# Patient Record
Sex: Male | Born: 1965 | Race: White | Hispanic: No | Marital: Single | Smoking: Never smoker
Health system: Southern US, Community
[De-identification: ages and names within clinical notes are randomized; demographics above are authoritative.]

## PROBLEM LIST (undated history)

## (undated) DIAGNOSIS — K219 Gastro-esophageal reflux disease without esophagitis: Secondary | ICD-10-CM

## (undated) DIAGNOSIS — Z789 Other specified health status: Secondary | ICD-10-CM

## (undated) DIAGNOSIS — N529 Male erectile dysfunction, unspecified: Secondary | ICD-10-CM

## (undated) DIAGNOSIS — E785 Hyperlipidemia, unspecified: Secondary | ICD-10-CM

## (undated) DIAGNOSIS — K5792 Diverticulitis of intestine, part unspecified, without perforation or abscess without bleeding: Secondary | ICD-10-CM

## (undated) DIAGNOSIS — F909 Attention-deficit hyperactivity disorder, unspecified type: Secondary | ICD-10-CM

## (undated) DIAGNOSIS — S3992XA Unspecified injury of lower back, initial encounter: Secondary | ICD-10-CM

## (undated) DIAGNOSIS — IMO0002 Reserved for concepts with insufficient information to code with codable children: Secondary | ICD-10-CM

## (undated) DIAGNOSIS — G4700 Insomnia, unspecified: Secondary | ICD-10-CM

## (undated) HISTORY — DX: Reserved for concepts with insufficient information to code with codable children: IMO0002

## (undated) HISTORY — DX: Hyperlipidemia, unspecified: E78.5

## (undated) HISTORY — DX: Insomnia, unspecified: G47.00

## (undated) HISTORY — DX: Other specified health status: Z78.9

## (undated) HISTORY — PX: KNEE SURGERY: SHX244

## (undated) HISTORY — DX: Diverticulitis of intestine, part unspecified, without perforation or abscess without bleeding: K57.92

## (undated) HISTORY — DX: Male erectile dysfunction, unspecified: N52.9

## (undated) HISTORY — PX: SHOULDER SURGERY: SHX246

## (undated) HISTORY — DX: Unspecified injury of lower back, initial encounter: S39.92XA

## (undated) HISTORY — PX: ESOPHAGOGASTRODUODENOSCOPY: SHX1529

## (undated) HISTORY — DX: Attention-deficit hyperactivity disorder, unspecified type: F90.9

## (undated) HISTORY — DX: Gastro-esophageal reflux disease without esophagitis: K21.9

---

## 2006-10-13 ENCOUNTER — Emergency Department (HOSPITAL_COMMUNITY): Admission: EM | Admit: 2006-10-13 | Discharge: 2006-10-13 | Payer: Self-pay | Admitting: Emergency Medicine

## 2007-05-27 ENCOUNTER — Encounter: Admission: RE | Admit: 2007-05-27 | Discharge: 2007-05-27 | Payer: Self-pay | Admitting: Gastroenterology

## 2007-12-26 ENCOUNTER — Ambulatory Visit (HOSPITAL_COMMUNITY): Admission: RE | Admit: 2007-12-26 | Discharge: 2007-12-26 | Payer: Self-pay | Admitting: Family Medicine

## 2011-03-27 ENCOUNTER — Emergency Department (HOSPITAL_COMMUNITY)
Admission: EM | Admit: 2011-03-27 | Discharge: 2011-03-27 | Disposition: A | Discharge status: Home / Self Care | Payer: 59 | Attending: Emergency Medicine | Admitting: Emergency Medicine

## 2011-03-27 ENCOUNTER — Other Ambulatory Visit: Payer: Self-pay

## 2011-03-27 ENCOUNTER — Ambulatory Visit (HOSPITAL_COMMUNITY)
Admission: RE | Admit: 2011-03-27 | Discharge: 2011-03-27 | Disposition: A | Discharge status: Home / Self Care | Payer: 59 | Source: Ambulatory Visit | Attending: Family Medicine | Admitting: Family Medicine

## 2011-03-27 ENCOUNTER — Emergency Department (HOSPITAL_COMMUNITY): Payer: 59

## 2011-03-27 ENCOUNTER — Other Ambulatory Visit: Payer: Self-pay | Admitting: Family Medicine

## 2011-03-27 DIAGNOSIS — R079 Chest pain, unspecified: Secondary | ICD-10-CM | POA: Insufficient documentation

## 2011-03-27 DIAGNOSIS — M549 Dorsalgia, unspecified: Secondary | ICD-10-CM | POA: Insufficient documentation

## 2011-03-27 LAB — DIFFERENTIAL
Basophils Absolute: 0 10*3/uL (ref 0.0–0.1)
Basophils Relative: 0 % (ref 0–1)
Eosinophils Absolute: 0.2 10*3/uL (ref 0.0–0.7)
Monocytes Absolute: 0.8 10*3/uL (ref 0.1–1.0)
Neutro Abs: 3.6 10*3/uL (ref 1.7–7.7)
Neutrophils Relative %: 57 % (ref 43–77)

## 2011-03-27 LAB — COMPREHENSIVE METABOLIC PANEL
AST: 25 U/L (ref 0–37)
Albumin: 3.8 g/dL (ref 3.5–5.2)
Chloride: 103 mEq/L (ref 96–112)
Creatinine, Ser: 0.78 mg/dL (ref 0.50–1.35)
Total Bilirubin: 0.3 mg/dL (ref 0.3–1.2)
Total Protein: 7.1 g/dL (ref 6.0–8.3)

## 2011-03-27 LAB — CARDIAC PANEL(CRET KIN+CKTOT+MB+TROPI)
Relative Index: 1.8 (ref 0.0–2.5)
Total CK: 378 U/L — ABNORMAL HIGH (ref 7–232)
Troponin I: <0.3 ng/mL (ref <0.30)

## 2011-03-27 LAB — CBC
HCT: 45 % (ref 39.0–52.0)
MCHC: 34 g/dL (ref 30.0–36.0)
RDW: 12.9 % (ref 11.5–15.5)

## 2011-03-27 MED ORDER — PANTOPRAZOLE SODIUM 20 MG PO TBEC: 20.0000 mg | DELAYED_RELEASE_TABLET | Freq: Every day | ORAL | Status: DC

## 2011-03-27 MED ORDER — IOHEXOL 350 MG/ML SOLN
100.0000 mL | Freq: Once | INTRAVENOUS | Status: AC | PRN
  Administered 2011-03-27: 100 mL via INTRAVENOUS

## 2011-03-27 NOTE — ED Notes (Signed)
Lt. Side chest pain for 7 days, mid  upper back pain also for 7 days, pt. Denies any sob n/v, skin is w/d/p, resp. E/ u,   The pain is described as aching and non-radiating

## 2011-03-27 NOTE — ED Notes (Signed)
Pt ambulated to the bathroom and back with no difficulty and no distress

## 2011-03-27 NOTE — ED Notes (Signed)
No previous EKG in our MUSE system

## 2011-03-27 NOTE — ED Provider Notes (Signed)
History     CSN: [id number]  Arrival date & time 03/27/11  1352   First MD Initiated Contact with Patient 03/27/11 1605      Chief Complaint  Patient presents with  . Chest Pain    (Consider location/radiation/quality/duration/timing/severity/associated sxs/prior treatment) Patient is a 45 y.o. male presenting with chest pain. The history is provided by the patient (Patient states that he has been having some chest pain that starts in the back radiates to the front of his chest off and on for a number of days. Patient states that it is not worse with exertion he does not get short of breath does not become sweaty some).  Chest Pain The chest pain began 1 - 2 weeks ago. Chest pain occurs intermittently. The chest pain is resolved. The pain is associated with lifting. At its most intense, the pain is at 2/10. The pain is currently at 0/10. The quality of the pain is described as aching. Radiates to: radiates back to front. Pertinent negatives for primary symptoms include no fever, no fatigue, no cough and no abdominal pain. He tried nothing for the symptoms.  Pertinent negatives for past medical history include no aneurysm and no seizures.     History reviewed. No pertinent past medical history.  History reviewed. No pertinent past surgical history.  History reviewed. No pertinent family history.  History  Substance Use Topics  . Smoking status: Never Smoker   . Smokeless tobacco: Not on file  . Alcohol Use: Yes      Review of Systems  Constitutional: Negative for fever and fatigue.  HENT: Negative for congestion, sinus pressure and ear discharge.   Eyes: Negative for discharge.  Respiratory: Negative for cough.   Cardiovascular: Positive for chest pain.  Gastrointestinal: Negative for abdominal pain and diarrhea.  Genitourinary: Negative for frequency and hematuria.  Musculoskeletal: Negative for back pain.  Skin: Negative for rash.  Neurological: Negative for seizures  and headaches.  Hematological: Negative.   Psychiatric/Behavioral: Negative for hallucinations.    Allergies  Review of patient's allergies indicates no known allergies.  Home Medications   Current Outpatient Rx  Name Route Sig Dispense Refill  . HYDROCODONE-ACETAMINOPHEN 5-325 MG PO TABS Oral Take 1 tablet by mouth every 6 (six) hours as needed. For back pain     . IBUPROFEN 200 MG PO TABS Oral Take 400 mg by mouth every 6 (six) hours as needed. For back pain     . PANTOPRAZOLE SODIUM 20 MG PO TBEC Oral Take 1 tablet (20 mg total) by mouth daily. 30 tablet 0    BP 133/80  Pulse 70  Temp(Src) 98.2 F (36.8 C) (Oral)  Resp 18  Ht 6\' 2"  (1.88 m)  Wt 230 lb (104.327 kg)  BMI 29.53 kg/m2  SpO2 96%  Physical Exam  Constitutional: He is oriented to person, place, and time. He appears well-developed.  HENT:  Head: Normocephalic and atraumatic.  Eyes: Conjunctivae and EOM are normal. No scleral icterus.  Neck: Neck supple. No thyromegaly present.  Cardiovascular: Normal rate and regular rhythm.  Exam reveals no gallop and no friction rub.   No murmur heard. Pulmonary/Chest: No stridor. He has no wheezes. He has no rales. He exhibits no tenderness.  Abdominal: He exhibits no distension. There is no tenderness. There is no rebound.  Musculoskeletal: Normal range of motion. He exhibits no edema.  Lymphadenopathy:    He has no cervical adenopathy.  Neurological: He is oriented to person, place, and time. Coordination  normal.  Skin: No rash noted. No erythema.  Psychiatric: He has a normal mood and affect. His behavior is normal.    ED Course  Procedures (including critical care time)  Labs Reviewed  CARDIAC PANEL(CRET KIN+CKTOT+MB+TROPI) - Abnormal; Notable for the following:    Total CK 378 (*)    CK, MB 6.9 (*)    All other components within normal limits  DIFFERENTIAL - Abnormal; Notable for the following:    Monocytes Relative 13 (*)    All other components within  normal limits  COMPREHENSIVE METABOLIC PANEL - Abnormal; Notable for the following:    BUN 26 (*)    All other components within normal limits  CBC   Dg Chest 2 View  03/27/2011  *RADIOLOGY REPORT*  Clinical Data: Chest pain.  CHEST - 2 VIEW  Comparison: None.  Findings: The heart size is normal.  The lungs are clear.  The patient is status post right shoulder surgery.  The visualized soft tissues and bony thorax are otherwise unremarkable.  IMPRESSION: Negative chest.  Original Report Authenticated By: Jamesetta Orleans. MATTERN, M.D.   Ct Angio Chest W/cm &/or Wo Cm  03/27/2011  *RADIOLOGY REPORT*  Clinical Data:  Rule out aortic aneurysm or dissection upper back pain, chest pain  CT ANGIOGRAPHY CHEST WITH CONTRAST  Technique:  Multidetector CT imaging of the chest was performed using the standard protocol during bolus administration of intravenous contrast.  Multiplanar CT image reconstructions including MIPs were obtained to evaluate the vascular anatomy.  Contrast: OMNIPAQUE IOHEXOL 350 MG/ML IV SOLN  Comparison:   Site of service.  Findings:  Unenhanced images of the thorax shows no atherosclerotic calcifications.  There is no pneumothorax. Central airways are patent.  Sagittal images of the spine are unremarkable.  Sagittal view of the sternum is unremarkable.  Post contrast images shows no evidence of aortic dissection.  Ascending aorta measures 3.1 cm in diameter.  Descending measures 2.3 cm in diameter.  The central pulmonary artery is unremarkable.  Heart size within normal limits.  No pericardial effusion is noted.  Images of the lung parenchyma shows no acute infiltrate or pulmonary edema.  Minimal posterior dependent atelectasis noted.  No mediastinal hematoma or adenopathy.  The visualized upper abdomen is unremarkable.  The visualized upper abdominal aorta is unremarkable.  Review of the MIP images confirms the above findings.  IMPRESSION:  1.  No evidence of aortic dissection.  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  Descending aorta measures 2.3 cm in diameter. 2.  No mediastinal hematoma or adenopathy. 3.  No acute infiltrate or pulmonary edema.  Original Report Authenticated By: Natasha Mead, M.D.     1. Chest pain     Date: 03/27/2011  Rate:88  Rhythm: normal sinus rhythm  QRS Axis: normal  Intervals: normal  ST/T Wave abnormalities: normal  Conduction Disutrbances:none  Narrative Interpretation:   Old EKG Reviewed: none available     MDM  Chest pain non cardiac,  Pud, pleuritis from uri, muscular skeletal        Benny Lennert, MD 03/27/11 1902

## 2011-04-08 ENCOUNTER — Encounter (HOSPITAL_COMMUNITY): Payer: Self-pay | Admitting: Family Medicine

## 2011-04-08 ENCOUNTER — Ambulatory Visit (HOSPITAL_COMMUNITY)
Admission: RE | Admit: 2011-04-08 | Discharge: 2011-04-08 | Disposition: A | Discharge status: Home / Self Care | Payer: 59 | Source: Ambulatory Visit | Attending: Family Medicine | Admitting: Family Medicine

## 2011-04-08 DIAGNOSIS — R079 Chest pain, unspecified: Secondary | ICD-10-CM | POA: Insufficient documentation

## 2011-04-08 NOTE — Progress Notes (Signed)
Stress Lab Nurses Notes - [facility]  Jeiden Daughtridge 04/08/2011  Reason for doing test: Chest Pain Type of test: Regular GTX Nurse performing test: Parke Poisson, RN Nuclear Medicine Tech: Not Applicable Echo Tech: Not Applicable MD performing test: Lubertha South Family MD: Lubertha South Test explained and consent signed: yes IV started: No IV started Symptoms: Fatigue Treatment/Intervention: None Reason test stopped: reached target HR After recovery IV was: No IV Patient to return to Nuc. Med at : NA Patient discharged: Home Patient's Condition upon discharge was: stable Comments: During test peak BP 168/72 & HR 171.  Recovery BP 140/68 & HR 110.  Symptoms resolved in recovery. Erskine Speed T

## 2011-04-14 NOTE — Procedures (Signed)
NAMETAEVYN, HAUSEN             ACCOUNT NO.:  [id number]  MEDICAL RECORD NO.:  [id number]  LOCATION:  [facility]                          FACILITY:  [facility]  PHYSICIAN:  Donna Bernard, M.D.DATE OF BIRTH:  Jun 27, 1965  DATE OF PROCEDURE:  04/08/2011 DATE OF DISCHARGE:  04/08/2011                                 STRESS TEST   INDICATIONS FOR TEST:  Test was performed on this 46 year old gentleman because of atypical chest discomfort.  He had actually been seen in the emergency room, and had had negative cardiac enzymes in telemetry. Stress test was performed for further risk stratification.  Stress test was performed at standard [facility] protocol.  Resting EKG revealed normal sinus rhythm with no significant ST-T changes.  The patient tolerated the first 4 stages quite well.  In fact, he made it to the fifth stage before reaching 95% of his maximum predicted heart rate. At this point, a 0.08 seconds past the J-point, there were no significant ST changes noted.  Of note, the patient had no chest pain. He had a normal hypertensive response as expected.  IMPRESSION:  Negative adequate stress test.  PLAN:  Regular exercise.  No further cardiac workup.  Follow up in the office as needed and as scheduled.     Donna Bernard, M.D.     WSL/MEDQ  D:  04/14/2011  T:  04/14/2011  Job:  [id number]

## 2012-02-12 ENCOUNTER — Ambulatory Visit (INDEPENDENT_AMBULATORY_CARE_PROVIDER_SITE_OTHER): Payer: 59 | Admitting: Urgent Care

## 2012-02-12 ENCOUNTER — Encounter: Payer: Self-pay | Admitting: Urgent Care

## 2012-02-12 VITALS — BP 129/88 | HR 81 | Temp 98.1°F | Ht 74.0 in | Wt 214.8 lb

## 2012-02-12 DIAGNOSIS — K219 Gastro-esophageal reflux disease without esophagitis: Secondary | ICD-10-CM | POA: Insufficient documentation

## 2012-02-12 DIAGNOSIS — R0789 Other chest pain: Secondary | ICD-10-CM

## 2012-02-12 MED ORDER — DEXLANSOPRAZOLE 60 MG PO CPDR: 60.0000 mg | DELAYED_RELEASE_CAPSULE | Freq: Every day | ORAL | Status: DC

## 2012-02-12 NOTE — Progress Notes (Signed)
Referring Provider: Merlyn Albert, MD Primary Care Physician:  Harlow Asa, MD Primary Gastroenterologist:  Dr. Jena Gauss  Chief Complaint  Patient presents with  . Gastrophageal Reflux    Atypical Chest Pain    HPI:  Nicholas Sharp is a 46 y.o. male here as a referral from Dr. Gerda Diss for atypical chest pain & GERD.  Pt gives hx of dx with GERD in 2009.  He has been having a pressure-like chest pain like an MI.  It is exacerbated by eating, drinking, or laying down at night.  Taking 8-9 Rolaids makes it better.  2 mo ago, he went to see Dr Gerda Diss & started carafate & protonix 40mg  daily.  He has previously failed OTC ranitidine, omeprazole, & prilosec in conjunction with protonix.  At times he feels his heart is racing.  He says Dr Gerda Diss ordered a stress test & it was negative.  He is very active as Company secretary, exercises, tries to eat right.  Works 24 hr shifts.  Cut out alcohol & caffeine & made little difference.  Denies dysphagia or odynophagia.  His weight is stable.  His appetite is fine.  He denies constipation, diarrhea, rectal bleeding, melena.  He had an EGD 2009 by Dr Ewing Schlein with benign esophageal biopsy, prepyloric inflammation, & hiatal hernia. He also had ABD [place] 2009 that showed mild diffuse hepatocellular disease, likely fatty infiltration.  LFTs 2012 normal.  Past Medical History  Diagnosis Date  . GERD (gastroesophageal reflux disease)   . DDD (degenerative disc disease)     Past Surgical History  Procedure Date  . Shoulder surgery     x 3  left  . Knee surgery     x 3   right  . Esophagogastroduodenoscopy     Dr Magod-hiatal hernia, normal esophagus & esophageal bx, gastritis (no bx), normal D1/D2    Current Outpatient Prescriptions  Medication Sig Dispense Refill  . HYDROcodone-acetaminophen (NORCO) 5-325 MG per tablet Take 1 tablet by mouth every 6 (six) hours as needed. For back pain      . ibuprofen (ADVIL,MOTRIN) 200 MG tablet Take 400 mg by mouth every 6 (six) hours  as needed. For back pain       . pantoprazole (PROTONIX) 20 MG tablet Take 1 tablet (20 mg total) by mouth daily.  30 tablet  0  . sucralfate (CARAFATE) 1 GM/10ML suspension Take 1 g by mouth 4 (four) times daily.      Marland Kitchen dexlansoprazole (DEXILANT) 60 MG capsule Take 1 capsule (60 mg total) by mouth daily.  14 capsule  0    Allergies as of 02/12/2012  . (No Known Allergies)    Family History:There is no known family history of colorectal carcinoma , liver disease, or inflammatory bowel disease.  Problem Relation Age of Onset  . Diabetes Mother   . Dementia Mother     History   Social History  . Marital Status: Single    Spouse Name: N/A    Number of Children: 1  . Years of Education: N/A   Occupational History  . fireman-    Social History Main Topics  . Smoking status: Never Smoker   . Smokeless tobacco: Not on file  . Alcohol Use: Yes     Comment: several beers/wk  . Drug Use: No  . Sexually Active: Not on file   Other Topics Concern  . Not on file   Social History Narrative   1 son-24 healthyLives alone  Review of Systems: Gen:  Denies any fever, chills, sweats, anorexia, fatigue, weakness, malaise, weight loss, and sleep disorder CV: Denies chest pain, angina, palpitations, syncope, orthopnea, PND, peripheral edema, and claudication. Resp: Denies dyspnea at rest, dyspnea with exercise, cough, sputum, wheezing, coughing up blood, and pleurisy. GI: Denies vomiting blood, jaundice, and fecal incontinence.  GU : Denies urinary burning, blood in urine, urinary frequency, urinary hesitancy, nocturnal urination, and urinary incontinence. MS: Denies joint pain, limitation of movement, and swelling, stiffness, low back pain, extremity pain. Denies muscle weakness, cramps, atrophy.  Derm: Denies rash, itching, dry skin, hives, moles, warts, or unhealing ulcers.  Psych: Denies depression, anxiety, memory loss, suicidal ideation, hallucinations, paranoia, and  confusion. Heme: Denies bruising, bleeding, and enlarged lymph nodes. Neuro:  Denies any headaches, dizziness, paresthesias. Endo:  Denies any problems with DM, thyroid, adrenal function.  Physical Exam: BP 129/88  Pulse 81  Temp 98.1 F (36.7 C) (Temporal)  Ht 6\' 2"  (1.88 m)  Wt 214 lb 12.8 oz (97.433 kg)  BMI 27.58 kg/m2 No LMP for male patient. General:   Alert,  Well-developed, well-nourished, pleasant and cooperative in NAD Head:  Normocephalic and atraumatic. Eyes:  Sclera clear, no icterus.   Conjunctiva pink. Ears:  Normal auditory acuity. Nose:  No deformity, discharge, or lesions. Mouth:  No deformity or lesions,oropharynx pink & moist. Neck:  Supple; no masses or thyromegaly. Lungs:  Clear throughout to auscultation.   No wheezes, crackles, or rhonchi. No acute distress. Heart:  Regular rate and rhythm; no murmurs, clicks, rubs,  or gallops. Abdomen:  Normal bowel sounds.  No bruits.  Soft, non-tender and non-distended without masses, hepatosplenomegaly or hernias noted.  No guarding or rebound tenderness.  Negative Murphy's sign. Rectal:  Deferred. Msk:  Symmetrical without gross deformities. Normal posture. Pulses:  Normal pulses noted. Extremities:  No clubbing or edema. Neurologic:  Alert and oriented x4;  grossly normal neurologically. Skin:  Intact without significant lesions or rashes. Lymph Nodes:  No significant cervical adenopathy. Psych:  Alert and cooperative. Normal mood and affect.

## 2012-02-12 NOTE — Assessment & Plan Note (Addendum)
Nicholas Sharp is a pleasant 46 y.o. male with atypical chest pain & minimal response to multiple PPIs.  EGD by Dr Jena Gauss to look for complicated GERD/esophagitis, h pylori.  Cannot r/o non-GI source.  I have discussed risks & benefits which include, but are not limited to, bleeding, infection, perforation & drug reaction.  The patient agrees with this plan & written consent will be obtained.  Cardiac work-up negative.    Begin Dexilant 60mg  daily Stop protonix & omeprazole Use GERD diet & precautions

## 2012-02-12 NOTE — Patient Instructions (Addendum)
Begin Dexilant 60mg  daily Stop protonix & omeprazole Use GERD diet & precautions EGD (Upper endoscopy) with Dr Jena Gauss as soon as possible Gastroesophageal Reflux Disease, Adult Gastroesophageal reflux disease (GERD) happens when acid from your stomach goes into your food pipe (esophagus). The acid can cause a burning feeling in your chest. Over time, the acid can make small holes (ulcers) in your food pipe.  HOME CARE  Ask your doctor for advice about:  Losing weight.  Quitting smoking.  Alcohol use.  Avoid foods and drinks that make your problems worse. You may want to avoid:  Caffeine and alcohol.  Chocolate.  Mints.  Garlic and onions.  Spicy foods.  Citrus fruits, such as oranges, lemons, or limes.  Foods that contain tomato, such as sauce, chili, salsa, and pizza.  Fried and fatty foods.  Avoid lying down for 3 hours before you go to bed or before you take a nap.  Eat small meals often, instead of large meals.  Wear loose-fitting clothing. Do not wear anything tight around your waist.  Raise (elevate) the head of your bed 6 to 8 inches with wood blocks. Using extra pillows does not help.  Only take medicines as told by your doctor.  Do not take aspirin or ibuprofen. GET HELP RIGHT AWAY IF:   You have pain in your arms, neck, jaw, teeth, or back.  Your pain gets worse or changes.  You feel sick to your stomach (nauseous), throw up (vomit), or sweat (diaphoresis).  You feel short of breath, or you pass out (faint).  Your throw up is green, yellow, black, or looks like coffee grounds or blood.  Your poop (stool) is red, bloody, or black. MAKE SURE YOU:   Understand these instructions.  Will watch your condition.  Will get help right away if you are not doing well or get worse. Document Released: 09/02/2007 Document Revised: 06/08/2011 Document Reviewed: 10/03/2010 [facility] Patient Information 2013 [place], [place]. Diet for Gastroesophageal Reflux  Disease, Adult Reflux (acid reflux) is when acid from your stomach flows up into the esophagus. When acid comes in contact with the esophagus, the acid causes irritation and soreness (inflammation) in the esophagus. When reflux happens often or so severely that it causes damage to the esophagus, it is called gastroesophageal reflux disease (GERD). Nutrition therapy can help ease the discomfort of GERD. FOODS OR DRINKS TO AVOID OR LIMIT  Smoking or chewing tobacco. Nicotine is one of the most potent stimulants to acid production in the gastrointestinal tract.  Caffeinated and decaffeinated coffee and black tea.  Regular or low-calorie carbonated beverages or energy drinks (caffeine-free carbonated beverages are allowed).   Strong spices, such as black pepper, white pepper, red pepper, cayenne, curry powder, and chili powder.  Peppermint or spearmint.  Chocolate.  High-fat foods, including meats and fried foods. Extra added fats including oils, butter, salad dressings, and nuts. Limit these to less than 8 tsp per day.  Fruits and vegetables if they are not tolerated, such as citrus fruits or tomatoes.  Alcohol.  Any food that seems to aggravate your condition. If you have questions regarding your diet, call your caregiver or a registered dietitian. OTHER THINGS THAT MAY HELP GERD INCLUDE:   Eating your meals slowly, in a relaxed setting.  Eating 5 to 6 small meals per day instead of 3 large meals.  Eliminating food for a period of time if it causes distress.  Not lying down until 3 hours after eating a meal.  Keeping the  head of your bed raised 6 to 9 inches (15 to 23 cm) by using a foam wedge or blocks under the legs of the bed. Lying flat may make symptoms worse.  Being physically active. Weight loss may be helpful in reducing reflux in overweight or obese adults.  Wear loose fitting clothing EXAMPLE MEAL PLAN This meal plan is approximately 2,000 calories based on  https://www.bernard.org/ meal planning guidelines. Breakfast   cup cooked oatmeal.  1 cup strawberries.  1 cup low-fat milk.  1 oz almonds. Snack  1 cup cucumber slices.  6 oz yogurt (made from low-fat or fat-free milk). Lunch  2 slice whole-wheat bread.  2 oz sliced [place].  2 tsp mayonnaise.  1 cup blueberries.  1 cup snap peas. Snack  6 whole-wheat crackers.  1 oz string cheese. Dinner   cup brown rice.  1 cup mixed veggies.  1 tsp olive oil.  3 oz grilled fish. Document Released: 03/16/2005 Document Revised: 06/08/2011 Document Reviewed: 01/30/2011 [facility] Patient Information 2013 [place], [place].

## 2012-02-15 ENCOUNTER — Encounter (HOSPITAL_COMMUNITY): Payer: Self-pay | Admitting: Pharmacy Technician

## 2012-02-15 NOTE — Progress Notes (Signed)
Faxed to PCP

## 2012-02-15 NOTE — Progress Notes (Signed)
REVIEWED.  

## 2012-02-16 MED ORDER — SODIUM CHLORIDE 0.45 % IV SOLN: INTRAVENOUS | Status: DC

## 2012-02-17 ENCOUNTER — Encounter (HOSPITAL_COMMUNITY): Payer: Self-pay

## 2012-02-17 ENCOUNTER — Ambulatory Visit (HOSPITAL_COMMUNITY)
Admission: RE | Admit: 2012-02-17 | Discharge: 2012-02-17 | Disposition: A | Discharge status: Home / Self Care | Payer: 59 | Source: Ambulatory Visit | Attending: Internal Medicine | Admitting: Internal Medicine

## 2012-02-17 ENCOUNTER — Encounter (HOSPITAL_COMMUNITY)
Admission: RE | Disposition: A | Discharge status: Home / Self Care | Payer: Self-pay | Source: Ambulatory Visit | Attending: Internal Medicine

## 2012-02-17 DIAGNOSIS — K2100 Gastro-esophageal reflux disease with esophagitis, without bleeding: Secondary | ICD-10-CM

## 2012-02-17 DIAGNOSIS — R0789 Other chest pain: Secondary | ICD-10-CM | POA: Insufficient documentation

## 2012-02-17 DIAGNOSIS — K209 Esophagitis, unspecified without bleeding: Secondary | ICD-10-CM | POA: Insufficient documentation

## 2012-02-17 DIAGNOSIS — K449 Diaphragmatic hernia without obstruction or gangrene: Secondary | ICD-10-CM | POA: Insufficient documentation

## 2012-02-17 DIAGNOSIS — K219 Gastro-esophageal reflux disease without esophagitis: Secondary | ICD-10-CM | POA: Insufficient documentation

## 2012-02-17 HISTORY — PX: ESOPHAGOGASTRODUODENOSCOPY: SHX5428

## 2012-02-17 SURGERY — EGD (ESOPHAGOGASTRODUODENOSCOPY)
Anesthesia: Moderate Sedation

## 2012-02-17 MED ORDER — STERILE WATER FOR IRRIGATION IR SOLN
Status: DC | PRN
  Administered 2012-02-17: 15:00:00

## 2012-02-17 MED ORDER — MEPERIDINE HCL 100 MG/ML IJ SOLN
INTRAMUSCULAR | Status: AC
  Filled 2012-02-17: qty 2

## 2012-02-17 MED ORDER — MEPERIDINE HCL 100 MG/ML IJ SOLN
INTRAMUSCULAR | Status: DC | PRN
  Administered 2012-02-17 (×3): 50 mg via INTRAVENOUS

## 2012-02-17 MED ORDER — MEPERIDINE HCL 100 MG/ML IJ SOLN
INTRAMUSCULAR | Status: AC
  Filled 2012-02-17: qty 1

## 2012-02-17 MED ORDER — MIDAZOLAM HCL 5 MG/5ML IJ SOLN
INTRAMUSCULAR | Status: AC
  Filled 2012-02-17: qty 10

## 2012-02-17 MED ORDER — MIDAZOLAM HCL 5 MG/5ML IJ SOLN
INTRAMUSCULAR | Status: DC | PRN
  Administered 2012-02-17 (×3): 2 mg via INTRAVENOUS

## 2012-02-17 NOTE — Progress Notes (Signed)
Patient had 0.45%NS hanging. Received .

## 2012-02-17 NOTE — Op Note (Signed)
[facility] [address] [place], [address]   ENDOSCOPY PROCEDURE REPORT  PATIENT: Nicholas Sharp, Nicholas Sharp  MR#: [id number] BIRTHDATE: 1966/03/30 , 46  yrs. old GENDER: Male ENDOSCOPIST: R.  Roetta Sessions, MD [facility] REFERRED BY:  Simone Curia, M.D. / Self PROCEDURE DATE:  02/17/2012 PROCEDURE:     Diagnostic EGD  INDICATIONS:     GERD/atypical chest pain-refractory to Protonix and Carafate; prior negative cardiac workup. The above-mentioned symptoms now much improved with a 4 day course of Dexilant thus far.  INFORMED CONSENT:   The risks, benefits, limitations, alternatives and imponderables have been discussed.  The potential for biopsy, esophogeal dilation, etc. have also been reviewed.  Questions have been answered.  All parties agreeable.  Please see the history and physical in the medical record for more information.  MEDICATIONS:  Versed 6 mg IV and Demerol 150 mg IV in divided doses.  DESCRIPTION OF PROCEDURE:   The EG-[id number] ([id number])  endoscope was introduced through the mouth and advanced to the second portion of the duodenum without difficulty or limitations.  The mucosal surfaces were surveyed very carefully during advancement of the scope and upon withdrawal.  Retroflexion view of the proximal stomach and esophagogastric junction was performed.      FINDINGS: Multiple 1-2 mm mucosal breaks at the GE junction. No Barrett's esophagus. Stomach empty. 2 cm hiatal hernia; otherwise, the remainder of the gastric mucosa appeared normal. Pylorus patent. Normal first and second portion of the duodenum.  THERAPEUTIC / DIAGNOSTIC MANEUVERS PERFORMED:   COMPLICATIONS:  None  IMPRESSION: Tiny distal esophageal erosions consistent with mild erosive reflux esophagitis. Small hiatal hernia.  RECOMMENDATIONS:  Continue Dexilant 60 mg orally daily. Office followup with [place] in 4 months.    _______________________________ R. Roetta Sessions, MD FACP  [facility] eSigned:  R. Roetta Sessions, MD FACP [facility] 02/17/2012 3:55 PM     CC:

## 2012-02-17 NOTE — Interval H&P Note (Signed)
History and Physical Interval Note:  02/17/2012 3:25 PM  Nicholas Sharp  has presented today for surgery, with the diagnosis of Atypical Chest Pain and GERD  The various methods of treatment have been discussed with the patient and family. After consideration of risks, benefits and other options for treatment, the patient has consented to  Procedure(s) (LRB) with comments: ESOPHAGOGASTRODUODENOSCOPY (EGD) (N/A) - 3:45 as a surgical intervention .  The patient's history has been reviewed, patient examined, no change in status, stable for surgery.  I have reviewed the patient's chart and labs.  Questions were answered to the patient's satisfaction.     Eula Listen  Patient already much improved on a short course of Dexilant 60 mg orally daily. EGD today per plan.The risks, benefits, limitations, alternatives and imponderables have been reviewed with the patient. Potential for esophageal dilation, biopsy, etc. have also been reviewed.  Questions have been answered. All parties agreeable.

## 2012-02-17 NOTE — H&P (View-Only) (Signed)
Referring Provider: Luking, William S, MD Primary Care Physician:  LUKING,W S, MD Primary Gastroenterologist:  Dr. Rourk  Chief Complaint  Patient presents with  . Gastrophageal Reflux    Atypical Chest Pain    HPI:  Nicholas Sharp is a 46 y.o. male here as a referral from Dr. Luking for atypical chest pain & GERD.  Pt gives hx of dx with GERD in 2009.  He has been having a pressure-like chest pain like an MI.  It is exacerbated by eating, drinking, or laying down at night.  Taking 8-9 Rolaids makes it better.  2 mo ago, he went to see Dr Luking & started carafate & protonix 40mg daily.  He has previously failed OTC ranitidine, omeprazole, & prilosec in conjunction with protonix.  At times he feels his heart is racing.  He says Dr Luking ordered a stress test & it was negative.  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  Cut out alcohol & caffeine & made little difference.  Denies dysphagia or odynophagia.  His weight is stable.  His appetite is fine.  He denies constipation, diarrhea, rectal bleeding, melena.  He had an EGD 2009 by Dr Magod with benign esophageal biopsy, prepyloric inflammation, & hiatal hernia. He also had ABD US 2009 that showed mild diffuse hepatocellular disease, likely fatty infiltration.  LFTs 2012 normal.  Past Medical History  Diagnosis Date  . GERD (gastroesophageal reflux disease)   . DDD (degenerative disc disease)     Past Surgical History  Procedure Date  . Shoulder surgery     x 3  left  . Knee surgery     x 3   right  . Esophagogastroduodenoscopy     Dr Magod-hiatal hernia, normal esophagus & esophageal bx, gastritis (no bx), normal D1/D2    Current Outpatient Prescriptions  Medication Sig Dispense Refill  . HYDROcodone-acetaminophen (NORCO) 5-325 MG per tablet Take 1 tablet by mouth every 6 (six) hours as needed. For back pain      . ibuprofen (ADVIL,MOTRIN) 200 MG tablet Take 400 mg by mouth every 6 (six) hours  as needed. For back pain       . pantoprazole (PROTONIX) 20 MG tablet Take 1 tablet (20 mg total) by mouth daily.  30 tablet  0  . sucralfate (CARAFATE) 1 GM/10ML suspension Take 1 g by mouth 4 (four) times daily.      . dexlansoprazole (DEXILANT) 60 MG capsule Take 1 capsule (60 mg total) by mouth daily.  14 capsule  0    Allergies as of 02/12/2012  . (No Known Allergies)    Family History:There is no known family history of colorectal carcinoma , liver disease, or inflammatory bowel disease.  Problem Relation Age of Onset  . Diabetes Mother   . Dementia Mother     History   Social History  . Marital Status: Single    Spouse Name: N/A    Number of Children: 1  . Years of Education: N/A   Occupational History  . fireman-[place]    Social History Main Topics  . Smoking status: Never Smoker   . Smokeless tobacco: Not on file  . Alcohol Use: Yes     Comment: several beers/wk  . Drug Use: No  . Sexually Active: Not on file   Other Topics Concern  . Not on file   Social History Narrative   1 son-24 healthyLives alone  Review of Systems: Gen:   Denies any fever, chills, sweats, anorexia, fatigue, weakness, malaise, weight loss, and sleep disorder CV: Denies chest pain, angina, palpitations, syncope, orthopnea, PND, peripheral edema, and claudication. Resp: Denies dyspnea at rest, dyspnea with exercise, cough, sputum, wheezing, coughing up blood, and pleurisy. GI: Denies vomiting blood, jaundice, and fecal incontinence.  GU : Denies urinary burning, blood in urine, urinary frequency, urinary hesitancy, nocturnal urination, and urinary incontinence. MS: Denies joint pain, limitation of movement, and swelling, stiffness, low back pain, extremity pain. Denies muscle weakness, cramps, atrophy.  Derm: Denies rash, itching, dry skin, hives, moles, warts, or unhealing ulcers.  Psych: Denies depression, anxiety, memory loss, suicidal ideation, hallucinations, paranoia, and  confusion. Heme: Denies bruising, bleeding, and enlarged lymph nodes. Neuro:  Denies any headaches, dizziness, paresthesias. Endo:  Denies any problems with DM, thyroid, adrenal function.  Physical Exam: BP 129/88  Pulse 81  Temp 98.1 F (36.7 C) (Temporal)  Ht 6' 2" (1.88 m)  Wt 214 lb 12.8 oz (97.433 kg)  BMI 27.58 kg/m2 No LMP for male patient. General:   Alert,  Well-developed, well-nourished, pleasant and cooperative in NAD Head:  Normocephalic and atraumatic. Eyes:  Sclera clear, no icterus.   Conjunctiva pink. Ears:  Normal auditory acuity. Nose:  No deformity, discharge, or lesions. Mouth:  No deformity or lesions,oropharynx pink & moist. Neck:  Supple; no masses or thyromegaly. Lungs:  Clear throughout to auscultation.   No wheezes, crackles, or rhonchi. No acute distress. Heart:  Regular rate and rhythm; no murmurs, clicks, rubs,  or gallops. Abdomen:  Normal bowel sounds.  No bruits.  Soft, non-tender and non-distended without masses, hepatosplenomegaly or hernias noted.  No guarding or rebound tenderness.  Negative Murphy's sign. Rectal:  Deferred. Msk:  Symmetrical without gross deformities. Normal posture. Pulses:  Normal pulses noted. Extremities:  No clubbing or edema. Neurologic:  Alert and oriented x4;  grossly normal neurologically. Skin:  Intact without significant lesions or rashes. Lymph Nodes:  No significant cervical adenopathy. Psych:  Alert and cooperative. Normal mood and affect.  

## 2012-02-19 ENCOUNTER — Encounter (HOSPITAL_COMMUNITY): Payer: Self-pay | Admitting: Internal Medicine

## 2012-06-02 ENCOUNTER — Encounter: Payer: Self-pay | Admitting: *Deleted

## 2012-07-09 ENCOUNTER — Other Ambulatory Visit: Payer: Self-pay | Admitting: Family Medicine

## 2012-07-11 NOTE — Telephone Encounter (Signed)
Needs refill on testosterone inj.  Last chronic visit 07/27/12

## 2012-07-11 NOTE — Telephone Encounter (Signed)
May refill 

## 2012-07-11 NOTE — Telephone Encounter (Signed)
Med called into [facility] [place]

## 2012-08-01 ENCOUNTER — Ambulatory Visit: Payer: Self-pay | Admitting: Family Medicine

## 2012-08-01 ENCOUNTER — Encounter: Payer: Self-pay | Admitting: Family Medicine

## 2012-08-01 ENCOUNTER — Ambulatory Visit (INDEPENDENT_AMBULATORY_CARE_PROVIDER_SITE_OTHER): Payer: 59 | Admitting: Family Medicine

## 2012-08-01 ENCOUNTER — Other Ambulatory Visit: Payer: Self-pay | Admitting: Family Medicine

## 2012-08-01 VITALS — BP 142/88 | Temp 98.0°F | Wt 209.0 lb

## 2012-08-01 DIAGNOSIS — E291 Testicular hypofunction: Secondary | ICD-10-CM

## 2012-08-01 DIAGNOSIS — Z79899 Other long term (current) drug therapy: Secondary | ICD-10-CM

## 2012-08-01 DIAGNOSIS — Z125 Encounter for screening for malignant neoplasm of prostate: Secondary | ICD-10-CM

## 2012-08-01 DIAGNOSIS — E349 Endocrine disorder, unspecified: Secondary | ICD-10-CM

## 2012-08-01 DIAGNOSIS — E785 Hyperlipidemia, unspecified: Secondary | ICD-10-CM

## 2012-08-01 MED ORDER — CEFPROZIL 500 MG PO TABS: 500.0000 mg | ORAL_TABLET | Freq: Two times a day (BID) | ORAL | Status: DC

## 2012-08-01 NOTE — Telephone Encounter (Signed)
wis cannot e-rx

## 2012-08-01 NOTE — Progress Notes (Signed)
  Subjective:    Patient ID: Nicholas Sharp, male    DOB: 1966/01/09, 47 y.o.   MRN: [id number]  Headache  This is a new problem. The current episode started 1 to 4 weeks ago. The problem occurs every few minutes. The problem has been waxing and waning. The pain is located in the bilateral region. The pain does not radiate. The pain quality is not similar to prior headaches. The quality of the pain is described as aching and shooting. The pain is at a severity of 5/10. The pain is moderate. Associated symptoms include rhinorrhea. Pertinent negatives include no facial sweating. Nothing aggravates the symptoms. He has tried NSAIDs (advil two or three prn) for the symptoms. The treatment provided mild relief. There is no history of migraine headaches.   Exercising regularly. Using otc claritin for allergies. Patient notes he has a history of elevated blood pressure. Recent numbers have been good when he checks at work.  Patient on testosterone. He gives himself injections every 3 weeks. No recent blood work. No signs or symptoms of high or low testosterone.  Review of Systems  HENT: Positive for rhinorrhea.   Neurological: Positive for headaches.   ROS otherwise negative    Objective:   Physical Exam  Alert no acute distress. Blood pressure improved on repeat 130/86. HEENT normal. Lungs clear. Heart regular in rhythm. Funduscopic exam normal. Neck supple. Scalp nontender      Assessment & Plan:  Impression headaches patient worried about brain tumor told these are benign and not suggestive at all of brain tumor. #2 low testosterone status uncertain. #3 hypertension decent control. Plan appropriate blood work. Diet exercise iscussed. Symptomatic care discussed. 25 minutes spent most indiscusson.

## 2012-08-02 ENCOUNTER — Encounter: Payer: Self-pay | Admitting: *Deleted

## 2012-08-12 LAB — BASIC METABOLIC PANEL
BUN: 21 mg/dL (ref 6–23)
CO2: 31 mEq/L (ref 19–32)
Calcium: 9.3 mg/dL (ref 8.4–10.5)
Creat: 0.9 mg/dL (ref 0.50–1.35)
Glucose, Bld: 87 mg/dL (ref 70–99)

## 2012-08-12 LAB — LIPID PANEL
Cholesterol: 174 mg/dL (ref 0–200)
HDL: 41 mg/dL (ref >39)
Triglycerides: 92 mg/dL (ref <150)

## 2012-08-12 LAB — HEPATIC FUNCTION PANEL
Albumin: 4.4 g/dL (ref 3.5–5.2)
Total Protein: 6.5 g/dL (ref 6.0–8.3)

## 2012-08-14 ENCOUNTER — Encounter: Payer: Self-pay | Admitting: Family Medicine

## 2012-10-12 ENCOUNTER — Other Ambulatory Visit: Payer: Self-pay | Admitting: Internal Medicine

## 2012-10-12 NOTE — Telephone Encounter (Signed)
He needs f/u OV in 3 months.

## 2012-10-13 NOTE — Telephone Encounter (Signed)
3 month f/u ov reminder made

## 2012-10-19 ENCOUNTER — Other Ambulatory Visit: Payer: Self-pay | Admitting: Family Medicine

## 2012-10-19 NOTE — Telephone Encounter (Signed)
Last seen 08/01/12.

## 2012-10-19 NOTE — Telephone Encounter (Signed)
May ref plus 2 ref 

## 2012-11-22 ENCOUNTER — Encounter: Payer: Self-pay | Admitting: Family Medicine

## 2012-11-22 ENCOUNTER — Ambulatory Visit (INDEPENDENT_AMBULATORY_CARE_PROVIDER_SITE_OTHER): Payer: 59 | Admitting: Family Medicine

## 2012-11-22 VITALS — BP 130/80 | Temp 97.5°F | Ht 74.0 in | Wt 209.4 lb

## 2012-11-22 DIAGNOSIS — J209 Acute bronchitis, unspecified: Secondary | ICD-10-CM

## 2012-11-22 MED ORDER — CLARITHROMYCIN 500 MG PO TABS: 500.0000 mg | ORAL_TABLET | Freq: Two times a day (BID) | ORAL | Status: AC

## 2012-11-22 NOTE — Progress Notes (Signed)
  Subjective:    Patient ID: Nicholas Sharp, male    DOB: 1965-06-18, 47 y.o.   MRN: [id number]  Cough This is a new problem. The current episode started in the past 7 days. The problem has been gradually worsening. The cough is productive of purulent sputum. Associated symptoms include chills, ear congestion, a fever and nasal congestion. Pertinent negatives include no headaches. Nothing aggravates the symptoms. The treatment provided moderate relief.   Prod cough, worse in the morning, productive at times   Review of Systems  Constitutional: Positive for fever and chills.  Respiratory: Positive for cough.   Neurological: Negative for headaches.       Objective:   Physical Exam  Alert mild malaise. Pharynx erythematous neck supple. Intermittent cough during exam heart regular rate rhythm bronchial cough noted no crackles no wheezes      Assessment & Plan:  Impression acute pharyngitis and bronchitis. Plan Biaxin twice a day 10 days. Symptomatic care discussed. Hycodan when necessary for cough. Of note macrolide given because of the others in an extended community with walking pneumonia. WSL

## 2012-12-16 ENCOUNTER — Other Ambulatory Visit: Payer: Self-pay | Admitting: Family Medicine

## 2012-12-19 NOTE — Telephone Encounter (Signed)
wis plus 6 ref

## 2012-12-28 ENCOUNTER — Encounter: Payer: Self-pay | Admitting: Internal Medicine

## 2012-12-29 ENCOUNTER — Ambulatory Visit (INDEPENDENT_AMBULATORY_CARE_PROVIDER_SITE_OTHER): Payer: 59 | Admitting: Family Medicine

## 2012-12-29 ENCOUNTER — Encounter: Payer: Self-pay | Admitting: Family Medicine

## 2012-12-29 VITALS — BP 132/90 | Temp 98.1°F | Ht 74.0 in | Wt 205.0 lb

## 2012-12-29 DIAGNOSIS — K219 Gastro-esophageal reflux disease without esophagitis: Secondary | ICD-10-CM

## 2012-12-29 DIAGNOSIS — E291 Testicular hypofunction: Secondary | ICD-10-CM

## 2012-12-29 DIAGNOSIS — L299 Pruritus, unspecified: Secondary | ICD-10-CM

## 2012-12-29 DIAGNOSIS — R7989 Other specified abnormal findings of blood chemistry: Secondary | ICD-10-CM

## 2012-12-29 DIAGNOSIS — E349 Endocrine disorder, unspecified: Secondary | ICD-10-CM

## 2012-12-29 DIAGNOSIS — Z79899 Other long term (current) drug therapy: Secondary | ICD-10-CM

## 2012-12-29 DIAGNOSIS — Z0000 Encounter for general adult medical examination without abnormal findings: Secondary | ICD-10-CM

## 2012-12-29 DIAGNOSIS — E785 Hyperlipidemia, unspecified: Secondary | ICD-10-CM

## 2012-12-29 MED ORDER — TESTOSTERONE CYPIONATE 200 MG/ML IM SOLN: INTRAMUSCULAR | Status: DC

## 2012-12-29 NOTE — Progress Notes (Signed)
  Subjective:    Patient ID: Nicholas Sharp, male    DOB: 1965-05-13, 47 y.o.   MRN: [id number]  HPIneck itching for 2 weeks. Can't sleep at night due to the itching. Has had this before years ago. Has tried otc. No major rash.  Some itching., Use prescription cream in the past which definitely seemed to help.  Stress significant--a lot going on, sister had a heart attack recently, on the go with a lot of stuff.    Discuss testosterone injection. Using the injections regularly. Feels like it is currently still helping him. Needs refills on this. No levels checked since the spring.  Patient worried about his cholesterol in light of his sister recently having a heart attack.  Reflexes generally stable.    Review of Systems no chest pain no back pain no abdominal pain ROS otherwise negative    Objective:   Physical Exam  Alert no acute distress. Lungs clear. Heart regular in rhythm. H&T posterior scalp couple small papules. No distinct rash.      Assessment & Plan:  Impression #1 scalp pruritus possibly related to #2 #2 increased stress. #3 low testosterone status uncertain. #4 hyperlipidemia status uncertain. #5 family history now of heart attack and sibling discussed with patient. Plan 25 minutes spent most in discussion. Triamcinolone cream. Appropriate blood work. Injections refilled. Diet exercise discussed. WSL

## 2013-03-02 ENCOUNTER — Telehealth: Payer: Self-pay | Admitting: Family Medicine

## 2013-03-02 ENCOUNTER — Other Ambulatory Visit: Payer: Self-pay | Admitting: *Deleted

## 2013-03-02 MED ORDER — ALPRAZOLAM 0.5 MG PO TABS: ORAL_TABLET | ORAL | Status: DC

## 2013-03-02 NOTE — Telephone Encounter (Signed)
[facility] in [place]. Pt notified.

## 2013-03-02 NOTE — Telephone Encounter (Signed)
Patient has experienced a family loss and is experiencing a lot of anxiety. He is hoping to have an Rx for xanax(because it has worked in the past) called in to [facility]. I told him he may need an appointment. [facility]

## 2013-03-02 NOTE — Telephone Encounter (Signed)
Xanax .5 thirty one po q 6 prn anxiety, no ref, drowsy prec

## 2013-03-17 ENCOUNTER — Telehealth: Payer: Self-pay | Admitting: Family Medicine

## 2013-03-17 MED ORDER — DIPHENOXYLATE-ATROPINE 2.5-0.025 MG PO TABS: 1.0000 | ORAL_TABLET | Freq: Four times a day (QID) | ORAL | Status: DC | PRN

## 2013-03-17 NOTE — Telephone Encounter (Signed)
Medication faxed to pharmacy. Patient was notified.  

## 2013-03-17 NOTE — Telephone Encounter (Signed)
Pt has had diarrhea x 2 days, not abd pains associated with it, no fever but felt a little achy yesterday but not today. Wants to know if we can call him in something for the diarrhea?  [facility] [place]

## 2013-03-17 NOTE — Telephone Encounter (Signed)
Lomotil 24 one q 6 prn will need to fax

## 2013-03-21 ENCOUNTER — Telehealth: Payer: Self-pay | Admitting: Family Medicine

## 2013-03-21 MED ORDER — HYDROCODONE-HOMATROPINE 5-1.5 MG/5ML PO SYRP: 5.0000 mL | ORAL_SOLUTION | Freq: Every evening | ORAL | Status: DC | PRN

## 2013-03-21 NOTE — Telephone Encounter (Signed)
4 oz one tspn qhs prn cough

## 2013-03-21 NOTE — Telephone Encounter (Signed)
Progress note says he got Hycodan 11/22/12  - will need directions if we are refilling.

## 2013-03-21 NOTE — Telephone Encounter (Signed)
Patient needs Rx for the cough syrup that he was given a few months ago. He said he has a bad cough at night.   [facility]

## 2013-03-21 NOTE — Telephone Encounter (Signed)
Rx printed and up front for patient pick up. Patient notified. 

## 2013-04-24 ENCOUNTER — Other Ambulatory Visit: Payer: Self-pay | Admitting: Family Medicine

## 2013-04-24 NOTE — Telephone Encounter (Signed)
Ok plus 3 ref 

## 2013-04-24 NOTE — Telephone Encounter (Signed)
Last office visit 12-29-12

## 2013-04-28 ENCOUNTER — Telehealth: Payer: Self-pay | Admitting: Family Medicine

## 2013-04-28 MED ORDER — AMPHETAMINE-DEXTROAMPHETAMINE 20 MG PO TABS: 20.0000 mg | ORAL_TABLET | Freq: Two times a day (BID) | ORAL | Status: DC

## 2013-04-28 NOTE — Telephone Encounter (Signed)
Patient needs Rx for Adderall. °

## 2013-04-28 NOTE — Telephone Encounter (Signed)
Ok times one mo--not seen since early oct, rec ov before another rx

## 2013-04-28 NOTE — Telephone Encounter (Signed)
Pt would like to restart adderall. States he usually starts back taking in January and that you know about his situation.

## 2013-05-01 NOTE — Telephone Encounter (Signed)
Patient notified

## 2013-05-04 ENCOUNTER — Encounter: Payer: Self-pay | Admitting: Family Medicine

## 2013-05-04 ENCOUNTER — Ambulatory Visit (INDEPENDENT_AMBULATORY_CARE_PROVIDER_SITE_OTHER): Payer: 59 | Admitting: Family Medicine

## 2013-05-04 VITALS — BP 134/98 | Ht 74.0 in | Wt 220.2 lb

## 2013-05-04 DIAGNOSIS — I10 Essential (primary) hypertension: Secondary | ICD-10-CM

## 2013-05-04 DIAGNOSIS — Z79899 Other long term (current) drug therapy: Secondary | ICD-10-CM

## 2013-05-04 DIAGNOSIS — E291 Testicular hypofunction: Secondary | ICD-10-CM

## 2013-05-04 DIAGNOSIS — F909 Attention-deficit hyperactivity disorder, unspecified type: Secondary | ICD-10-CM

## 2013-05-04 DIAGNOSIS — E349 Endocrine disorder, unspecified: Secondary | ICD-10-CM

## 2013-05-04 DIAGNOSIS — E785 Hyperlipidemia, unspecified: Secondary | ICD-10-CM

## 2013-05-04 MED ORDER — ENALAPRIL MALEATE 5 MG PO TABS: 5.0000 mg | ORAL_TABLET | Freq: Every day | ORAL | Status: DC

## 2013-05-04 NOTE — Progress Notes (Signed)
   Subjective:    Patient ID: Nicholas Sharp, male    DOB: 03/07/1966, 48 y.o.   MRN: [id number]  HPI Patient is here today because he is concerned he may have high blood pressure. His readings have been running high and his face stays flushed all the time. He also has pain,burning and pressure around his eyes. This has been present for over 1 year now.   BP not ck'rd elsewhere  Flushing at times, some headache  Sluggish in aft for about a yr or so  Reg exercise puny none  incr stress lately, lot of stress,  Reflux stable,  Patient arrives office with numerous concerns and lengthy discussions regarding them.  Long history of elevated blood pressure. Trying to watch his diet.  Admits to excessive alcohol intake.  Intermittent burning impression the eyes. Has not seen an eye doctor for quite some time. Tried a trial of Claritin and it did not help.  Reports occasional use of ADHD medicine particularly when having a focus for tests Melvern Bankeret cetera.  Reports regular use of testosterone. No obvious side effects. Did not get blood work is requested before.  Review of Systems Did chest pain no back pain no abdominal pain no change in bowel habits no blood in stool ROS otherwise negative    Objective:   Physical Exam  Alert HEENT normal blood pressure repeat 142/94 lungs clear. Heart regular in rhythm. Thyroid nonpalpable. Reflexes intact. Ankles without edema.      Assessment & Plan:  Impression #1 reflux discussed ongoing. #2 high concerns advised to see an oncologist. #3 hypo-testosterone is him discussed at length. Need to get the blood work. #4 hypertension type or treatment discuss. #5 ADHD ongoing medication prescribed. Plan 40 minutes spent most in discussion. Diet exercise discussed. Encouraged to cut down alcohol intake. Add Vasotec 5 each bedtime. See an eye doctor. Get the blood work. Further recommendations based results. WSL

## 2013-06-05 ENCOUNTER — Ambulatory Visit (INDEPENDENT_AMBULATORY_CARE_PROVIDER_SITE_OTHER): Payer: 59 | Admitting: Family Medicine

## 2013-06-05 ENCOUNTER — Encounter: Payer: Self-pay | Admitting: Family Medicine

## 2013-06-05 VITALS — BP 130/84 | Ht 74.0 in | Wt 218.4 lb

## 2013-06-05 DIAGNOSIS — R21 Rash and other nonspecific skin eruption: Secondary | ICD-10-CM

## 2013-06-05 LAB — BASIC METABOLIC PANEL
BUN: 20 mg/dL (ref 6–23)
CALCIUM: 9.1 mg/dL (ref 8.4–10.5)
CO2: 31 meq/L (ref 19–32)
CREATININE: 0.8 mg/dL (ref 0.50–1.35)
Chloride: 101 mEq/L (ref 96–112)
GLUCOSE: 89 mg/dL (ref 70–99)
Potassium: 4.3 mEq/L (ref 3.5–5.3)
SODIUM: 137 meq/L (ref 135–145)

## 2013-06-05 LAB — LIPID PANEL
CHOL/HDL RATIO: 5.4 ratio
CHOLESTEROL: 214 mg/dL — AB (ref 0–200)
HDL: 40 mg/dL (ref >39)
LDL Cholesterol: 127 mg/dL — ABNORMAL HIGH (ref 0–99)
Triglycerides: 236 mg/dL — ABNORMAL HIGH (ref <150)
VLDL: 47 mg/dL — ABNORMAL HIGH (ref 0–40)

## 2013-06-05 LAB — HEPATIC FUNCTION PANEL
ALBUMIN: 4.3 g/dL (ref 3.5–5.2)
ALK PHOS: 58 U/L (ref 39–117)
ALT: 64 U/L — AB (ref 0–53)
AST: 49 U/L — AB (ref 0–37)
BILIRUBIN DIRECT: 0.1 mg/dL (ref 0.0–0.3)
BILIRUBIN TOTAL: 0.6 mg/dL (ref 0.2–1.2)
Indirect Bilirubin: 0.5 mg/dL (ref 0.2–1.2)
Total Protein: 6.9 g/dL (ref 6.0–8.3)

## 2013-06-05 MED ORDER — HYDROCORTISONE 2.5 % EX CREA: TOPICAL_CREAM | Freq: Two times a day (BID) | CUTANEOUS | Status: DC

## 2013-06-05 NOTE — Progress Notes (Signed)
   Subjective:    Patient ID: Nicholas Sharp, male    DOB: 10/02/1965, 48 y.o.   MRN: [id number]  HPI Patient arrives for redness or rash on chest and stomach off and on for a year. Worse this year and having redness in face now also.  Patient states rash is annoying  Notes he also has had an eruption of acne since starting the testosterone. Days. Also rash on the  Review of Systems No fever no chills no headache no back pain no change in bowel habits ROS otherwise    Objective:   Physical Exam  Alert hydration good. Lungs clear. Heart regular in rhythm. H&T normal. Face diffusely flushed. Trunk multiple areas of acne diffuse flushing of chest with mild rash associated      Assessment & Plan:  Impression primarily flushing days ago dilatory rash. Sedation from both the testosterone and enalapril discussed plan await testosterone levels a strong earlier today. Further recommendations based results. WSL

## 2013-06-06 LAB — TESTOSTERONE: TESTOSTERONE: 476 ng/dL (ref 300–890)

## 2013-06-09 ENCOUNTER — Telehealth: Payer: Self-pay | Admitting: Family Medicine

## 2013-06-09 NOTE — Telephone Encounter (Signed)
Patient calling to get blood work results.   

## 2013-06-12 MED ORDER — VERAPAMIL HCL ER 180 MG PO TBCR: 180.0000 mg | EXTENDED_RELEASE_TABLET | Freq: Every day | ORAL | Status: DC

## 2013-06-12 NOTE — Addendum Note (Signed)
Addended by: Margaretha SheffieldBROWN, Niomi Valent S on: 06/12/2013 10:42 AM   Modules accepted: Orders

## 2013-06-12 NOTE — Addendum Note (Signed)
Addended by: Margaretha SheffieldBROWN, Karlton Maya S on: 06/12/2013 10:40 AM   Modules accepted: Orders

## 2013-06-12 NOTE — Telephone Encounter (Signed)
Nurse gave patient results

## 2013-07-12 ENCOUNTER — Other Ambulatory Visit: Payer: Self-pay | Admitting: Family Medicine

## 2013-07-12 NOTE — Telephone Encounter (Signed)
May ref times 6 

## 2013-07-13 ENCOUNTER — Telehealth: Payer: Self-pay | Admitting: Family Medicine

## 2013-07-13 MED ORDER — ALPRAZOLAM 0.5 MG PO TABS: 0.5000 mg | ORAL_TABLET | Freq: Four times a day (QID) | ORAL | Status: DC | PRN

## 2013-07-13 NOTE — Telephone Encounter (Signed)
May ref 

## 2013-07-13 NOTE — Telephone Encounter (Signed)
Patient is requesting a refill on his xanaxs. His mother is not expected to live too much longer and he thinks these will really help him out the next few days.  [facility]

## 2013-07-13 NOTE — Telephone Encounter (Signed)
Rx faxed to pharmacy. Patient notified. 

## 2013-07-13 NOTE — Telephone Encounter (Signed)
Patient not on Xanax per med list need strength and directions please.

## 2013-07-13 NOTE — Telephone Encounter (Signed)
Xanax .5 twenty eight one q 6 hrs prn , drowsy prec

## 2013-07-18 ENCOUNTER — Ambulatory Visit (INDEPENDENT_AMBULATORY_CARE_PROVIDER_SITE_OTHER): Payer: 59 | Admitting: Family Medicine

## 2013-07-18 ENCOUNTER — Encounter: Payer: Self-pay | Admitting: Family Medicine

## 2013-07-18 ENCOUNTER — Ambulatory Visit: Payer: 59 | Admitting: Family Medicine

## 2013-07-18 VITALS — BP 122/88 | Ht 74.0 in | Wt 217.0 lb

## 2013-07-18 DIAGNOSIS — F528 Other sexual dysfunction not due to a substance or known physiological condition: Secondary | ICD-10-CM

## 2013-07-18 DIAGNOSIS — K219 Gastro-esophageal reflux disease without esophagitis: Secondary | ICD-10-CM

## 2013-07-18 DIAGNOSIS — I10 Essential (primary) hypertension: Secondary | ICD-10-CM

## 2013-07-18 DIAGNOSIS — E349 Endocrine disorder, unspecified: Secondary | ICD-10-CM

## 2013-07-18 DIAGNOSIS — E291 Testicular hypofunction: Secondary | ICD-10-CM

## 2013-07-18 DIAGNOSIS — F909 Attention-deficit hyperactivity disorder, unspecified type: Secondary | ICD-10-CM

## 2013-07-18 DIAGNOSIS — R21 Rash and other nonspecific skin eruption: Secondary | ICD-10-CM

## 2013-07-18 MED ORDER — METRONIDAZOLE 1 % EX GEL: CUTANEOUS | Status: DC

## 2013-07-18 MED ORDER — ALPRAZOLAM 1 MG PO TABS: 1.0000 mg | ORAL_TABLET | Freq: Four times a day (QID) | ORAL | Status: DC | PRN

## 2013-07-18 NOTE — Patient Instructions (Signed)
Hold off on blood pressure medicine for now  We will work promptly on referral to dermatologist  Hold off on further testosterone shots for now

## 2013-07-18 NOTE — Progress Notes (Signed)
   Subjective:    Patient ID: Nicholas Sharp, male    DOB: 04/08/1965, 48 y.o.   MRN: [id number]  HPI Patient is here today for a f/u on his rash. He was seen here on 3/9 for same symptoms.  He said the rash is getting worst.   It is not itchy.  It is spreading to his chest, shoulder, and now face.   Pt wanted to let you know that his mom was just dx with MRSA, had it as a complication next  Patient has had significant anxiety since his mother just passed away this past week.  Patient states he has been off the blood pressure medication for the last few days. He notes change her blood pressure medicine did not help his rash at all.  Patient has had difficulty with alcohol abuse. He's worked on cutting down recently.  Patient has been on testosterone supplement for quite some time, has never developed a rash with it. Now he wonders if it is associated.  [phone]  Review of Systems No chest pain no headache no back pain no abdominal pain no change in bowel habits no blood in stool    Objective:   Physical Exam  Alert no acute distress. Lungs clear. Heart regular rate and rhythm. HEENT normal. Face and upper trunk impressive erythematous rash. Some nodular components in the face. Some folliculitis-type eruptions on the chest      Assessment & Plan:  Impression rash becoming chronic. Wonder if associated with testosterone. Quite possible discussed at length also wonder about rosacea equivalent with history of significant alcohol abuse. #2 hypertension decent control off meds. Discussed. #3 testosterone supplement Tacey RuizLeah states that discussed at length plan dermatology referral. Hold off on blood pressure medicine. Hold off on next testosterone shot due in 3 weeks. Recheck in 6 weeks testosterone level one week before then. WSL

## 2013-08-03 ENCOUNTER — Telehealth: Payer: Self-pay | Admitting: Family Medicine

## 2013-08-03 MED ORDER — METRONIDAZOLE 0.75 % EX GEL: 1.0000 "application " | Freq: Two times a day (BID) | CUTANEOUS | Status: DC

## 2013-08-03 NOTE — Telephone Encounter (Signed)
Pt's METRONIDAZOLE Topical 1% gel is not covered by plan, plan prefers METRONIDAZOLE gel 0.75% Janyth Contes(Finacea), please see denial from pharmacy, please advise

## 2013-08-03 NOTE — Telephone Encounter (Signed)
Medication sent to pharmacy  

## 2013-08-03 NOTE — Telephone Encounter (Signed)
Nurses please call in the medication that is preferred by his insurance plan apply twice daily may have a month's supply with 6 refills

## 2013-09-04 ENCOUNTER — Telehealth: Payer: Self-pay | Admitting: Family Medicine

## 2013-09-04 MED ORDER — ALPRAZOLAM 1 MG PO TABS: 1.0000 mg | ORAL_TABLET | Freq: Four times a day (QID) | ORAL | Status: DC | PRN

## 2013-09-04 NOTE — Telephone Encounter (Signed)
Med sent to pharm. Pt notified.  

## 2013-09-04 NOTE — Telephone Encounter (Signed)
He may refill this +3 additional refills

## 2013-09-04 NOTE — Telephone Encounter (Signed)
ALPRAZolam (XANAX) 1 MG tablet  Pt needs refill on his xanax as soon as possible He is currently having difficulty sleeping without it  Last prescribed/seen 07/18/13

## 2013-09-23 LAB — TESTOSTERONE: TESTOSTERONE: 260 ng/dL — AB (ref 300–890)

## 2013-10-10 ENCOUNTER — Other Ambulatory Visit: Payer: Self-pay | Admitting: Family Medicine

## 2013-10-10 NOTE — Telephone Encounter (Signed)
May do so +2 additional refills

## 2013-10-10 NOTE — Telephone Encounter (Signed)
Last seen 07/18/13 

## 2013-10-27 ENCOUNTER — Encounter: Payer: Self-pay | Admitting: Family Medicine

## 2013-10-27 ENCOUNTER — Ambulatory Visit (INDEPENDENT_AMBULATORY_CARE_PROVIDER_SITE_OTHER): Payer: 59 | Admitting: Family Medicine

## 2013-10-27 VITALS — BP 128/86 | Ht 74.0 in | Wt 222.0 lb

## 2013-10-27 DIAGNOSIS — E349 Endocrine disorder, unspecified: Secondary | ICD-10-CM

## 2013-10-27 DIAGNOSIS — E291 Testicular hypofunction: Secondary | ICD-10-CM

## 2013-10-27 DIAGNOSIS — I10 Essential (primary) hypertension: Secondary | ICD-10-CM

## 2013-10-27 DIAGNOSIS — F909 Attention-deficit hyperactivity disorder, unspecified type: Secondary | ICD-10-CM

## 2013-10-27 DIAGNOSIS — F902 Attention-deficit hyperactivity disorder, combined type: Secondary | ICD-10-CM

## 2013-10-27 DIAGNOSIS — R519 Headache, unspecified: Secondary | ICD-10-CM

## 2013-10-27 DIAGNOSIS — L719 Rosacea, unspecified: Secondary | ICD-10-CM

## 2013-10-27 DIAGNOSIS — K219 Gastro-esophageal reflux disease without esophagitis: Secondary | ICD-10-CM

## 2013-10-27 DIAGNOSIS — R51 Headache: Secondary | ICD-10-CM

## 2013-10-27 MED ORDER — TRIAMCINOLONE ACETONIDE 0.1 % EX CREA: 1.0000 "application " | TOPICAL_CREAM | Freq: Two times a day (BID) | CUTANEOUS | Status: DC

## 2013-10-27 NOTE — Progress Notes (Signed)
   Subjective:    Patient ID: Nicholas Sharp, male    DOB: 01/08/1966, 48 y.o.   MRN: [id number]  Hypertension This is a chronic problem. The current episode started more than 1 year ago. Risk factors for coronary artery disease include male gender.   Exercise regular, not taking the bp med   Right arm itching soon. Right arm  Saw the dermatologist dx'ed with rosacea. Gave sig abx. Worsen with sweating.   cont'd red spots on the abdomen,  Went to eye doc, pressure and pain and discpomfort... Felt that this was useless  Washed eyelids   Daily hits eyes and frontal head. Miserable with this, concern he may have sinus difficulties. Would like to see her nose and throat specialist  History of testosterone supplementation. Patient's been on for quite some time. See prior notes.  Review of Systems No chest pain no back pain no abdominal pain no change in bowel habits ROS otherwise    Objective:   Physical Exam  Alert no apparent distress. Lungs clear. Heart rare in rhythm. H&T and normal. Neck supple fundi discharge TMs normal. Skin maculopapular eruption on cheeks anterior chest      Assessment & Plan:  Impression 1 rosacea per dermatologist #2 hypertension good control without meds #3 testosterone supplement very long discussion held including recent studies. Patient wishes to hold off at this time. #4 ADHD stable #5 sinus concerns plan ENT referral. Hold off on testosterone. Of note blood pressure good without medicine. Diet exercise discussed. Followup as scheduled. 35-40 minutes spent most in discussion WSL

## 2013-10-29 DIAGNOSIS — L719 Rosacea, unspecified: Secondary | ICD-10-CM | POA: Insufficient documentation

## 2013-11-08 ENCOUNTER — Other Ambulatory Visit: Payer: Self-pay | Admitting: *Deleted

## 2013-11-08 ENCOUNTER — Telehealth: Payer: Self-pay | Admitting: Family Medicine

## 2013-11-08 NOTE — Telephone Encounter (Signed)
appt made with Dr. Avel Sensoreoh's PA august 20th at 9:30 am at 's office. Pt notified of appt.

## 2013-11-08 NOTE — Telephone Encounter (Signed)
Patient calling to check on referral to Dr. Suszanne Connerseoh for sinus headache.

## 2013-11-08 NOTE — Telephone Encounter (Signed)
Nurses, call today and let's do, ispent an enormous time covering many issues and apparently did not place this in ordeers

## 2013-11-17 ENCOUNTER — Encounter: Payer: Self-pay | Admitting: *Deleted

## 2013-11-21 ENCOUNTER — Other Ambulatory Visit (INDEPENDENT_AMBULATORY_CARE_PROVIDER_SITE_OTHER): Payer: Self-pay | Admitting: Otolaryngology

## 2013-11-21 DIAGNOSIS — J320 Chronic maxillary sinusitis: Secondary | ICD-10-CM

## 2013-11-29 ENCOUNTER — Other Ambulatory Visit: Payer: 59

## 2013-12-05 ENCOUNTER — Ambulatory Visit
Admission: RE | Admit: 2013-12-05 | Discharge: 2013-12-05 | Disposition: A | Discharge status: Home / Self Care | Payer: 59 | Source: Ambulatory Visit | Attending: Otolaryngology | Admitting: Otolaryngology

## 2013-12-05 DIAGNOSIS — J320 Chronic maxillary sinusitis: Secondary | ICD-10-CM

## 2013-12-11 ENCOUNTER — Encounter: Payer: Self-pay | Admitting: Family Medicine

## 2013-12-11 ENCOUNTER — Ambulatory Visit (INDEPENDENT_AMBULATORY_CARE_PROVIDER_SITE_OTHER): Payer: 59 | Admitting: Family Medicine

## 2013-12-11 VITALS — BP 116/88 | HR 72 | Temp 97.9°F | Resp 14 | Ht 74.0 in | Wt 224.0 lb

## 2013-12-11 DIAGNOSIS — R945 Abnormal results of liver function studies: Principal | ICD-10-CM

## 2013-12-11 DIAGNOSIS — E781 Pure hyperglyceridemia: Secondary | ICD-10-CM

## 2013-12-11 DIAGNOSIS — G4700 Insomnia, unspecified: Secondary | ICD-10-CM | POA: Insufficient documentation

## 2013-12-11 DIAGNOSIS — R7989 Other specified abnormal findings of blood chemistry: Secondary | ICD-10-CM

## 2013-12-11 DIAGNOSIS — Z789 Other specified health status: Secondary | ICD-10-CM | POA: Insufficient documentation

## 2013-12-11 DIAGNOSIS — E291 Testicular hypofunction: Secondary | ICD-10-CM

## 2013-12-11 MED ORDER — ALPRAZOLAM 1 MG PO TABS: ORAL_TABLET | ORAL | Status: DC

## 2013-12-11 NOTE — Progress Notes (Signed)
Subjective:    Patient ID: Nicholas Sharp, male    DOB: 08-26-1965, 48 y.o.   MRN: [id number]  HPI Patient is a very pleasant 48 year old white male who works as a Company secretary. I reviewed his most recent lab work from July. It was significant for mild elevations in his liver function tests. His AST and ALT were elevated in the 40s and 60s respectively. He also had elevated triglycerides greater than 358. I reviewed his past medical records which included an ultrasound of the abdomen performed at 2009 which showed mild diffuse hepatocellular disease consistent with fatty infiltration. Patient states that he drinks anywhere from 12-18 beers a day when he is not at work. His Cage questionnaire is completely negative. He does not believe that he has a problem with alcohol abuse. He is concerned that his liver function tests are slightly elevated. He also has a history of rosacea. He also has elevated triglycerides which could be due to his excessive alcohol use. He also reports a dark daily headache which is located above and behind the eyes. He recently had a CT scan of the sinuses which revealed mucous retention cyst of the maxillary sinus but was otherwise normal. The headaches are nonpulsatile. They tend to be worse later in the day. There is no photophobia or phonophobia. There no other neurologic deficits associated with the headaches. Past Medical History  Diagnosis Date  . GERD (gastroesophageal reflux disease)   . DDD (degenerative disc disease)   . ADHD (attention deficit hyperactivity disorder)   . Hyperlipidemia   . ED (erectile dysfunction)   . Insomnia   . Hypertension   . Reflux   . Anxiety   . Insomnia   . Alcohol consumption heavy    Past Surgical History  Procedure Laterality Date  . Shoulder surgery      x 3  left  . Knee surgery      x 3   right  . Esophagogastroduodenoscopy      Dr Magod-hiatal hernia, normal esophagus & esophageal bx, gastritis (no bx), normal D1/D2  .  Esophagogastroduodenoscopy  02/17/2012    Procedure: ESOPHAGOGASTRODUODENOSCOPY (EGD);  Surgeon: Corbin Ade, MD;  Location: [facility];  Service: Endoscopy;  Laterality: N/A;  3:45   No current outpatient prescriptions on file prior to visit.   No current facility-administered medications on file prior to visit.   No Known Allergies History   Social History  . Marital Status: Single    Spouse Name: N/A    Number of Children: 1  . 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 Smoking status: Never Smoker   . Smokeless tobacco: Never Used  . Alcohol Use: Yes     Comment: several beers/wk  . Drug Use: No  . Sexual Activity: Not on file   Other Topics Concern  . Not on file   Social History Narrative   1 son-24 healthy   Lives alone   Family History  Problem Relation Age of Onset  . Diabetes Mother   . Dementia Mother   . Heart disease Mother   . Hypertension Father   . Depression Sister       Review of Systems  All other systems reviewed and are negative.      Objective:   Physical Exam  Vitals reviewed. Constitutional: He appears well-developed and well-nourished. No distress.  Neck: Neck supple. No JVD present. No thyromegaly  present.  Cardiovascular: Normal rate, regular rhythm, normal heart sounds and intact distal pulses.  Exam reveals no gallop and no friction rub.   No murmur heard. Pulmonary/Chest: Effort normal and breath sounds normal. No respiratory distress. He has no wheezes. He has no rales.  Abdominal: Soft. Bowel sounds are normal. He exhibits no distension. There is no tenderness. There is no rebound and no guarding.  Musculoskeletal: He exhibits no edema.  Lymphadenopathy:    He has no cervical adenopathy.  Skin: He is not diaphoretic. There is erythema.          Assessment & Plan:  Elevated LFTs  Hypertriglyceridemia  Hypogonadism in male   We had a long  discussion today regarding his headaches, his hypertriglyceridemia, his mild elevation in his liver function tests, and his hypogonadism. I do believe his alcohol use is likely causing his liver irritation. It may be contributing to his hypogonadism as well as his rosacea. He may also be contributing to his hypertriglyceridemia. I recommended complete abstinence from alcohol for the next 3 months. In 3 months I like to check a fasting lipid panel, a CMP, and a testosterone level. I am not sure as to the cause of the patient's headaches. They could be muscle tension headaches. They could also be due to sinus pressure. Then maybe due to his excessive alcohol use.  Patient is scheduled to followup with ENT later today did review the results of the CT scan.  I like patient back in 3 months. He also reports insomnia. At the present time the right continue Xanax 1 mg by mouth each bedtime when necessary insomnia. He is concerned about possible addiction on his medication. However he is hesitant to change this medication and discontinued alcohol at the same time. Therefore in 3 months if he is able to wean himself away from alcohol, he would be interested in switching from Xanax to possibly trazodone or belsomra.  He has failed Ambien in the past

## 2013-12-29 ENCOUNTER — Telehealth: Payer: Self-pay | Admitting: Family Medicine

## 2013-12-29 NOTE — Telephone Encounter (Signed)
[phone]   Pt has been taking a xanax one a day most of time and he has noticed that if he takes one around 3 or 4 pm and then takes another around bed time that it works a lot better so is he wanting a rx for him to take it 2 times a day  [facility]

## 2014-01-02 ENCOUNTER — Ambulatory Visit
Admission: RE | Admit: 2014-01-02 | Discharge: 2014-01-02 | Disposition: A | Discharge status: Home / Self Care | Payer: 59 | Source: Ambulatory Visit | Attending: Orthopaedic Surgery | Admitting: Orthopaedic Surgery

## 2014-01-02 ENCOUNTER — Other Ambulatory Visit: Payer: Self-pay | Admitting: Orthopaedic Surgery

## 2014-01-02 DIAGNOSIS — M25511 Pain in right shoulder: Secondary | ICD-10-CM

## 2014-01-02 MED ORDER — IOHEXOL 300 MG/ML  SOLN
100.0000 mL | Freq: Once | INTRAMUSCULAR | Status: AC | PRN
  Administered 2014-01-02: 100 mL via INTRAVENOUS

## 2014-01-02 NOTE — Telephone Encounter (Signed)
Per [facility] - I am okay with giving him xanax twice a day for 1 month while he stops alcohol but I would NOT want to continue this long term due to the risk of habituation and dependency.

## 2014-01-03 MED ORDER — ALPRAZOLAM 1 MG PO TABS: 1.0000 mg | ORAL_TABLET | Freq: Two times a day (BID) | ORAL | Status: DC

## 2014-01-03 NOTE — Telephone Encounter (Signed)
Med called to pharm and pt aware 

## 2014-01-05 ENCOUNTER — Ambulatory Visit: Payer: 59 | Admitting: Family Medicine

## 2014-01-08 ENCOUNTER — Ambulatory Visit (INDEPENDENT_AMBULATORY_CARE_PROVIDER_SITE_OTHER): Payer: 59 | Admitting: Family Medicine

## 2014-01-08 ENCOUNTER — Encounter: Payer: Self-pay | Admitting: Family Medicine

## 2014-01-08 VITALS — BP 104/80 | HR 62 | Temp 98.0°F | Resp 14 | Ht 74.0 in | Wt 217.0 lb

## 2014-01-08 DIAGNOSIS — R519 Headache, unspecified: Secondary | ICD-10-CM

## 2014-01-08 DIAGNOSIS — R51 Headache: Secondary | ICD-10-CM

## 2014-01-08 NOTE — Progress Notes (Signed)
Subjective:    Patient ID: Nicholas Sharp, male    DOB: 01/01/1966, 48 y.o.   MRN: [id number]  HPI Patient reports a three-month history of daily headache. It starts in his lower occiput and radiates up the top of his head to his mid scalp in the temporal area.  There are no exacerbating or alleviating factors. The pain is unrelated to exertion. He denies any vision changes. He denies any photophobia or phonophobia. He has no past medical history of migraines. There is no family history of migraines. He denies any oral or nausea associated with it. He denies any neurologic deficits associated with it.  Past Medical History  Diagnosis Date  . GERD (gastroesophageal reflux disease)   . DDD (degenerative disc disease)   . ADHD (attention deficit hyperactivity disorder)   . Hyperlipidemia   . ED (erectile dysfunction)   . Insomnia   . Hypertension   . Reflux   . Anxiety   . Insomnia   . Alcohol consumption heavy    Past Surgical History  Procedure Laterality Date  . Shoulder surgery      x 3  left  . Knee surgery      x 3   right  . Esophagogastroduodenoscopy      Dr Magod-hiatal hernia, normal esophagus & esophageal bx, gastritis (no bx), normal D1/D2  . Esophagogastroduodenoscopy  02/17/2012    Procedure: ESOPHAGOGASTRODUODENOSCOPY (EGD);  Surgeon: Corbin Adeobert M Rourk, MD;  Location: [facility];  Service: Endoscopy;  Laterality: N/A;  3:45   Current Outpatient Prescriptions on File Prior to Visit  Medication Sig Dispense Refill  . ALPRAZolam (XANAX) 1 MG tablet Take 1 tablet (1 mg total) by mouth 2 (two) times daily. TAKE ONE TABLET EVERY NIGHT AS NEEDED FOR ANXIETY  30 tablet  0   No current facility-administered medications on file prior to visit.   No Known Allergies History   Social History  . Marital Status: Single    Spouse Name: N/A    Number of Children: 1  . 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  Smoking status: Never Smoker   . Smokeless tobacco: Never Used  . Alcohol Use: Yes     Comment: several beers/wk  . Drug Use: No  . Sexual Activity: Not on file   Other Topics Concern  . Not on file   Social History Narrative   1 son-24 healthy   Lives alone      Review of Systems  All other systems reviewed and are negative.      Objective:   Physical Exam  Vitals reviewed. Constitutional: He is oriented to person, place, and time.  Eyes: Conjunctivae and EOM are normal. Pupils are equal, round, and reactive to light.  Cardiovascular: Normal rate, regular rhythm and normal heart sounds.   Pulmonary/Chest: Effort normal and breath sounds normal. No respiratory distress. He has no wheezes. He has no rales.  Neurological: He is alert and oriented to person, place, and time. He has normal reflexes. He displays normal reflexes. No cranial nerve deficit. He exhibits normal muscle tone. Coordination normal.          Assessment & Plan:  Headache, chronic daily - Plan: DG Cervical Spine Complete, CT Head Wo Contrast  I suspect these headaches are neurogenic in origin and likely stemming from nerve impingement/arthritic changes in the upper cervical spine. I believe this may be causing impingement  in a superficial nerve. I would like to obtain an x-ray of the cervical spine to eval  for degenerative disease. Patient like to get a CT scan of his head to rule out brain tumor. I explained to the patient that I do not believe his symptoms are due to a brain tumor but he would like to obtain a CT scan for piece of mind. I will schedule that. If the headaches are neurogenic in nature, the patient may benefit from trigger point injections through the headache clinic.

## 2014-01-09 ENCOUNTER — Telehealth: Payer: Self-pay | Admitting: *Deleted

## 2014-01-09 DIAGNOSIS — R51 Headache: Principal | ICD-10-CM

## 2014-01-09 DIAGNOSIS — R519 Headache, unspecified: Secondary | ICD-10-CM

## 2014-01-09 NOTE — Telephone Encounter (Signed)
Called to get precert for CT head wo cm and pt insurance stated that he would benefit for MRI with authorization number [id number], imaging was changed from CT to MRI brain w/wo contrast

## 2014-01-09 NOTE — Telephone Encounter (Signed)
[facility] on Oct 16 at 12:45,

## 2014-01-10 NOTE — Telephone Encounter (Signed)
Pt called back and is aware of appt for MRI to [facility]

## 2014-01-12 ENCOUNTER — Encounter (HOSPITAL_COMMUNITY): Payer: Self-pay

## 2014-01-12 ENCOUNTER — Ambulatory Visit (HOSPITAL_COMMUNITY)
Admission: RE | Admit: 2014-01-12 | Discharge: 2014-01-12 | Disposition: A | Discharge status: Home / Self Care | Payer: 59 | Source: Ambulatory Visit | Attending: Family Medicine | Admitting: Family Medicine

## 2014-01-12 DIAGNOSIS — R51 Headache: Secondary | ICD-10-CM | POA: Diagnosis not present

## 2014-01-12 DIAGNOSIS — J349 Unspecified disorder of nose and nasal sinuses: Secondary | ICD-10-CM | POA: Insufficient documentation

## 2014-01-12 DIAGNOSIS — G8929 Other chronic pain: Secondary | ICD-10-CM

## 2014-01-12 DIAGNOSIS — R519 Headache, unspecified: Secondary | ICD-10-CM

## 2014-01-12 MED ORDER — GADOBENATE DIMEGLUMINE 529 MG/ML IV SOLN
20.0000 mL | Freq: Once | INTRAVENOUS | Status: AC | PRN
  Administered 2014-01-12: 20 mL via INTRAVENOUS

## 2014-02-13 ENCOUNTER — Telehealth: Payer: Self-pay | Admitting: Family Medicine

## 2014-02-13 NOTE — Telephone Encounter (Signed)
Pt is requesting a refill on xanax - per [facility] phone note on 12/29/13 he did not want to continue this long term. His las refill was 01/03/14. ? OK to Refill

## 2014-02-13 NOTE — Telephone Encounter (Signed)
Please let pt know Dr. Tanya NonesPickard does not think this is the best medication for him long term Since he is out of town, I will give 20 tablets only, he needs to make an OV to find a better medication to treat his anxiety, we will not refill again

## 2014-02-14 MED ORDER — ALPRAZOLAM 1 MG PO TABS: 1.0000 mg | ORAL_TABLET | Freq: Two times a day (BID) | ORAL | Status: DC

## 2014-02-14 NOTE — Telephone Encounter (Signed)
Med called to pharm and appt made for pt - 02/27/14

## 2014-02-20 ENCOUNTER — Other Ambulatory Visit: Payer: 59

## 2014-02-20 DIAGNOSIS — E785 Hyperlipidemia, unspecified: Secondary | ICD-10-CM

## 2014-02-20 DIAGNOSIS — I10 Essential (primary) hypertension: Secondary | ICD-10-CM

## 2014-02-20 DIAGNOSIS — E349 Endocrine disorder, unspecified: Secondary | ICD-10-CM

## 2014-02-20 DIAGNOSIS — K219 Gastro-esophageal reflux disease without esophagitis: Secondary | ICD-10-CM

## 2014-02-20 LAB — LIPID PANEL
CHOL/HDL RATIO: 3.7 ratio
Cholesterol: 146 mg/dL (ref 0–200)
HDL: 39 mg/dL — AB (ref >39)
LDL CALC: 94 mg/dL (ref 0–99)
TRIGLYCERIDES: 65 mg/dL (ref <150)
VLDL: 13 mg/dL (ref 0–40)

## 2014-02-20 LAB — CBC WITH DIFFERENTIAL/PLATELET
BASOS PCT: 0 % (ref 0–1)
Basophils Absolute: 0 10*3/uL (ref 0.0–0.1)
Eosinophils Absolute: 0 10*3/uL (ref 0.0–0.7)
Eosinophils Relative: 1 % (ref 0–5)
HCT: 43.7 % (ref 39.0–52.0)
Hemoglobin: 15.1 g/dL (ref 13.0–17.0)
Lymphocytes Relative: 31 % (ref 12–46)
Lymphs Abs: 1.1 10*3/uL (ref 0.7–4.0)
MCH: 28.8 pg (ref 26.0–34.0)
MCHC: 34.6 g/dL (ref 30.0–36.0)
MCV: 83.4 fL (ref 78.0–100.0)
MPV: 10.4 fL (ref 9.4–12.4)
Monocytes Absolute: 0.4 10*3/uL (ref 0.1–1.0)
Monocytes Relative: 11 % (ref 3–12)
NEUTROS PCT: 57 % (ref 43–77)
Neutro Abs: 2 10*3/uL (ref 1.7–7.7)
PLATELETS: 215 10*3/uL (ref 150–400)
RBC: 5.24 MIL/uL (ref 4.22–5.81)
RDW: 13.1 % (ref 11.5–15.5)
WBC: 3.5 10*3/uL — ABNORMAL LOW (ref 4.0–10.5)

## 2014-02-20 LAB — COMPLETE METABOLIC PANEL WITH GFR
ALT: 50 U/L (ref 0–53)
AST: 34 U/L (ref 0–37)
Albumin: 4.4 g/dL (ref 3.5–5.2)
Alkaline Phosphatase: 62 U/L (ref 39–117)
BILIRUBIN TOTAL: 0.7 mg/dL (ref 0.2–1.2)
BUN: 21 mg/dL (ref 6–23)
CO2: 29 meq/L (ref 19–32)
CREATININE: 0.8 mg/dL (ref 0.50–1.35)
Calcium: 9.2 mg/dL (ref 8.4–10.5)
Chloride: 104 mEq/L (ref 96–112)
GFR, Est Non African American: >89 mL/min
Glucose, Bld: 97 mg/dL (ref 70–99)
Potassium: 4.5 mEq/L (ref 3.5–5.3)
Sodium: 140 mEq/L (ref 135–145)
Total Protein: 6.6 g/dL (ref 6.0–8.3)

## 2014-02-21 LAB — TESTOSTERONE: Testosterone: 320 ng/dL (ref 300–890)

## 2014-02-27 ENCOUNTER — Ambulatory Visit: Payer: 59 | Admitting: Family Medicine

## 2014-03-08 ENCOUNTER — Ambulatory Visit (INDEPENDENT_AMBULATORY_CARE_PROVIDER_SITE_OTHER): Payer: 59 | Admitting: Family Medicine

## 2014-03-08 ENCOUNTER — Encounter: Payer: Self-pay | Admitting: Family Medicine

## 2014-03-08 VITALS — BP 122/76 | HR 62 | Temp 98.5°F | Resp 14 | Ht 74.0 in | Wt 213.0 lb

## 2014-03-08 DIAGNOSIS — E781 Pure hyperglyceridemia: Secondary | ICD-10-CM

## 2014-03-08 MED ORDER — HYDROCODONE-HOMATROPINE 5-1.5 MG/5ML PO SYRP: 5.0000 mL | ORAL_SOLUTION | Freq: Three times a day (TID) | ORAL | Status: DC | PRN

## 2014-03-08 NOTE — Progress Notes (Signed)
Subjective:    Patient ID: Nicholas Sharp, male    DOB: 01-08-1966, 48 y.o.   MRN: [id number]  HPI  When last saw this patient, patient has significantly elevated triglycerides, low testosterone, and mild elevation in his liver function test. He was consuming significant alcohol. He was even developing withdrawal headaches on days when he would not drink the patient stated that he would like to refrain from alcohol and then recheck his lab work after 3 months. His most recent lab work is listed below. He has abstained from alcohol completely: Lab on 02/20/2014  Component Date Value Ref Range Status  . Cholesterol 02/20/2014 146  0 - 200 mg/dL Final   Comment: ATP III Classification:       < 200        mg/dL        Desirable      200 - 239     mg/dL        Borderline High      >= 240        mg/dL        High     . Triglycerides 02/20/2014 65  <150 mg/dL Final  . HDL 02/20/2014 39* >39 mg/dL Final  . Total CHOL/HDL Ratio 02/20/2014 3.7   Final  . VLDL 02/20/2014 13  0 - 40 mg/dL Final  . LDL Cholesterol 02/20/2014 94  0 - 99 mg/dL Final   Comment:   Total Cholesterol/HDL Ratio:CHD Risk                        Coronary Heart Disease Risk Table                                        Men       Women          1/2 Average Risk              3.4        3.3              Average Risk              5.0        4.4           2X Average Risk              9.6        7.1           3X Average Risk             23.4       11.0 Use the calculated Patient Ratio above and the CHD Risk table  to determine the patient's CHD Risk. ATP III Classification (LDL):       < 100        mg/dL         Optimal      100 - 129     mg/dL         Near or Above Optimal      130 - 159     mg/dL         Borderline High      160 - 189     mg/dL         High       > 190  mg/dL         Very High     . Testosterone 02/20/2014 320  300 - 890 ng/dL Final   Comment:           [facility] Stage       Male               Male               I              < 30 ng/dL        < 10 ng/dL               II             < 150 ng/dL       < 30 ng/dL               III            100-320 ng/dL     < 35 ng/dL               IV             200-970 ng/dL     15-40 ng/dL               V/Adult        300-890 ng/dL     10-70 ng/dL     . WBC 02/20/2014 3.5* 4.0 - 10.5 K/uL Final  . RBC 02/20/2014 5.24  4.22 - 5.81 MIL/uL Final  . Hemoglobin 02/20/2014 15.1  13.0 - 17.0 g/dL Final  . HCT 02/20/2014 43.7  39.0 - 52.0 % Final  . MCV 02/20/2014 83.4  78.0 - 100.0 fL Final  . MCH 02/20/2014 28.8  26.0 - 34.0 pg Final  . MCHC 02/20/2014 34.6  30.0 - 36.0 g/dL Final  . RDW 02/20/2014 13.1  11.5 - 15.5 % Final  . Platelets 02/20/2014 215  150 - 400 K/uL Final  . Neutrophils Relative % 02/20/2014 57  43 - 77 % Final  . Neutro Abs 02/20/2014 2.0  1.7 - 7.7 K/uL Final  . Lymphocytes Relative 02/20/2014 31  12 - 46 % Final  . Lymphs Abs 02/20/2014 1.1  0.7 - 4.0 K/uL Final  . Monocytes Relative 02/20/2014 11  3 - 12 % Final  . Monocytes Absolute 02/20/2014 0.4  0.1 - 1.0 K/uL Final  . Eosinophils Relative 02/20/2014 1  0 - 5 % Final  . Eosinophils Absolute 02/20/2014 0.0  0.0 - 0.7 K/uL Final  . Basophils Relative 02/20/2014 0  0 - 1 % Final  . Basophils Absolute 02/20/2014 0.0  0.0 - 0.1 K/uL Final  . Smear Review 02/20/2014 Criteria for review not met   Final  . MPV 02/20/2014 10.4  9.4 - 12.4 fL Final  . Sodium 02/20/2014 140  135 - 145 mEq/L Final  . Potassium 02/20/2014 4.5  3.5 - 5.3 mEq/L Final  . Chloride 02/20/2014 104  96 - 112 mEq/L Final  . CO2 02/20/2014 29  19 - 32 mEq/L Final  . Glucose, Bld 02/20/2014 97  70 - 99 mg/dL Final  . BUN 02/20/2014 21  6 - 23 mg/dL Final  . Creat 02/20/2014 0.80  0.50 - 1.35 mg/dL Final  . Total Bilirubin 02/20/2014 0.7  0.2 - 1.2 mg/dL Final  . Alkaline Phosphatase 02/20/2014 62  39 - 117 U/L Final  . AST 02/20/2014 34  0 - 37 U/L  Final  . ALT 02/20/2014 50  0 - 53 U/L Final  .  Total Protein 02/20/2014 6.6  6.0 - 8.3 g/dL Final  . Albumin 02/20/2014 4.4  3.5 - 5.2 g/dL Final  . Calcium 02/20/2014 9.2  8.4 - 10.5 mg/dL Final  . GFR, Est African American 02/20/2014 >89   Final  . GFR, Est Non African American 02/20/2014 >89   Final   Comment:   The estimated GFR is a calculation valid for adults (>=58 years old) that uses the CKD-EPI algorithm to adjust for age and sex. It is   not to be used for children, pregnant women, hospitalized patients,    patients on dialysis, or with rapidly changing kidney function. According to the NKDEP, eGFR >89 is normal, 60-89 shows mild impairment, 30-59 shows moderate impairment, 15-29 shows severe impairment and <15 is ESRD.      Past Medical History  Diagnosis Date  . GERD (gastroesophageal reflux disease)   . DDD (degenerative disc disease)   . ADHD (attention deficit hyperactivity disorder)   . Hyperlipidemia   . ED (erectile dysfunction)   . Insomnia   . Hypertension   . Reflux   . Anxiety   . Insomnia   . Alcohol consumption heavy    Past Surgical History  Procedure Laterality Date  . Shoulder surgery      x 3  left  . Knee surgery      x 3   right  . Esophagogastroduodenoscopy      Dr Magod-hiatal hernia, normal esophagus & esophageal bx, gastritis (no bx), normal D1/D2  . Esophagogastroduodenoscopy  02/17/2012    Procedure: ESOPHAGOGASTRODUODENOSCOPY (EGD);  Surgeon: Daneil Dolin, MD;  Location: [facility];  Service: Endoscopy;  Laterality: N/A;  3:45   Current Outpatient Prescriptions on File Prior to Visit  Medication Sig Dispense Refill  . doxycycline (DORYX) 100 MG DR capsule Take 100 mg by mouth 2 (two) times daily.    Marland Kitchen ALPRAZolam (XANAX) 1 MG tablet Take 1 tablet (1 mg total) by mouth 2 (two) times daily. TAKE ONE TABLET EVERY NIGHT AS NEEDED FOR ANXIETY (Patient not taking: Reported on 03/08/2014) 20 tablet 0   No current facility-administered medications on file prior to visit.   No Known  Allergies History   Social History  . Marital Status: Single    Spouse Name: N/A    Number of Children: 1  . 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 Smoking status: Never Smoker   . Smokeless tobacco: Never Used  . Alcohol Use: Yes     Comment: several beers/wk  . Drug Use: No  . Sexual Activity: Not on file   Other Topics Concern  . Not on file   Social History Narrative   1 son-24 healthy   Lives alone     Review of Systems  All other systems reviewed and are negative.      Objective:   Physical Exam  Cardiovascular: Normal rate, regular rhythm and normal heart sounds.   No murmur heard. Pulmonary/Chest: Effort normal and breath sounds normal. No respiratory distress. He has no wheezes. He has no rales.  Abdominal: Soft. Bowel sounds are normal. He exhibits no distension. There is no tenderness. There is no rebound and no guarding.  Musculoskeletal: He exhibits no edema.  Vitals reviewed.         Assessment & Plan:  Hypertriglyceridemia  Labs  are outstanding. The patient's cholesterol has improved dramatically. His liver function tests have normalized. His headaches have subsided. I encouraged the patient to continue to abstain from alcohol. He does have a mild cough. He would like a cough medication to help him sleep at night to be used temporarily. I gave the patient prescription for Hycodan 1 teaspoon every 8 hours as needed for cough.

## 2014-03-12 ENCOUNTER — Ambulatory Visit: Payer: 59 | Admitting: Family Medicine

## 2014-04-20 ENCOUNTER — Ambulatory Visit: Payer: Self-pay | Admitting: Family Medicine

## 2014-04-26 ENCOUNTER — Ambulatory Visit (INDEPENDENT_AMBULATORY_CARE_PROVIDER_SITE_OTHER): Payer: 59 | Admitting: Family Medicine

## 2014-04-26 ENCOUNTER — Encounter: Payer: Self-pay | Admitting: Family Medicine

## 2014-04-26 VITALS — BP 136/80 | HR 72 | Temp 98.0°F | Resp 16 | Ht 74.0 in | Wt 224.0 lb

## 2014-04-26 DIAGNOSIS — J029 Acute pharyngitis, unspecified: Secondary | ICD-10-CM

## 2014-04-26 DIAGNOSIS — L719 Rosacea, unspecified: Secondary | ICD-10-CM

## 2014-04-26 LAB — RAPID STREP SCREEN (MED CTR MEBANE ONLY): STREPTOCOCCUS, GROUP A SCREEN (DIRECT): NEGATIVE

## 2014-04-26 MED ORDER — METRONIDAZOLE 1 % EX GEL: Freq: Every day | CUTANEOUS | Status: DC

## 2014-04-26 MED ORDER — HYDROCODONE-HOMATROPINE 5-1.5 MG/5ML PO SYRP: 5.0000 mL | ORAL_SOLUTION | Freq: Three times a day (TID) | ORAL | Status: DC | PRN

## 2014-04-26 MED ORDER — DOXYCYCLINE HYCLATE 100 MG PO TABS: 100.0000 mg | ORAL_TABLET | Freq: Two times a day (BID) | ORAL | Status: DC

## 2014-04-26 NOTE — Progress Notes (Signed)
Subjective:    Patient ID: Nicholas Sharp, male    DOB: 01/31/1966, 49 y.o.   MRN: [id number]  HPI  Patient has had a sore throat for 4 days. He also reports rhinorrhea and nasal congestion. He reports a nonproductive cough and a dull headache. Strep test today is negative. He is also complaining of rosacea. He has an erythematous rash on both cheeks and on his forehead. There are no papules or bumps. The rash extends over the nasal bridge and onto his chest. He is also complaining of dry itchy eyes associated with the rash. Past Medical History  Diagnosis Date  . GERD (gastroesophageal reflux disease)   . DDD (degenerative disc disease)   . ADHD (attention deficit hyperactivity disorder)   . Hyperlipidemia   . ED (erectile dysfunction)   . Insomnia   . Hypertension   . Reflux   . Anxiety   . Insomnia   . Alcohol consumption heavy    Past Surgical History  Procedure Laterality Date  . Shoulder surgery      x 3  left  . Knee surgery      x 3   right  . Esophagogastroduodenoscopy      Dr Magod-hiatal hernia, normal esophagus & esophageal bx, gastritis (no bx), normal D1/D2  . Esophagogastroduodenoscopy  02/17/2012    Procedure: ESOPHAGOGASTRODUODENOSCOPY (EGD);  Surgeon: Corbin Adeobert M Rourk, MD;  Location: [facility];  Service: Endoscopy;  Laterality: N/A;  3:45   Current Outpatient Prescriptions on File Prior to Visit  Medication Sig Dispense Refill  . ALPRAZolam (XANAX) 1 MG tablet Take 1 tablet (1 mg total) by mouth 2 (two) times daily. TAKE ONE TABLET EVERY NIGHT AS NEEDED FOR ANXIETY 20 tablet 0  . doxycycline (DORYX) 100 MG DR capsule Take 100 mg by mouth 2 (two) times daily.     No current facility-administered medications on file prior to visit.   No Known Allergies History   Social History  . Marital Status: Single    Spouse Name: N/A    Number of Children: 1  . 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 Smoking status: Never Smoker   . Smokeless tobacco: Never Used  . Alcohol Use: Yes     Comment: several beers/wk  . Drug Use: No  . Sexual Activity: Not on file   Other Topics Concern  . Not on file   Social History Narrative   1 son-24 healthy   Lives alone     Review of Systems  All other systems reviewed and are negative.      Objective:   Physical Exam  Constitutional: He appears well-developed and well-nourished.  HENT:  Right Ear: External ear normal.  Left Ear: External ear normal.  Nose: Nose normal.  Mouth/Throat: Oropharynx is clear and moist. No oropharyngeal exudate.  Eyes: Conjunctivae and EOM are normal. Pupils are equal, round, and reactive to light.  Neck: Neck supple.  Cardiovascular: Normal rate, regular rhythm and normal heart sounds.   Pulmonary/Chest: Effort normal and breath sounds normal. No respiratory distress. He has no wheezes. He has no rales. He exhibits no tenderness.  Lymphadenopathy:    He has no cervical adenopathy.  Skin: Rash noted. There is erythema.  Vitals reviewed.         Assessment & Plan:  Acute pharyngitis, unspecified pharyngitis type - Plan: Rapid Strep Screen, HYDROcodone-homatropine (HYCODAN) 5-1.5 MG/5ML syrup  Rosacea - Plan: doxycycline (VIBRA-TABS) 100 MG tablet, metroNIDAZOLE (METROGEL) 1 % gel  Patient has a viral upper respiratory infection. I will give him Hycodan as needed for cough. I recommended tincture of time with supportive care including Chloraseptic for sore throat. He also has rosacea and I recommended doxycycline 100 mg by mouth twice a day for 10 days. Once the rash is better controlled I would switch the patient MetroGel applied daily to the rash.

## 2014-05-08 ENCOUNTER — Other Ambulatory Visit: Payer: Self-pay | Admitting: Family Medicine

## 2014-05-08 MED ORDER — DOXYCYCLINE HYCLATE 50 MG PO CAPS: 50.0000 mg | ORAL_CAPSULE | Freq: Every day | ORAL | Status: DC

## 2014-05-08 NOTE — Telephone Encounter (Signed)
Med sent to pharm 

## 2014-05-11 ENCOUNTER — Other Ambulatory Visit: Payer: Self-pay | Admitting: Family Medicine

## 2014-05-11 ENCOUNTER — Ambulatory Visit (HOSPITAL_COMMUNITY)
Admission: RE | Admit: 2014-05-11 | Discharge: 2014-05-11 | Disposition: A | Discharge status: Home / Self Care | Payer: 59 | Source: Ambulatory Visit | Attending: Family Medicine | Admitting: Family Medicine

## 2014-05-11 ENCOUNTER — Encounter: Payer: Self-pay | Admitting: Family Medicine

## 2014-05-11 ENCOUNTER — Telehealth: Payer: Self-pay | Admitting: *Deleted

## 2014-05-11 ENCOUNTER — Ambulatory Visit (INDEPENDENT_AMBULATORY_CARE_PROVIDER_SITE_OTHER): Payer: 59 | Admitting: Family Medicine

## 2014-05-11 VITALS — BP 122/74 | HR 76 | Temp 98.0°F | Resp 18 | Wt 231.0 lb

## 2014-05-11 DIAGNOSIS — M79675 Pain in left toe(s): Secondary | ICD-10-CM

## 2014-05-11 DIAGNOSIS — M79672 Pain in left foot: Secondary | ICD-10-CM

## 2014-05-11 DIAGNOSIS — W208XXA Other cause of strike by thrown, projected or falling object, initial encounter: Secondary | ICD-10-CM | POA: Diagnosis not present

## 2014-05-11 DIAGNOSIS — S92512A Displaced fracture of proximal phalanx of left lesser toe(s), initial encounter for closed fracture: Secondary | ICD-10-CM | POA: Insufficient documentation

## 2014-05-11 DIAGNOSIS — S92912A Unspecified fracture of left toe(s), initial encounter for closed fracture: Secondary | ICD-10-CM

## 2014-05-11 MED ORDER — MELOXICAM 7.5 MG PO TABS: 7.5000 mg | ORAL_TABLET | Freq: Every day | ORAL | Status: DC

## 2014-05-11 MED ORDER — OXYCODONE-ACETAMINOPHEN 5-325 MG PO TABS: 1.0000 | ORAL_TABLET | Freq: Three times a day (TID) | ORAL | Status: DC | PRN

## 2014-05-11 NOTE — Telephone Encounter (Signed)
Yes ,please get xray - he needs before appt

## 2014-05-11 NOTE — Patient Instructions (Addendum)
Take pain medicine as prescribed ICE, and elevate Meloxicam during day for inflammation Buddy Tape to middle toe for next week Referral to podiatry if not improved F/U as needed

## 2014-05-11 NOTE — Progress Notes (Signed)
Patient ID: Nicholas Sharp, male   DOB: 08/20/1965, 49 y.o.   MRN: [id number]   Subjective:    Patient ID: Nicholas Sharp, male    DOB: 04/25/1965, 49 y.o.   MRN: [id number]  Patient presents for left foot injury  patient here with left toe injury. 8 days ago he dropped a glass bottle of beer on his foot while trying to get it out of the freezer. He subsequently had some swelling and bruising. He still has pain when he is walking around. X-ray was obtained this morning which shows:  Minimally displaced obliquely oriented fracture involving the distal metadiaphysis of the proximal phalanx of the fourth digit without definite intra-articular extension. 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 No pain meds have been taken   Review Of Systems:  GEN- denies fatigue, fever, weight loss,weakness, recent illness HEENT- denies eye drainage, change in vision, nasal discharge, CVS- denies chest pain, palpitations RESP- denies SOB, cough, wheeze ABD- denies N/V, change in stools, abd pain GU- denies dysuria, hematuria, dribbling, incontinence MSK- +oint pain, muscle aches, injury Neuro- denies headache, dizziness, syncope, seizure activity       Objective:    BP 122/74 mmHg  Pulse 76  Temp(Src) 98 F (36.7 C) (Oral)  Resp 18  Wt 231 lb (104.781 kg) GEN- NAD, alert and oriented x3 MSK - Left foot- 4th toe, swelling over entire toe, TTP near center of toe, mild TTP over 3rd digit, no bruising or ecchymosis seen, weight bearing         Assessment & Plan:      Problem List Items Addressed This Visit    None    Visit Diagnoses    Toe fracture, left, closed, initial encounter    -  Primary    Very minimally displaced discussed options, walking boot, podiatry, prefers to hold off, taught to buddy tape in office, given NSAIDS for day, percocet, ICE,  Will give him another week, if making no prognosis or  worsening he agrees to see podiatry       Note: This dictation was prepared with Dragon dictation along with smaller phrase technology. Any transcriptional errors that result from this process are unintentional.

## 2014-05-11 NOTE — Telephone Encounter (Signed)
Pt called stating he had dropped something on his left foot and 3rd toe week ago and it is still swollen and red, pt has appt with Dr.[place] today at 2pm, wants to know if he should get xray before his appointment to make sure it isnt broken? Please advise!

## 2014-05-11 NOTE — Telephone Encounter (Signed)
Pt aware to go ahead and get xrays done of left foot and toe pt is going to [facility] now to have done

## 2014-06-19 ENCOUNTER — Telehealth: Payer: Self-pay | Admitting: Family Medicine

## 2014-06-19 NOTE — Telephone Encounter (Signed)
[phone]  Pt has questions about getting a Vitamin D test done. His dad had recently been tested and his level was low and the pt is wanting to know if he can get that done or does he need to have an apt with provider first?

## 2014-06-19 NOTE — Telephone Encounter (Signed)
lmtrc

## 2014-06-25 NOTE — Telephone Encounter (Signed)
lmtrc

## 2014-06-27 ENCOUNTER — Other Ambulatory Visit: Payer: Self-pay | Admitting: Family Medicine

## 2014-06-27 MED ORDER — DOXYCYCLINE HYCLATE 50 MG PO CAPS: 50.0000 mg | ORAL_CAPSULE | Freq: Every day | ORAL | Status: DC

## 2014-06-27 NOTE — Telephone Encounter (Signed)
Pt called back and wants to know if he can come in to just get blood work done for Vit D, states his dad (pt here) is severely Vit D defiencey and is concerned that he could be also and would like to have it checked. Also, pt states that he has found on tick in his "private" 2 weeks ago and is raised and red,has had some fatigue wants to know if he can use his doxycline that he has left over or can you prescribe him some tetracycline. I advised pt he should monitor the bite and if gets worse may need to come in.I also advised pt to go ahead and use the doxycyline and will call him if there is a different prescription called in. Please advise!

## 2014-06-27 NOTE — Telephone Encounter (Signed)
Med sent to pharm 

## 2014-06-28 NOTE — Telephone Encounter (Signed)
I would only use doxycycline for an infection (spreadin red ring or fever and flu like symptoms).

## 2014-06-28 NOTE — Telephone Encounter (Signed)
Pt aware of recommendations and informed pt to contact insurance to see if they will cover a screening vit d and he will and has a f/u appt next week.

## 2014-07-03 ENCOUNTER — Ambulatory Visit (INDEPENDENT_AMBULATORY_CARE_PROVIDER_SITE_OTHER): Payer: 59 | Admitting: Family Medicine

## 2014-07-03 ENCOUNTER — Encounter: Payer: Self-pay | Admitting: Family Medicine

## 2014-07-03 VITALS — BP 136/94 | HR 80 | Temp 98.0°F | Resp 14 | Ht 74.0 in | Wt 229.0 lb

## 2014-07-03 DIAGNOSIS — R5382 Chronic fatigue, unspecified: Secondary | ICD-10-CM

## 2014-07-03 DIAGNOSIS — H5713 Ocular pain, bilateral: Secondary | ICD-10-CM

## 2014-07-03 LAB — CBC WITH DIFFERENTIAL/PLATELET
BASOS ABS: 0.1 10*3/uL (ref 0.0–0.1)
Basophils Relative: 1 % (ref 0–1)
Eosinophils Absolute: 0.2 10*3/uL (ref 0.0–0.7)
Eosinophils Relative: 4 % (ref 0–5)
HEMATOCRIT: 45.7 % (ref 39.0–52.0)
Hemoglobin: 15.8 g/dL (ref 13.0–17.0)
LYMPHS PCT: 27 % (ref 12–46)
Lymphs Abs: 1.4 10*3/uL (ref 0.7–4.0)
MCH: 29.6 pg (ref 26.0–34.0)
MCHC: 34.6 g/dL (ref 30.0–36.0)
MCV: 85.6 fL (ref 78.0–100.0)
MONOS PCT: 11 % (ref 3–12)
MPV: 10.1 fL (ref 8.6–12.4)
Monocytes Absolute: 0.6 10*3/uL (ref 0.1–1.0)
NEUTROS ABS: 3 10*3/uL (ref 1.7–7.7)
Neutrophils Relative %: 57 % (ref 43–77)
PLATELETS: 222 10*3/uL (ref 150–400)
RBC: 5.34 MIL/uL (ref 4.22–5.81)
RDW: 13.4 % (ref 11.5–15.5)
WBC: 5.2 10*3/uL (ref 4.0–10.5)

## 2014-07-03 LAB — COMPLETE METABOLIC PANEL WITH GFR
ALT: 47 U/L (ref 0–53)
AST: 32 U/L (ref 0–37)
Albumin: 4.3 g/dL (ref 3.5–5.2)
Alkaline Phosphatase: 72 U/L (ref 39–117)
BUN: 30 mg/dL — AB (ref 6–23)
CALCIUM: 9.3 mg/dL (ref 8.4–10.5)
CHLORIDE: 102 meq/L (ref 96–112)
CO2: 25 mEq/L (ref 19–32)
Creat: 0.89 mg/dL (ref 0.50–1.35)
GFR, Est Non African American: >89 mL/min
Glucose, Bld: 99 mg/dL (ref 70–99)
Potassium: 5 mEq/L (ref 3.5–5.3)
Sodium: 136 mEq/L (ref 135–145)
Total Bilirubin: 0.5 mg/dL (ref 0.2–1.2)
Total Protein: 6.8 g/dL (ref 6.0–8.3)

## 2014-07-03 LAB — TSH: TSH: 1.426 u[IU]/mL (ref 0.350–4.500)

## 2014-07-03 NOTE — Progress Notes (Signed)
Subjective:    Patient ID: Nicholas Sharp, male    DOB: 02/06/1966, 49 y.o.   MRN: [id number]  HPI Patient presents today complaining of feeling lousy. His symptoms are very vague. He seems frustrated and upset patient complains mainly of severe fatigue. He is also having almost daily pain located around both eyes. He does have a history of rosacea. He complains of burning pain in the eye and pain and pressure around the eyes. I have tried the patient on oral doxycycline without benefit for this. He is seen a near nose and throat physician in the past and had CT scans of his sinuses in the past all of which were normal. He is very frustrated by the persistent pain and burning that occurs on a daily basis around his eyes. On examination today however there is no visible abnormality in the eye. His vision is normal. His rosacea today is well controlled. He also complains of severe fatigue. He has no desire to do anything. He has to force himself to exercise. He is not getting pleasure out of his daily activities such as hunting and fishing. He reports apathy. He just sits at home and watches TV with no desire to do anything. However he bristles at the idea of depression. He denies any suicidal intent. He does agree that he has anhedonia. Past Medical History  Diagnosis Date  . GERD (gastroesophageal reflux disease)   . DDD (degenerative disc disease)   . ADHD (attention deficit hyperactivity disorder)   . Hyperlipidemia   . ED (erectile dysfunction)   . Insomnia   . Hypertension   . Reflux   . Anxiety   . Insomnia   . Alcohol consumption heavy    Past Surgical History  Procedure Laterality Date  . Shoulder surgery      x 3  left  . Knee surgery      x 3   right  . Esophagogastroduodenoscopy      Dr Magod-hiatal hernia, normal esophagus & esophageal bx, gastritis (no bx), normal D1/D2  . Esophagogastroduodenoscopy  02/17/2012    Procedure: ESOPHAGOGASTRODUODENOSCOPY (EGD);  Surgeon:  Corbin Adeobert M Rourk, MD;  [facility];  Service: Endoscopy;  Laterality: N/A;  3:45   Current Outpatient Prescriptions on File Prior to Visit  Medication Sig Dispense Refill  . ALPRAZolam (XANAX) 1 MG tablet Take 1 tablet (1 mg total) by mouth 2 (two) times daily. TAKE ONE TABLET EVERY NIGHT AS NEEDED FOR ANXIETY (Patient not taking: Reported on 07/03/2014) 20 tablet 0  . doxycycline (VIBRAMYCIN) 50 MG capsule Take 1 capsule (50 mg total) by mouth daily. (Patient not taking: Reported on 07/03/2014) 30 capsule 0  . metroNIDAZOLE (METROGEL) 1 % gel Apply topically daily. (Patient not taking: Reported on 07/03/2014) 45 g 5  . oxyCODONE-acetaminophen (ROXICET) 5-325 MG per tablet Take 1 tablet by mouth every 8 (eight) hours as needed for severe pain. (Patient not taking: Reported on 07/03/2014) 30 tablet 0   No current facility-administered medications on file prior to visit.   No Known Allergies History   Social History  . Marital Status: Single    Spouse Name: N/A  . Number of Children: 1  . 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 Smoking status: Never Smoker   . Smokeless tobacco: Never Used  . Alcohol Use: Yes     Comment: several beers/wk  . Drug Use: No  .  Sexual Activity: Not on file   Other Topics Concern  . Not on file   Social History Narrative   1 son-24 healthy   Lives alone      Review of Systems  All other systems reviewed and are negative.      Objective:   Physical Exam  Eyes: Conjunctivae and EOM are normal. Pupils are equal, round, and reactive to light. No scleral icterus.  Cardiovascular: Normal rate, regular rhythm and normal heart sounds.   Pulmonary/Chest: Effort normal and breath sounds normal.  Vitals reviewed.         Assessment & Plan:  Chronic fatigue - Plan: Vit D  25 hydroxy (rtn osteoporosis monitoring), TSH, CBC with Differential/Platelet, COMPLETE METABOLIC PANEL WITH  GFR, Testosterone, Free, Total, SHBG, CANCELED: Testosterone,Free and Total  Eye pain, bilateral  I believe the patient's burning sensation in his eyes is likely due to rosacea. I have recommended he see an ophthalmologist. The patient would like to schedule this on his iron with an eye doctor he is seen near his home in ED. I believe the most likely destination for his chronic fatigue is a combination of hypogonadism which he has a known history of in the past couple with most likely depression. Also based on his behavior today, his appearance, his affect, and his symptoms, the majority of his symptoms sound like depression. The patient seems dubious of this diagnosis. Therefore I will check lab work to monitor for other causes of fatigue including a free and total testosterone level, CMP, CBC, TSH, and vitamin D. I will treat any obvious lab abnormalities. I will keep depression on the differential diagnosis. I truly believe this is most likely the patient's diagnosis.

## 2014-07-04 LAB — VITAMIN D 25 HYDROXY (VIT D DEFICIENCY, FRACTURES): VIT D 25 HYDROXY: 34 ng/mL (ref 30–100)

## 2014-07-04 LAB — TESTOSTERONE, FREE, TOTAL, SHBG
SEX HORMONE BINDING: 18 nmol/L (ref 10–50)
TESTOSTERONE-% FREE: 2.6 % (ref 1.6–2.9)
TESTOSTERONE: 191 ng/dL — AB (ref 300–890)
Testosterone, Free: 49.2 pg/mL (ref 47.0–244.0)

## 2014-07-10 ENCOUNTER — Other Ambulatory Visit: Payer: Self-pay | Admitting: Family Medicine

## 2014-07-10 MED ORDER — TESTOSTERONE CYPIONATE 200 MG/ML IM SOLN: 200.0000 mg | INTRAMUSCULAR | Status: DC

## 2014-07-17 ENCOUNTER — Telehealth: Payer: Self-pay | Admitting: Cardiology

## 2014-07-17 ENCOUNTER — Encounter: Payer: Self-pay | Admitting: Cardiology

## 2014-07-17 ENCOUNTER — Ambulatory Visit (INDEPENDENT_AMBULATORY_CARE_PROVIDER_SITE_OTHER): Payer: 59 | Admitting: Cardiology

## 2014-07-17 VITALS — BP 122/80 | HR 93 | Ht 74.0 in | Wt 222.9 lb

## 2014-07-17 DIAGNOSIS — R072 Precordial pain: Secondary | ICD-10-CM | POA: Diagnosis not present

## 2014-07-17 DIAGNOSIS — R079 Chest pain, unspecified: Secondary | ICD-10-CM

## 2014-07-17 NOTE — Patient Instructions (Addendum)
Your physician has requested that you have an exercise tolerance test. For further information please visit [email or web address] Please also follow instruction sheet, as given.  Your physician has requested that you have CT Cardiac Score, this will be done at the hospital.  Your physician recommends that you schedule a follow-up appointment As Needed.    Exercise Stress Electrocardiogram An exercise stress electrocardiogram is a test that is done to evaluate the blood supply to your heart. This test may also be called exercise stress electrocardiography. The test is done while you are walking on a treadmill. The goal of this test is to raise your heart rate. This test is done to find areas of poor blood flow to the heart by determining the extent of coronary artery disease (CAD).   CAD is defined as narrowing in one or more heart (coronary) arteries of more than 70%. If you have an abnormal test result, this may mean that you are not getting adequate blood flow to your heart during exercise. Additional testing may be needed to understand why your test was abnormal. LET [facility] CARE PROVIDER KNOW ABOUT:   Any allergies you have.  All medicines you are taking, including vitamins, herbs, eye drops, creams, and over-the-counter medicines.  Previous problems you or members of your family have had with the use of anesthetics.  Any blood disorders you have.  Previous surgeries you have had.  Medical conditions you have.  Possibility of pregnancy, if this applies. RISKS AND COMPLICATIONS Generally, this is a safe procedure. However, as with any procedure, complications can occur. Possible complications can include:  Pain or pressure in the following areas:  Chest.  Jaw or neck.  Between your shoulder blades.  Radiating down your left arm.  Dizziness or light-headedness.  Shortness of breath.  Increased or irregular heartbeats.  Nausea or vomiting.  Heart attack  (rare). BEFORE THE PROCEDURE  Avoid all forms of caffeine 24 hours before your test or as directed by your health care provider. This includes coffee, tea (even decaffeinated tea), caffeinated sodas, chocolate, cocoa, and certain pain medicines.  Follow your health care provider's instructions regarding eating and drinking before the test.  Take your medicines as directed at regular times with water unless instructed otherwise. Exceptions may include:  If you have diabetes, ask how you are to take your insulin or pills. It is common to adjust insulin dosing the morning of the test.  If you are taking beta-blocker medicines, it is important to talk to your health care provider about these medicines well before the date of your test. Taking beta-blocker medicines may interfere with the test. In some cases, these medicines need to be changed or stopped 24 hours or more before the test.  If you wear a nitroglycerin patch, it may need to be removed prior to the test. Ask your health care provider if the patch should be removed before the test.  If you use an inhaler for any breathing condition, bring it with you to the test.  If you are an outpatient, bring a snack so you can eat right after the stress phase of the test.  Do not smoke for 4 hours prior to the test or as directed by your health care provider.  Do not apply lotions, powders, creams, or oils on your chest prior to the test.  Wear loose-fitting clothes and comfortable shoes for the test. This test involves walking on a treadmill. PROCEDURE  Multiple patches (electrodes) will be put on your  chest. If needed, small areas of your chest may have to be shaved to get better contact with the electrodes. Once the electrodes are attached to your body, multiple wires will be attached to the electrodes and your heart rate will be monitored.  Your heart will be monitored both at rest and while exercising.  You will walk on a treadmill. The  treadmill will be started at a slow pace. The treadmill speed and incline will gradually be increased to raise your heart rate. AFTER THE PROCEDURE  Your heart rate and blood pressure will be monitored after the test.  You may return to your normal schedule including diet, activities, and medicines, unless your health care provider tells you otherwise. Document Released: 03/13/2000 Document Revised: 03/21/2013 Document Reviewed: 11/21/2012 [facility] Patient Information 2015 [place], [place]. This information is not intended to replace advice given to you by your health care provider. Make sure you discuss any questions you have with your health care provider.

## 2014-07-17 NOTE — Telephone Encounter (Signed)
Pt. Was encouraged to go to the Er if he had anymore chest pain or any symptoms that are keeping him from being able to do his job, pt. Stated understanding of instructions

## 2014-07-17 NOTE — Telephone Encounter (Signed)
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 He did not go to the ER to be evaluated. He prefers to see Dr. Antoine PocheHochrein because his father Hal MoralesBob H. Thomasena EdisCollins is a pt of his. Please f/u with him  Thanks

## 2014-07-17 NOTE — Telephone Encounter (Signed)
Scheduled pt to come in to see Dr. Antoine PocheHochrein today at 3:00pm

## 2014-07-17 NOTE — Telephone Encounter (Signed)
Regina to talk with Dr. Antoine PocheHochrein when he comes in today about this pt.

## 2014-07-17 NOTE — Progress Notes (Signed)
Cardiology Office Note   Date:  07/17/2014   ID:  Nicholas Birchwoodandall W Sharp, DOB 10/16/1965, MRN [id number]  PCP:  Leo GrosserPICKARD,WARREN TOM, MD  Cardiologist:   Rollene RotundaJames Yer Olivencia, MD   Chief Complaint  Patient presents with  . Chest Pain      History of Present Illness: Nicholas Sharp is a 49 y.o. male who presents for the patient has no prior cardiac history. He did have a stress test a few years ago to evaluate some arm discomfort. He is a Company secretaryfireman. He said he walked 8 flights of stairs carrying full pack at an alarm recently. At that time he developed midsternal chest discomfort that he said was 9 out of 10 in intensity. He never had this kind of discomfort before. He was somewhat short of breath. He had to stop. It went away and he was able to continue. He since has not had the symptoms. He said that prior to this it not been getting this discomfort. He has been more fatigued over several months. He has less energy to do the things he would do typically. He has discussed this with his primary provider and is being treated right now with testosterone. He otherwise does not get chest pressure, neck or arm discomfort. He does not have palpitations, presyncope or syncope. He does not have PND or orthopnea. She's not had weight gain or edema.   Past Medical History  Diagnosis Date  . GERD (gastroesophageal reflux disease)   . DDD (degenerative disc disease)   . ADHD (attention deficit hyperactivity disorder)   . Hyperlipidemia     Diet controlled  . ED (erectile dysfunction)   . Insomnia     Previously  . Alcohol consumption heavy     Past Surgical History  Procedure Laterality Date  . Shoulder surgery      x 2  left  . Knee surgery      x 2   right  . Esophagogastroduodenoscopy      Dr Magod-hiatal hernia, normal esophagus & esophageal bx, gastritis (no bx), normal D1/D2  . Esophagogastroduodenoscopy  02/17/2012    Procedure: ESOPHAGOGASTRODUODENOSCOPY (EGD);  Surgeon: Corbin Adeobert M Rourk,  MD;  [facility];  Service: Endoscopy;  Laterality: N/A;  3:45     Current Outpatient Prescriptions  Medication Sig Dispense Refill  . testosterone cypionate (DEPOTESTOTERONE CYPIONATE) 200 MG/ML injection Inject 1 mL (200 mg total) into the muscle every 14 (fourteen) days. 2 mL 5   No current facility-administered medications for this visit.    Allergies:   Review of patient's allergies indicates no known allergies.    Social History:  The patient  reports that he has never smoked. He has never used smokeless tobacco. He reports that he drinks alcohol. He reports that he does not use illicit drugs.   Family History:  The patient's family history includes Congestive Heart Failure in his mother; Dementia in his mother; Depression in his sister; Diabetes in his mother; Hypertension in his father.    ROS:  Please see the history of present illness.   Otherwise, review of systems are positive for none.   All other systems are reviewed and negative.    PHYSICAL EXAM: VS:  BP 122/80 mmHg  Pulse 93  Ht 6\' 2"  (1.88 m)  Wt 222 lb 14.4 oz (101.107 kg)  BMI 28.61 kg/m2 , BMI Body mass index is 28.61 kg/(m^2). GENERAL:  Well appearing HEENT:  Pupils equal round and reactive, fundi not visualized,  oral mucosa unremarkable NECK:  No jugular venous distention, waveform within normal limits, carotid upstroke brisk and symmetric, no bruits, no thyromegaly LYMPHATICS:  No cervical, inguinal adenopathy LUNGS:  Clear to auscultation bilaterally BACK:  No CVA tenderness CHEST:  Unremarkable HEART:  PMI not displaced or sustained,S1 and S2 within normal limits, no S3, no S4, no clicks, no rubs, no murmurs ABD:  Flat, positive bowel sounds normal in frequency in pitch, no bruits, no rebound, no guarding, no midline pulsatile mass, no hepatomegaly, no splenomegaly EXT:  2 plus pulses throughout, no edema, no cyanosis no clubbing SKIN:  No rashes no nodules NEURO:  Cranial nerves II through  XII grossly intact, motor grossly intact throughout PSYCH:  Cognitively intact, oriented to person place and time    EKG:  EKG is ordered today. The ekg ordered today demonstrates sinus rhythm, rate 93, axis within normal limits, intervals within normal limits, no acute ST-T wave changes.   Recent Labs: 07/03/2014: ALT 47; BUN 30*; Creatinine 0.89; Hemoglobin 15.8; Platelets 222; Potassium 5.0; Sodium 136; TSH 1.426    Lipid Panel    Component Value Date/Time   CHOL 146 02/20/2014 1112   TRIG 65 02/20/2014 1112   HDL 39* 02/20/2014 1112   CHOLHDL 3.7 02/20/2014 1112   VLDL 13 02/20/2014 1112   LDLCALC 94 02/20/2014 1112      Wt Readings from Last 3 Encounters:  07/17/14 222 lb 14.4 oz (101.107 kg)  07/03/14 229 lb (103.874 kg)  05/11/14 231 lb (104.781 kg)      Other studies Reviewed: Additional studies/ records that were reviewed today include: None.   ASSESSMENT AND PLAN:  CHEST PAIN:  I think the pretest probability of obstructive coronary disease is low given the fact that his was the first episode and he has no risk factors and an otherwise normal exam and EKG. However, given the severity of the symptoms he needs to be screened with a stress test. I will bring the patient back for a POET (Plain Old Exercise Test). This will allow me to screen for obstructive coronary disease, risk stratify and very importantly provide a prescription for exercise.  I have also suggested a coronary calcium score for further risk stratification. He understands that should he have any increasing symptoms between now and then he would need to see me sooner or present to the emergency room.  ALCOHOL USE:  We discussed limiting his beer intake.  FATIGUE:  He will continue his therapy through [facility] TOM, MD.  Apparently they have discussed the possibility of depression which could be considered testosterone therapy doesn't help.   Current medicines are reviewed at length with the  patient today.  The patient does not have concerns regarding medicines.  The following changes have been made:  no change  Labs/ tests ordered today include:   Orders Placed This Encounter  Procedures  . CT Cardiac Scoring  . EKG 12-Lead  . Exercise tolerance test     Disposition:   FU with me as needed after the tests.    Signed, Rollene Rotunda, MD  07/17/2014 5:52 PM    [facility]

## 2014-07-18 DIAGNOSIS — R072 Precordial pain: Secondary | ICD-10-CM | POA: Insufficient documentation

## 2014-07-20 ENCOUNTER — Ambulatory Visit (INDEPENDENT_AMBULATORY_CARE_PROVIDER_SITE_OTHER)
Admission: RE | Admit: 2014-07-20 | Discharge: 2014-07-20 | Disposition: A | Discharge status: Home / Self Care | Payer: 59 | Source: Ambulatory Visit | Attending: Cardiology | Admitting: Cardiology

## 2014-07-20 DIAGNOSIS — R079 Chest pain, unspecified: Secondary | ICD-10-CM

## 2014-07-23 ENCOUNTER — Other Ambulatory Visit: Payer: Self-pay

## 2014-07-23 ENCOUNTER — Ambulatory Visit (HOSPITAL_COMMUNITY)
Admission: RE | Admit: 2014-07-23 | Discharge: 2014-07-23 | Disposition: A | Discharge status: Home / Self Care | Payer: 59 | Source: Ambulatory Visit | Attending: Cardiology | Admitting: Cardiology

## 2014-07-23 DIAGNOSIS — R079 Chest pain, unspecified: Secondary | ICD-10-CM | POA: Insufficient documentation

## 2014-07-23 NOTE — Progress Notes (Cosign Needed)
ETT completed without complications, no chest pain no SOB.  See EKG portion for results.  BP mildly elevated with exercise.

## 2014-07-25 ENCOUNTER — Other Ambulatory Visit: Payer: Self-pay | Admitting: *Deleted

## 2014-07-25 DIAGNOSIS — R079 Chest pain, unspecified: Secondary | ICD-10-CM

## 2014-08-15 ENCOUNTER — Telehealth: Payer: Self-pay | Admitting: Cardiology

## 2014-08-15 ENCOUNTER — Encounter (HOSPITAL_COMMUNITY): Payer: 59

## 2014-08-15 NOTE — Telephone Encounter (Signed)
Message routed to Dr. Antoine PocheHochrein and JC to advise on test results. Per note, appears Stanton Kidneyebra, RN notified patient of CT calcium score

## 2014-08-15 NOTE — Telephone Encounter (Signed)
Pt called in stating that he had a stress test and a Calcium test done around 4/22 and he has not received the results to those test yet. Please f/u with the pt  Thanks

## 2014-08-16 ENCOUNTER — Ambulatory Visit: Payer: 59 | Admitting: Cardiology

## 2014-08-22 ENCOUNTER — Encounter: Payer: Self-pay | Admitting: Physician Assistant

## 2014-08-22 ENCOUNTER — Ambulatory Visit (INDEPENDENT_AMBULATORY_CARE_PROVIDER_SITE_OTHER): Payer: 59 | Admitting: Physician Assistant

## 2014-08-22 VITALS — BP 118/82 | HR 72 | Temp 98.1°F | Resp 18 | Wt 225.0 lb

## 2014-08-22 DIAGNOSIS — R300 Dysuria: Secondary | ICD-10-CM

## 2014-08-22 LAB — URINALYSIS, ROUTINE W REFLEX MICROSCOPIC
Bilirubin Urine: NEGATIVE
GLUCOSE, UA: NEGATIVE mg/dL
Hgb urine dipstick: NEGATIVE
Ketones, ur: NEGATIVE mg/dL
Leukocytes, UA: NEGATIVE
Nitrite: NEGATIVE
PH: 7 (ref 5.0–8.0)
Protein, ur: 30 mg/dL — AB
Specific Gravity, Urine: 1.02 (ref 1.005–1.030)
UROBILINOGEN UA: 0.2 mg/dL (ref 0.0–1.0)

## 2014-08-22 LAB — URINALYSIS, MICROSCOPIC ONLY
CASTS: NONE SEEN
Crystals: NONE SEEN
RBC / HPF: NONE SEEN RBC/hpf (ref <3)

## 2014-08-22 NOTE — Progress Notes (Signed)
Patient ID: Mackey BirchwoodRandall W Zimmerle MRN: [id number], DOB: 10/09/1965, 49 y.o. Date of Encounter: @DATE @  Chief Complaint:  Chief Complaint  Patient presents with  . UTI    treated at Urgent care for UTI Monday,  given injection and oral RX's, does not feel like getting better    HPI: 49 y.o. year old white male  presents with the following:  He states that his symptoms started on Friday 08/17/14. States that he started noticing frequent urination and also burning in his urethra. States that he never had any penile discharge. Never had any fevers. States that he had to work 48 hours straight on Saturday and Sunday so that's why he did not go for medical evaluation at that time.  Went to the [facility] on Monday 08/20/14. Says that he was told he had a "urinary infection" Says that he was treated with: ----An injection --- Azithromycin 500 mg--2 pills for 1 dose --- Cipro 500 mg twice a day for 2 days  Says that he was feeling better until this morning. Says that yesterday he felt normal and really did not notice any time the symptoms at all. Says this morning he started noticing the burning sensation again. Says that he feels the burning sensation off and on at times even when he is not urinating. Says that "it is not near as bad as it was last Friday- Sunday."  States he has no tenderness to the touch of the penis or the scrotum or any of his genital area. Also states that there is no tenderness or discomfort in the suprapubic area. Has had no fevers or chills over the last few days. Still has seen no penile discharge. No blood from the penis.   Past Medical History  Diagnosis Date  . GERD (gastroesophageal reflux disease)   . DDD (degenerative disc disease)   . ADHD (attention deficit hyperactivity disorder)   . Hyperlipidemia     Diet controlled  . ED (erectile dysfunction)   . Insomnia     Previously  . Alcohol consumption heavy      Home  Meds: Outpatient Prescriptions Prior to Visit  Medication Sig Dispense Refill  . testosterone cypionate (DEPOTESTOTERONE CYPIONATE) 200 MG/ML injection Inject 1 mL (200 mg total) into the muscle every 14 (fourteen) days. 2 mL 5   No facility-administered medications prior to visit.    Allergies: No Known Allergies  History   Social History  . Marital Status: Single    Spouse Name: N/A  . Number of Children: 1  . 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 Smoking status: Never Smoker   . Smokeless tobacco: Never Used  . Alcohol Use: Yes     Comment: several beers/wk  . Drug Use: No  . Sexual Activity: Not on file   Other Topics Concern  . Not on file   Social History Narrative   1 son-24 healthy   Lives alone       Family History  Problem Relation Age of Onset  . Diabetes Mother   . Dementia Mother   . Congestive Heart Failure Mother   . Hypertension Father   . Depression Sister      Review of Systems:  See HPI for pertinent ROS. All other ROS negative.    Physical Exam: Blood pressure 118/82, pulse 72, temperature 98.1 F (36.7 C), temperature source Oral, resp. rate 18, weight 225  lb (102.059 kg)., Body mass index is 28.88 kg/(m^2). General:WNWD WM.  Appears in no acute distress. Neck: Supple. No thyromegaly. No lymphadenopathy. Lungs: Clear bilaterally to auscultation without wheezes, rales, or rhonchi. Breathing is unlabored. Heart: RRR with S1 S2. No murmurs, rubs, or gallops. Abdomen: Soft, non-tender, non-distended with normoactive bowel sounds. No hepatomegaly. No rebound/guarding. No obvious abdominal masses. Musculoskeletal:  Strength and tone normal for age. Extremities/Skin: Warm and dry. Neuro: Alert and oriented X 3. Moves all extremities spontaneously. Gait is normal. CNII-XII grossly in tact. Psych:  Responds to questions appropriately with a normal affect.     ASSESSMENT AND  PLAN:  49 y.o. year old male with  1. Burning with urination - Urinalysis, Routine w reflex microscopic - GC/chlamydia probe amp, urine - Urine culture  I discussed case with the lab tech. On microscopic exam she saw no trichomonas. No significant amount of white cells or bacteria to suggest urine infection.  Discussed the situation with the patient as well. Explained to him that given the treatment used at the urgent care, it appears that they were treating gonorrhea and chlamydia. Suspect that the injection that he was given there was Rocephin. I have had him sign a medical release form to get the records from the urgent care.  At this point will wait to get results of urine culture and gonorrhea chlamydia probe prior to any further treatment. Told patient to call us if symptoms worsen in the interim. Otherwise we will call him once we get the lab results and follow-up at that point.   Signed, [address] [place], [place], [facility] 08/22/2014 1:35 PM

## 2014-08-23 LAB — GC/CHLAMYDIA PROBE AMP, URINE
Chlamydia, Swab/Urine, PCR: NEGATIVE
GC PROBE AMP, URINE: NEGATIVE

## 2014-08-24 LAB — URINE CULTURE
COLONY COUNT: NO GROWTH
Organism ID, Bacteria: NO GROWTH

## 2014-08-30 ENCOUNTER — Ambulatory Visit (INDEPENDENT_AMBULATORY_CARE_PROVIDER_SITE_OTHER): Payer: 59 | Admitting: Family Medicine

## 2014-08-30 ENCOUNTER — Encounter: Payer: Self-pay | Admitting: Family Medicine

## 2014-08-30 VITALS — BP 130/98 | HR 78 | Temp 98.2°F | Resp 16 | Ht 74.0 in | Wt 224.0 lb

## 2014-08-30 DIAGNOSIS — E291 Testicular hypofunction: Secondary | ICD-10-CM | POA: Diagnosis not present

## 2014-08-30 MED ORDER — TESTOSTERONE CYPIONATE 200 MG/ML IM SOLN: 400.0000 mg | INTRAMUSCULAR | Status: DC

## 2014-08-30 NOTE — Progress Notes (Signed)
Subjective:    Patient ID: Nicholas Sharp, male    DOB: [birth year], 49 y.o.   MRN: [id number]  HPI 07/03/14 Patient presents today complaining of feeling lousy. His symptoms are very vague. He seems frustrated and upset patient complains mainly of severe fatigue. He is also having almost daily pain located around both eyes. He does have a history of rosacea. He complains of burning pain in the eye and pain and pressure around the eyes. I have tried the patient on oral doxycycline without benefit for this. He is seen a near nose and throat physician in the past and had CT scans of his sinuses in the past all of which were normal. He is very frustrated by the persistent pain and burning that occurs on a daily basis around his eyes. On examination today however there is no visible abnormality in the eye. His vision is normal. His rosacea today is well controlled. He also complains of severe fatigue. He has no desire to do anything. He has to force himself to exercise. He is not getting pleasure out of his daily activities such as hunting and fishing. He reports apathy. He just sits at home and watches TV with no desire to do anything. However he bristles at the idea of depression. He denies any suicidal intent. He does agree that he has anhedonia.  AT that time, my plan was:  I believe the patient's burning sensation in his eyes is likely due to rosacea. I have recommended he see an ophthalmologist. The patient would like to schedule this on his iron with an eye doctor he is seen near his home in ED. I believe the most likely destination for his chronic fatigue is a combination of hypogonadism which he has a known history of in the past couple with most likely depression. Also based on his behavior today, his appearance, his affect, and his symptoms, the majority of his symptoms sound like depression. The patient seems dubious of this diagnosis. Therefore I will check lab work to monitor for other causes of  fatigue including a free and total testosterone level, CMP, CBC, TSH, and vitamin D. I will treat any obvious lab abnormalities. I will keep depression on the differential diagnosis. I truly believe this is most likely the patient's diagnosis.  08/30/14 Patient has been taking 200 mg of testosterone every 2 weeks. He states that his fatigue has improved just slightly. Lab work confirmed a very low total testosterone level of 191. Free testosterone was borderline low at 47. Patient states that in the past he has taken 400 mg every 3 weeks. At that time his fatigue actually improved. He continues to complain of anhedonia, apathy, and malaise. He admits that he may also be dealing with depression he drinks alcohol only on the weekends and limits his beers to less than 6 a weekend. He denies any other drug abuse. He denies any family history of mental illness or bipolar disorder. Past Medical History  Diagnosis Date  . GERD (gastroesophageal reflux disease)   . DDD (degenerative disc disease)   . ADHD (attention deficit hyperactivity disorder)   . Hyperlipidemia     Diet controlled  . ED (erectile dysfunction)   . Insomnia     Previously  . Alcohol consumption heavy    Past Surgical History  Procedure Laterality Date  . Shoulder surgery      x 2  left  . Knee surgery      x 2  right  . Esophagogastroduodenoscopy      Dr Magod-hiatal hernia, normal esophagus & esophageal bx, gastritis (no bx), normal D1/D2  . Esophagogastroduodenoscopy  02/17/2012    Procedure: ESOPHAGOGASTRODUODENOSCOPY (EGD);  Surgeon: Corbin Adeobert M Rourk, MD;  Location: [facility];  Service: Endoscopy;  Laterality: N/A;  3:45   No current outpatient prescriptions on file prior to visit.   No current facility-administered medications on file prior to visit.   No Known Allergies History   Social History  . Marital Status: Single    Spouse Name: N/A  . Number of Children: 1  . 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 Smoking status: Never Smoker   . Smokeless tobacco: Never Used  . Alcohol Use: Yes     Comment: several beers/wk  . Drug Use: No  . Sexual Activity: Not on file   Other Topics Concern  . Not on file   Social History Narrative   1 son-24 healthy   Lives alone         Review of Systems  All other systems reviewed and are negative.      Objective:   Physical Exam  Eyes: Conjunctivae and EOM are normal. Pupils are equal, round, and reactive to light. No scleral icterus.  Cardiovascular: Normal rate, regular rhythm and normal heart sounds.   Pulmonary/Chest: Effort normal and breath sounds normal.  Vitals reviewed.         Assessment & Plan:  Hypogonadism in male - Plan: testosterone cypionate (DEPOTESTOTERONE CYPIONATE) 200 MG/ML injection  I am willing to increase the patient's testosterone to 400 mg every 3 weeks. He is a very large man and young son is possible that he does need a slightly higher dose than the average patient with hypogonadism. I recommended that we check a serum testosterone level, CBC, and a PSA in 3 months. If the patient's symptoms are not improving after about 6 weeks, however, I would recommend starting the patient on Effexor for presumed depression. He agrees

## 2014-11-07 ENCOUNTER — Ambulatory Visit (INDEPENDENT_AMBULATORY_CARE_PROVIDER_SITE_OTHER): Payer: 59 | Admitting: Family Medicine

## 2014-11-07 ENCOUNTER — Encounter: Payer: Self-pay | Admitting: Family Medicine

## 2014-11-07 VITALS — BP 110/64 | HR 76 | Temp 98.0°F | Resp 14 | Ht 74.0 in | Wt 218.0 lb

## 2014-11-07 DIAGNOSIS — L299 Pruritus, unspecified: Secondary | ICD-10-CM | POA: Diagnosis not present

## 2014-11-07 DIAGNOSIS — J328 Other chronic sinusitis: Secondary | ICD-10-CM | POA: Diagnosis not present

## 2014-11-07 MED ORDER — PREDNISONE 20 MG PO TABS: ORAL_TABLET | ORAL | Status: DC

## 2014-11-07 MED ORDER — AMOXICILLIN 875 MG PO TABS: 875.0000 mg | ORAL_TABLET | Freq: Two times a day (BID) | ORAL | Status: DC

## 2014-11-07 MED ORDER — TRIAMCINOLONE ACETONIDE 0.1 % EX CREA: 1.0000 "application " | TOPICAL_CREAM | Freq: Two times a day (BID) | CUTANEOUS | Status: DC

## 2014-11-07 NOTE — Progress Notes (Signed)
Subjective:    Patient ID: Nicholas Sharp, male    DOB: 15-Nov-1965, 49 y.o.   MRN: [id number]  HPI Patient has been having a sore throat off and on for several weeks. He denies any fever. He denies any acid reflux. He denies any postnasal drip. However on examination today nasal mucosa is significantly swollen, there is rhinorrhea. There is significant mucus in the anterior nasopharynx. There is no lymphadenopathy in the neck.  He denies any cough. He does report itching on the back of his neck that has been off and on for years. On examination today there is no rash to be seen. It is limited to his neck and it only occurs at night when he is trying to sleep Past Medical History  Diagnosis Date  . GERD (gastroesophageal reflux disease)   . DDD (degenerative disc disease)   . ADHD (attention deficit hyperactivity disorder)   . Hyperlipidemia     Diet controlled  . ED (erectile dysfunction)   . Insomnia     Previously  . Alcohol consumption heavy    Past Surgical History  Procedure Laterality Date  . Shoulder surgery      x 2  left  . Knee surgery      x 2   right  . Esophagogastroduodenoscopy      Dr Magod-hiatal hernia, normal esophagus & esophageal bx, gastritis (no bx), normal D1/D2  . Esophagogastroduodenoscopy  02/17/2012    Procedure: ESOPHAGOGASTRODUODENOSCOPY (EGD);  Surgeon: Corbin Ade, MD;  Location: [facility];  Service: Endoscopy;  Laterality: N/A;  3:45   Current Outpatient Prescriptions on File Prior to Visit  Medication Sig Dispense Refill  . testosterone cypionate (DEPOTESTOTERONE CYPIONATE) 200 MG/ML injection Inject 2 mLs (400 mg total) into the muscle every 21 ( twenty-one) days. 8 mL 0   No current facility-administered medications on file prior to visit.   No Known Allergies Social History   Social History  . Marital Status: Single    Spouse Name: N/A  . Number of Children: 1  . 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 Smoking status: Never Smoker   . Smokeless tobacco: Never Used  . Alcohol Use: Yes     Comment: several beers/wk  . Drug Use: No  . Sexual Activity: Not on file   Other Topics Concern  . Not on file   Social History Narrative   1 son-24 healthy   Lives alone      Family History  Problem Relation Age of Onset  . Diabetes Mother   . Dementia Mother   . Congestive Heart Failure Mother   . Hypertension Father   . Depression Sister       Review of Systems  All other systems reviewed and are negative.      Objective:   Physical Exam  HENT:  Right Ear: External ear normal.  Left Ear: External ear normal.  Mouth/Throat: No oropharyngeal exudate, posterior oropharyngeal edema, posterior oropharyngeal erythema or tonsillar abscesses.  Eyes: Conjunctivae are normal.  Neck: Neck supple.  Cardiovascular: Normal rate, regular rhythm and normal heart sounds.   Pulmonary/Chest: Effort normal and breath sounds normal.  Lymphadenopathy:    He has no cervical adenopathy.  Vitals reviewed.         Assessment & Plan:  Other chronic sinusitis - Plan: amoxicillin (AMOXIL) 875 MG tablet, predniSONE (DELTASONE) 20 MG tablet  Itching -  Plan: triamcinolone cream (KENALOG) 0.1 %  I suspect the sore throat is due to postnasal drainage. This could either be due to a chronic sinus infection or allergies. He denies any history of allergies and therefore I'll treat it as a chronic sinus infection with amoxicillin as well as a prednisone taper pack. If it does not improve I'll pursue allergies further. He denies any acid reflux. I suspect the itching on the back of his neck is neurogenic in origin. I will treat it symptomatically with triamcinolone cream to be applied as needed. Consider amitriptyline if no better

## 2014-11-12 ENCOUNTER — Telehealth: Payer: Self-pay | Admitting: Family Medicine

## 2014-11-12 MED ORDER — ALPRAZOLAM 0.5 MG PO TABS
0.5000 mg | ORAL_TABLET | Freq: Four times a day (QID) | ORAL | Status: DC
Start: 2014-11-12 — End: 2014-12-04

## 2014-11-12 NOTE — Telephone Encounter (Signed)
Medication called/sent to requested pharmacy  

## 2014-11-12 NOTE — Telephone Encounter (Signed)
?   OK to Refill  

## 2014-11-12 NOTE — Telephone Encounter (Signed)
Ok with 30 xanax.

## 2014-11-12 NOTE — Telephone Encounter (Signed)
Patient called in didn't give a lot of details just said that he was prescribed xanax last year for a life style change "alcohol" and would like another prescription called into [facility] in [place].

## 2014-11-21 ENCOUNTER — Other Ambulatory Visit: Payer: Self-pay | Admitting: Family Medicine

## 2014-11-21 DIAGNOSIS — E291 Testicular hypofunction: Secondary | ICD-10-CM

## 2014-11-21 MED ORDER — TESTOSTERONE CYPIONATE 200 MG/ML IM SOLN
400.0000 mg | INTRAMUSCULAR | Status: DC
Start: 2014-11-21 — End: 2015-06-07

## 2014-11-21 NOTE — Telephone Encounter (Signed)
Medication called/sent to requested pharmacy  

## 2014-12-04 ENCOUNTER — Other Ambulatory Visit: Payer: Self-pay | Admitting: Family Medicine

## 2014-12-04 MED ORDER — ALPRAZOLAM 0.5 MG PO TABS: 0.5000 mg | ORAL_TABLET | Freq: Four times a day (QID) | ORAL | Status: DC

## 2014-12-04 NOTE — Telephone Encounter (Signed)
LRF 11/12/14 #30.  LOV 11/07/14  OK refill?

## 2014-12-04 NOTE — Telephone Encounter (Signed)
Medication refilled per protocol. 

## 2014-12-04 NOTE — Telephone Encounter (Signed)
ok 

## 2014-12-28 ENCOUNTER — Other Ambulatory Visit: Payer: Self-pay | Admitting: Family Medicine

## 2014-12-28 ENCOUNTER — Telehealth: Payer: Self-pay | Admitting: Family Medicine

## 2014-12-28 NOTE — Telephone Encounter (Signed)
Requesting refill on Xanax - ? OK to Refill  

## 2014-12-28 NOTE — Telephone Encounter (Signed)
LRF 12/04/14 #30  LOV 11/07/14  OK refill?

## 2014-12-28 NOTE — Telephone Encounter (Signed)
Too soon, not due until 10/6(ok to refill then).

## 2015-05-02 ENCOUNTER — Ambulatory Visit (INDEPENDENT_AMBULATORY_CARE_PROVIDER_SITE_OTHER): Payer: 59 | Admitting: Family Medicine

## 2015-05-02 ENCOUNTER — Encounter: Payer: Self-pay | Admitting: Family Medicine

## 2015-05-02 VITALS — BP 130/92 | HR 78 | Temp 97.9°F | Resp 18 | Wt 228.0 lb

## 2015-05-02 DIAGNOSIS — E291 Testicular hypofunction: Secondary | ICD-10-CM

## 2015-05-02 DIAGNOSIS — I10 Essential (primary) hypertension: Secondary | ICD-10-CM

## 2015-05-02 DIAGNOSIS — J328 Other chronic sinusitis: Secondary | ICD-10-CM | POA: Diagnosis not present

## 2015-05-02 DIAGNOSIS — J019 Acute sinusitis, unspecified: Secondary | ICD-10-CM

## 2015-05-02 MED ORDER — AMOXICILLIN 875 MG PO TABS: 875.0000 mg | ORAL_TABLET | Freq: Two times a day (BID) | ORAL | Status: DC

## 2015-05-02 NOTE — Progress Notes (Signed)
Subjective:    Patient ID: Nicholas Sharp, male    DOB: 1965-09-11, 50 y.o.   MRN: [id number]  HPI  Symptoms began 4 days ago. He reports sinus pressure, sinus pain, postnasal drip, and rhinorrhea. He has a scratchy throat secondary to the postnasal drip. He also has a nonproductive cough. He also reports bloody nasal discharge and a dull sinus headache. However her symptoms are gradually improving on their own. Past Medical History  Diagnosis Date  . GERD (gastroesophageal reflux disease)   . DDD (degenerative disc disease)   . ADHD (attention deficit hyperactivity disorder)   . Hyperlipidemia     Diet controlled  . ED (erectile dysfunction)   . Insomnia     Previously  . Alcohol consumption heavy    Past Surgical History  Procedure Laterality Date  . Shoulder surgery      x 2  left  . Knee surgery      x 2   right  . Esophagogastroduodenoscopy      Dr Magod-hiatal hernia, normal esophagus & esophageal bx, gastritis (no bx), normal D1/D2  . Esophagogastroduodenoscopy  02/17/2012    Procedure: ESOPHAGOGASTRODUODENOSCOPY (EGD);  Surgeon: Corbin Ade, MD;  Location: [facility];  Service: Endoscopy;  Laterality: N/A;  3:45   Current Outpatient Prescriptions on File Prior to Visit  Medication Sig Dispense Refill  . ALPRAZolam (XANAX) 0.5 MG tablet Take 1 tablet (0.5 mg total) by mouth every 6 (six) hours. 30 tablet 0  . amoxicillin (AMOXIL) 875 MG tablet Take 1 tablet (875 mg total) by mouth 2 (two) times daily. 20 tablet 0  . predniSONE (DELTASONE) 20 MG tablet 3 tabs poqday 1-2, 2 tabs poqday 3-4, 1 tab poqday 5-6 12 tablet 0  . testosterone cypionate (DEPOTESTOSTERONE CYPIONATE) 200 MG/ML injection Inject 2 mLs (400 mg total) into the muscle every 21 ( twenty-one) days. 8 mL 0  . triamcinolone cream (KENALOG) 0.1 % Apply 1 application topically 2 (two) times daily. 80 g 0   No current facility-administered medications on file prior to visit.   No Known  Allergies Social History   Social History  . Marital Status: Single    Spouse Name: N/A  . Number of Children: 1  . 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 Smoking status: Never Smoker   . Smokeless tobacco: Never Used  . Alcohol Use: Yes     Comment: several beers/wk  . Drug Use: No  . Sexual Activity: Not on file   Other Topics Concern  . Not on file   Social History Narrative   1 son-24 healthy   Lives alone      Family History  Problem Relation Age of Onset  . Diabetes Mother   . Dementia Mother   . Congestive Heart Failure Mother   . Hypertension Father   . Depression Sister       Review of Systems  All other systems reviewed and are negative.      Objective:   Physical Exam  HENT:  Right Ear: Tympanic membrane, external ear and ear canal normal.  Left Ear: Tympanic membrane, external ear and ear canal normal.  Nose: Mucosal edema and rhinorrhea present. Right sinus exhibits no maxillary sinus tenderness and no frontal sinus tenderness. Left sinus exhibits no maxillary sinus tenderness and no frontal sinus tenderness.  Mouth/Throat: Oropharynx is clear and moist. No oropharyngeal exudate, posterior oropharyngeal edema,  posterior oropharyngeal erythema or tonsillar abscesses.  Eyes: Conjunctivae are normal.  Neck: Neck supple.  Cardiovascular: Normal rate, regular rhythm and normal heart sounds.   Pulmonary/Chest: Effort normal and breath sounds normal.  Lymphadenopathy:    He has no cervical adenopathy.  Vitals reviewed.         Assessment & Plan:  Other chronic sinusitis  Acute rhinosinusitis - Plan: amoxicillin (AMOXIL) 875 MG tablet  Hypogonadism in male - Plan: PSA, Testosterone  Essential hypertension, benign - Plan: CBC with Differential/Platelet, COMPLETE METABOLIC PANEL WITH GFR, Lipid panel  At the present time, I believe the patient is dealing with a viral upper  respiratory infection. I recommended tincture of time. He can use Sudafed for congestion, Mucinex for cough, and ibuprofen for headache. Should symptoms worsen or persist longer than 10 days and he developed a fever and purulent nasal discharge and severe sinus pain, I did give him prescription for amoxicillin but he is not to get that unless symptoms drastically worsened. He also has a history of hypogonadism currently on testosterone and hypertension. I would like him to return fasting for lab work pertaining to these issues. He also has started drinking again and I recommended cessation of all alcohol.

## 2015-06-05 ENCOUNTER — Other Ambulatory Visit: Payer: 59

## 2015-06-05 LAB — COMPLETE METABOLIC PANEL WITH GFR
ALT: 54 U/L — ABNORMAL HIGH (ref 9–46)
AST: 35 U/L (ref 10–40)
Albumin: 4.3 g/dL (ref 3.6–5.1)
Alkaline Phosphatase: 65 U/L (ref 40–115)
BUN: 16 mg/dL (ref 7–25)
CHLORIDE: 99 mmol/L (ref 98–110)
CO2: 27 mmol/L (ref 20–31)
Calcium: 9.1 mg/dL (ref 8.6–10.3)
Creat: 0.87 mg/dL (ref 0.60–1.35)
GFR, Est Non African American: >89 mL/min (ref >=60)
Glucose, Bld: 84 mg/dL (ref 70–99)
Potassium: 4 mmol/L (ref 3.5–5.3)
Sodium: 136 mmol/L (ref 135–146)
Total Bilirubin: 0.8 mg/dL (ref 0.2–1.2)
Total Protein: 6.9 g/dL (ref 6.1–8.1)

## 2015-06-05 LAB — TESTOSTERONE: TESTOSTERONE: 154 ng/dL — AB (ref 250–827)

## 2015-06-05 LAB — CBC WITH DIFFERENTIAL/PLATELET
Basophils Absolute: 0 10*3/uL (ref 0.0–0.1)
Basophils Relative: 1 % (ref 0–1)
EOS PCT: 2 % (ref 0–5)
Eosinophils Absolute: 0.1 10*3/uL (ref 0.0–0.7)
HEMATOCRIT: 50.9 % (ref 39.0–52.0)
Hemoglobin: 17.5 g/dL — ABNORMAL HIGH (ref 13.0–17.0)
LYMPHS ABS: 1.4 10*3/uL (ref 0.7–4.0)
LYMPHS PCT: 34 % (ref 12–46)
MCH: 30.3 pg (ref 26.0–34.0)
MCHC: 34.4 g/dL (ref 30.0–36.0)
MCV: 88.1 fL (ref 78.0–100.0)
MPV: 10.4 fL (ref 8.6–12.4)
Monocytes Absolute: 0.5 10*3/uL (ref 0.1–1.0)
Monocytes Relative: 11 % (ref 3–12)
Neutro Abs: 2.2 10*3/uL (ref 1.7–7.7)
Neutrophils Relative %: 52 % (ref 43–77)
Platelets: 207 10*3/uL (ref 150–400)
RBC: 5.78 MIL/uL (ref 4.22–5.81)
RDW: 13.5 % (ref 11.5–15.5)
WBC: 4.2 10*3/uL (ref 4.0–10.5)

## 2015-06-05 LAB — LIPID PANEL
Cholesterol: 228 mg/dL — ABNORMAL HIGH (ref 125–200)
HDL: 38 mg/dL — AB (ref >=40)
LDL CALC: 128 mg/dL (ref <130)
Total CHOL/HDL Ratio: 6 Ratio — ABNORMAL HIGH (ref <=5.0)
Triglycerides: 312 mg/dL — ABNORMAL HIGH (ref <150)
VLDL: 62 mg/dL — ABNORMAL HIGH (ref <30)

## 2015-06-06 LAB — PSA: PSA: 0.67 ng/mL (ref <=4.00)

## 2015-06-07 ENCOUNTER — Other Ambulatory Visit: Payer: Self-pay | Admitting: Family Medicine

## 2015-06-07 DIAGNOSIS — E291 Testicular hypofunction: Secondary | ICD-10-CM

## 2015-06-07 MED ORDER — TESTOSTERONE CYPIONATE 200 MG/ML IM SOLN: 400.0000 mg | INTRAMUSCULAR | Status: DC

## 2015-06-07 MED ORDER — ATORVASTATIN CALCIUM 20 MG PO TABS: 20.0000 mg | ORAL_TABLET | Freq: Every day | ORAL | Status: DC

## 2015-07-05 ENCOUNTER — Encounter: Payer: Self-pay | Admitting: Family Medicine

## 2015-07-05 ENCOUNTER — Ambulatory Visit (INDEPENDENT_AMBULATORY_CARE_PROVIDER_SITE_OTHER): Payer: 59 | Admitting: Family Medicine

## 2015-07-05 VITALS — BP 122/84 | Ht 74.0 in | Wt 225.2 lb

## 2015-07-05 DIAGNOSIS — I889 Nonspecific lymphadenitis, unspecified: Secondary | ICD-10-CM

## 2015-07-05 MED ORDER — AZITHROMYCIN 250 MG PO TABS: ORAL_TABLET | ORAL | Status: DC

## 2015-07-05 NOTE — Progress Notes (Signed)
   Subjective:    Patient ID: Nicholas Sharp, male    DOB: 11/29/1965, 50 y.o.   MRN: [id number]  HPI  Patient arrives with knot on neck for 4 days.  Came up four or five days ago  trender and sore at ofrs  No dif swallowig     no major headache no sore throat.  Felt a bump him up right-sided neck. Quite tender in nature. Patient bit worried about something such as cancer    Review of Systems  no cough no congestion no drainage no chest pain no abdominal pain    Objective:   Physical Exam   alert vital stable HEENT normal other than a right tender anterior cervical lymph node. Pharynx normal gums normal slight abrasion on rhythm roof of  Lymph node secondary to razor      Assessment & Plan:   impression focal lymphadenitis discuss #2 elevated liver enzymes briefly discuss patient would like to delve into this further working on diet and has cut back alcohol intake plan Z-Pak. Symptom care discussed warning signs discuss WSL

## 2015-08-09 ENCOUNTER — Telehealth: Payer: Self-pay | Admitting: Family Medicine

## 2015-08-09 MED ORDER — BENZONATATE 100 MG PO CAPS: 100.0000 mg | ORAL_CAPSULE | Freq: Four times a day (QID) | ORAL | Status: DC | PRN

## 2015-08-09 NOTE — Telephone Encounter (Signed)
Pt called stating that he has a bad cough and runny nose and is going out of town this weekend and wants to know if something can be called in for the cough.    [facility] [place]

## 2015-08-09 NOTE — Telephone Encounter (Signed)
Tess perles 100 mg one q 6 prn 24

## 2015-08-09 NOTE — Telephone Encounter (Signed)
Rx sent electronically to pharmacy. Patient notified. 

## 2015-08-19 ENCOUNTER — Encounter: Payer: Self-pay | Admitting: Family Medicine

## 2015-08-19 ENCOUNTER — Ambulatory Visit (INDEPENDENT_AMBULATORY_CARE_PROVIDER_SITE_OTHER): Payer: 59 | Admitting: Family Medicine

## 2015-08-19 VITALS — BP 130/88 | Temp 98.5°F | Ht 74.0 in | Wt 225.1 lb

## 2015-08-19 DIAGNOSIS — J329 Chronic sinusitis, unspecified: Secondary | ICD-10-CM

## 2015-08-19 MED ORDER — HYDROCODONE-HOMATROPINE 5-1.5 MG/5ML PO SYRP: ORAL_SOLUTION | ORAL | Status: DC

## 2015-08-19 MED ORDER — AMOXICILLIN-POT CLAVULANATE 875-125 MG PO TABS
1.0000 | ORAL_TABLET | Freq: Two times a day (BID) | ORAL | Status: DC
Start: 2015-08-19 — End: 2015-09-06

## 2015-08-19 NOTE — Progress Notes (Signed)
   Subjective:    Patient ID: Nicholas Sharp, male    DOB: 10/04/1965, 50 y.o.   MRN: [id number]  Cough This is a new problem. The current episode started 1 to 4 weeks ago. The cough is productive of sputum. Associated symptoms include headaches, rhinorrhea and a sore throat. Treatments tried: Delsym, Tessalon, vitamin C, Alka Seltzer cold plus.   Patient states no other concerns this visit.  Gunky procutive at times  yelow  Cong  Sinus h a  Felt a little feverish but not bad  Energy level down a bit    No si g allergies at this time       Review of Systems  HENT: Positive for rhinorrhea and sore throat.   Respiratory: Positive for cough.   Neurological: Positive for headaches.       Objective:   Physical Exam  Alert, mild malaise. Hydration good Vitals stable. frontal/ maxillary tenderness evident positive nasal congestion. pharynx normal neck supple  lungs clear/no crackles or wheezes. heart regular in rhythm       Assessment & Plan:  Impression rhinosinusitis likely post viral, discussed with patient. plan antibiotics prescribed. Questions answered. Symptomatic care discussed. warning signs discussed. WSL

## 2015-09-06 ENCOUNTER — Encounter: Payer: Self-pay | Admitting: Family Medicine

## 2015-09-06 ENCOUNTER — Ambulatory Visit (INDEPENDENT_AMBULATORY_CARE_PROVIDER_SITE_OTHER): Payer: 59 | Admitting: Family Medicine

## 2015-09-06 VITALS — BP 112/74 | Ht 74.0 in | Wt 227.0 lb

## 2015-09-06 DIAGNOSIS — R21 Rash and other nonspecific skin eruption: Secondary | ICD-10-CM

## 2015-09-06 DIAGNOSIS — R748 Abnormal levels of other serum enzymes: Secondary | ICD-10-CM

## 2015-09-06 DIAGNOSIS — Z139 Encounter for screening, unspecified: Secondary | ICD-10-CM

## 2015-09-06 DIAGNOSIS — I889 Nonspecific lymphadenitis, unspecified: Secondary | ICD-10-CM | POA: Diagnosis not present

## 2015-09-06 DIAGNOSIS — E785 Hyperlipidemia, unspecified: Secondary | ICD-10-CM

## 2015-09-06 NOTE — Progress Notes (Signed)
   Subjective:    Patient ID: Nicholas Sharp, male    DOB: 02/28/1966, 50 y.o.   MRN: [id number] Patient arrives office with several concerns. HPIFollow up cervical lymphadenitis.  Neck has faded overall feeling better, took all his antibiotics up.   Red bumps on stomach for years. abd and chest chronic red inflam bumbps never completely go away. flzres up after heat exposure  Alcohol intake better but not perfect  Had elev of liver enzues , test lwonderied if that tis better now off test . Was on testosterone was concerned that it may be adding to the liver enzyme increase.  Was prescribed lipitor  By [facility] fam doc did not get , was concerned about potential side effects. Has a history of known hyperlipidemia  Had calcium arterial study was way low . This per the cardiologist. Which of course was good news.  Now coming up on legs.   Back to exercise, incorporating more healthy with diet,      Review of Systems No headache, no major weight loss or weight gain, no chest pain no back pain abdominal pain no change in bowel habits complete ROS otherwise negative     Objective:   Physical Exam  Alert no acute distress. HEENT normal. Lungs clear. Heart rare rhythm. Abdomen and chest nonspecific slight follicular rash noted similar on legs. Lymphadenitis cervical anterior neck now resolved      Assessment & Plan:  Impression 1 cervical lymphadenitis clinically resolved #2 nonspecific rash does not warrant further workup or treatment at this time. #3 history of elevated liver enzymes status uncertain #4 history testosterone and set use potentially associated with the liver enzyme elevation #5 hyperlipidemia status uncertain plan diet exercise discussed. Encouraged to cut back alcohol intake. Appropriate blood work further recommendations based results WSL

## 2015-11-14 ENCOUNTER — Encounter: Payer: Self-pay | Admitting: Family Medicine

## 2015-11-14 ENCOUNTER — Ambulatory Visit (INDEPENDENT_AMBULATORY_CARE_PROVIDER_SITE_OTHER): Payer: 59 | Admitting: Family Medicine

## 2015-11-14 VITALS — BP 118/78 | Ht 74.0 in | Wt 231.0 lb

## 2015-11-14 DIAGNOSIS — M791 Myalgia: Secondary | ICD-10-CM

## 2015-11-14 DIAGNOSIS — M7918 Myalgia, other site: Secondary | ICD-10-CM

## 2015-11-14 MED ORDER — TIZANIDINE HCL 4 MG PO TABS: 4.0000 mg | ORAL_TABLET | Freq: Four times a day (QID) | ORAL | 0 refills | Status: DC | PRN

## 2015-11-14 MED ORDER — OXYCODONE-ACETAMINOPHEN 5-325 MG PO TABS: 1.0000 | ORAL_TABLET | ORAL | 0 refills | Status: DC | PRN

## 2015-11-14 NOTE — Progress Notes (Signed)
   Subjective:    Patient ID: Mackey Birchwoodandall W Martin, male    DOB: 02/24/1966, 50 y.o.   MRN: [id number]  Back Pain  This is a new problem. Episode onset: 4 -5 days ago. Pain location: between shoulder blades. He has tried NSAIDs, chiropractic manipulation, heat and ice for the symptoms. The treatment provided no relief.   Relates up her back pain goes underneath the left scapula. Hurts with certain movements. Does not know of any way he triggered it. Denies any arm pain no chest congestion   Review of Systems  Musculoskeletal: Positive for back pain.   Denies breathing difficulties chest tightness    Objective:   Physical Exam  Lungs clear hearts regular Significant tenderness and trapezius rhomboid region on the back. Low back nontender. Good range of motion lower back decreased range of motion upper back      Assessment & Plan:  Work excuse given Ibuprofen 600 mg 3 times a day Muscle relaxers on a regular basis cautioned drowsiness home use only Pain medication home use only caution drowsiness If ongoing troubles possible referral to physical therapy follow-up if worse

## 2015-11-18 ENCOUNTER — Telehealth: Payer: Self-pay | Admitting: Family Medicine

## 2015-11-18 NOTE — Telephone Encounter (Signed)
Seen just last thur often takes a week for this type of muscles strain to calm down under prescription level anti inflam, sometimes more, very hard to predict, rec re evaluate mid this wk if persists

## 2015-11-18 NOTE — Telephone Encounter (Signed)
Pt called stating that his back has gotten worse and that he is out of work again today because of it. Pt wants to know if he should continue to give the medication that he was given, time to work. Please advise.

## 2015-11-19 NOTE — Telephone Encounter (Signed)
Spoke with patient and informed him per Dr.Steve Luking- Seen just last thursday often takes a week for this type of muscles strain to calm down under prescription level anti inflammatories, sometimes more, very hard to predict, recommend re evaluate mid this week if persists. Patient verbalized understanding.

## 2015-11-25 ENCOUNTER — Telehealth: Payer: Self-pay | Admitting: Family Medicine

## 2015-11-25 MED ORDER — TIZANIDINE HCL 4 MG PO TABS: 4.0000 mg | ORAL_TABLET | Freq: Four times a day (QID) | ORAL | 0 refills | Status: DC | PRN

## 2015-11-25 MED ORDER — OXYCODONE-ACETAMINOPHEN 5-325 MG PO TABS: 1.0000 | ORAL_TABLET | ORAL | 0 refills | Status: DC | PRN

## 2015-11-25 NOTE — Telephone Encounter (Signed)
Zanaflex sent electronically to pharmacy and Oxycodone printed and left up front for patient pick up. Patient needs follow up office visit this week with Dr Brett CanalesSteve. Left message to return call.

## 2015-11-25 NOTE — Telephone Encounter (Signed)
Refill same meds an o v later this wk

## 2015-11-25 NOTE — Telephone Encounter (Signed)
Pt called stating that his back is still not any better and that he has one day's worth of medication left. He will need a refill on his muscle relaxers and pain medication. Pt also wants to know what he should do about the pain. Please advise.

## 2015-11-25 NOTE — Telephone Encounter (Addendum)
Patient was seen 8/17 and dx with rhomboid muscle pain-given oxycodone 5/325mg   #30 and zanaflex 4mg 

## 2015-11-25 NOTE — Telephone Encounter (Signed)
Patient notified and scheduled follow up office visit this week.

## 2015-11-28 ENCOUNTER — Encounter: Payer: Self-pay | Admitting: Family Medicine

## 2015-11-28 ENCOUNTER — Ambulatory Visit (INDEPENDENT_AMBULATORY_CARE_PROVIDER_SITE_OTHER): Payer: 59 | Admitting: Family Medicine

## 2015-11-28 VITALS — BP 132/70 | Ht 74.0 in | Wt 235.0 lb

## 2015-11-28 DIAGNOSIS — M791 Myalgia: Secondary | ICD-10-CM | POA: Diagnosis not present

## 2015-11-28 DIAGNOSIS — M7918 Myalgia, other site: Secondary | ICD-10-CM

## 2015-11-28 MED ORDER — TIZANIDINE HCL 4 MG PO TABS: 4.0000 mg | ORAL_TABLET | Freq: Four times a day (QID) | ORAL | 0 refills | Status: DC | PRN

## 2015-11-28 MED ORDER — OXYCODONE-ACETAMINOPHEN 5-325 MG PO TABS: 1.0000 | ORAL_TABLET | ORAL | 0 refills | Status: DC | PRN

## 2015-11-28 MED ORDER — MELOXICAM 15 MG PO TABS: 15.0000 mg | ORAL_TABLET | Freq: Every day | ORAL | 1 refills | Status: DC

## 2015-11-28 NOTE — Progress Notes (Signed)
   Subjective:    Patient ID: Mackey Birchwoodandall W Tkach, male    DOB: 08/24/1965, 50 y.o.   MRN: [id number]  Back Pain  This is a new problem. The current episode started 1 to 4 weeks ago. Treatments tried: oxycodone and zanaflex. The treatment provided significant relief.   Traction oin neck and dry needling Has helped some via Doctor of physical therapy Next  Patient frustrated by ongoing pain. 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 Developed left. Scapular deep aching pain which still persists. Worse with certain motions.     Review of Systems  Musculoskeletal: Positive for back pain.       Objective:   Physical Exam Alert vital stable lungs clear heart regular rhythm spine nontender positive paraspinal tenderness left rhomboid region shoulder good range of motion       Assessment & Plan:  Impression persistent rhomboid strain nature of this discussed at length often takes weeks to months resolve plan continue physical therapy add daily mobic symptom care discussed pain medicine refilled expect slow resolution

## 2016-01-13 ENCOUNTER — Encounter: Payer: Self-pay | Admitting: Family Medicine

## 2016-01-13 ENCOUNTER — Ambulatory Visit (INDEPENDENT_AMBULATORY_CARE_PROVIDER_SITE_OTHER): Payer: 59 | Admitting: Family Medicine

## 2016-01-13 VITALS — BP 138/82 | Temp 98.2°F | Ht 74.0 in | Wt 229.6 lb

## 2016-01-13 DIAGNOSIS — A084 Viral intestinal infection, unspecified: Secondary | ICD-10-CM | POA: Diagnosis not present

## 2016-01-13 NOTE — Progress Notes (Signed)
   Subjective:    Patient ID: Nicholas Sharp, male    DOB: 10/10/1965, 50 y.o.   MRN: [id number]  HPI Patient arrives with c/o abdominal pain and diarrhea for 24 hours. Patient states he has noticed blood in his stool.  Pt had terrible pains  Profuse sweating and bad cramping  Took pepto bismol  Ok  Bowels  Sat was doing fine  At work all day yesterday  Ham and cheese   very crampoing  .stayed on commode, for   Was sig hurting   not usual appetite cup of cofee and almonds and pecan s and cranbertires   Review of Systems No high fevers no chest pain no rash    Objective:   Physical Exam Alert vital stable lungs clear heart rare rhythm abdomen diffuse mild tenderness. Left lower quadrant not discrete hyperactive bowel sounds noted       Assessment & Plan:  Probable toxin mediated food poisoning discussed 8 an old ham some sandwich that had been out in the heat for long period of time. Symptom care only expect gradual resolution discussed, Patient brings in blood work results to be scanned overall stable

## 2016-01-30 ENCOUNTER — Ambulatory Visit (INDEPENDENT_AMBULATORY_CARE_PROVIDER_SITE_OTHER): Payer: 59 | Admitting: Family Medicine

## 2016-01-30 ENCOUNTER — Encounter: Payer: Self-pay | Admitting: Family Medicine

## 2016-01-30 VITALS — BP 130/90 | Temp 98.7°F | Ht 74.0 in | Wt 228.1 lb

## 2016-01-30 DIAGNOSIS — J019 Acute sinusitis, unspecified: Secondary | ICD-10-CM | POA: Diagnosis not present

## 2016-01-30 MED ORDER — HYDROCODONE-HOMATROPINE 5-1.5 MG/5ML PO SYRP: 5.0000 mL | ORAL_SOLUTION | Freq: Every evening | ORAL | 0 refills | Status: DC | PRN

## 2016-01-30 MED ORDER — AMOXICILLIN-POT CLAVULANATE 875-125 MG PO TABS: 1.0000 | ORAL_TABLET | Freq: Two times a day (BID) | ORAL | 0 refills | Status: AC

## 2016-01-30 MED ORDER — BENZONATATE 100 MG PO CAPS: 100.0000 mg | ORAL_CAPSULE | Freq: Four times a day (QID) | ORAL | 0 refills | Status: DC | PRN

## 2016-01-30 NOTE — Progress Notes (Signed)
   Subjective:    Patient ID: Nicholas Sharp, male    DOB: 05/31/1965, 50 y.o.   MRN: [id number]  Sinusitis  This is a new problem. The current episode started in the past 7 days. The problem is unchanged. The pain is moderate. Associated symptoms include congestion, coughing, headaches and a sore throat. Treatments tried: Clarene ReamerAlka Seltzer, Delsym. The treatment provided no relief.  wiped out, coughing sore eyes, sore throat and felt raw  feveruish and chills cold and sweaty   Delsym prn   Pos prod cough dry hacking   Frontal headaches frontal    aug 875     Review of Systems  HENT: Positive for congestion and sore throat.   Respiratory: Positive for cough.   Neurological: Positive for headaches.       Objective:   Physical Exam Alert, mild malaise. Hydration good Vitals stable. frontal/ maxillary tenderness evident positive nasal congestion. pharynx normal neck supple  lungs clear/no crackles or wheezes. heart regular in rhythm        Assessment & Plan:  Impression rhinosinusitis likely post viral, discussed with patient. plan antibiotics prescribed. Questions answered. Symptomatic care discussed. warning signs discussed. WSL Patient advises likely started as parainfluenza, discussed

## 2016-05-08 ENCOUNTER — Encounter: Payer: Self-pay | Admitting: Family Medicine

## 2016-05-08 ENCOUNTER — Ambulatory Visit (INDEPENDENT_AMBULATORY_CARE_PROVIDER_SITE_OTHER): Payer: 59 | Admitting: Family Medicine

## 2016-05-08 VITALS — BP 128/86 | Ht 74.0 in | Wt 239.0 lb

## 2016-05-08 DIAGNOSIS — M5440 Lumbago with sciatica, unspecified side: Secondary | ICD-10-CM

## 2016-05-08 DIAGNOSIS — Z23 Encounter for immunization: Secondary | ICD-10-CM

## 2016-05-08 MED ORDER — OXYCODONE-ACETAMINOPHEN 7.5-325 MG PO TABS: ORAL_TABLET | ORAL | 0 refills | Status: DC

## 2016-05-08 MED ORDER — PREDNISONE 20 MG PO TABS: ORAL_TABLET | ORAL | 0 refills | Status: DC

## 2016-05-08 NOTE — Progress Notes (Signed)
   Subjective:    Patient ID: Nicholas Sharp, male    DOB: 05/30/1965, 51 y.o.   MRN: [id number]  Back Pain  This is a new problem. The current episode started in the past 7 days. The pain is present in the lumbar spine. The symptoms are aggravated by position, sitting and lying down. He has tried muscle relaxant for the symptoms. The treatment provided no relief.   Sunday the pain hit  Hit fairly hard  On sat was doing some sig lifting and then the next day  Symmetric low back diffuse, bilat  Sharp at times and radiates to the calves bioat  Pt took meloxicam and muscle relaxant and pain ''  Feels nerve inflamm into the right foot pt calls it "sciatica" Did pawn a heavy dear Weyman CroonStan day prior to recurrence of pain    Review of Systems  Musculoskeletal: Positive for back pain.       Objective:   Physical Exam Alert vital stable hydration good. Lungs clear heart rare rhythm diffuse low lumbar tenderness. Negative straight leg raise Patient is clearly in some discomfort      Assessment & Plan:  Lumbar strain with secondary nerve root involvement discussed at length plan prednisone taper. Work excuse. I code on when necessary for pain symptom care discussed versus return for further discussion and imaging

## 2016-05-15 ENCOUNTER — Encounter: Payer: Self-pay | Admitting: Family Medicine

## 2016-05-23 DIAGNOSIS — T148XXA Other injury of unspecified body region, initial encounter: Secondary | ICD-10-CM | POA: Diagnosis not present

## 2016-07-13 DIAGNOSIS — M25561 Pain in right knee: Secondary | ICD-10-CM | POA: Diagnosis not present

## 2016-07-13 DIAGNOSIS — M25461 Effusion, right knee: Secondary | ICD-10-CM | POA: Diagnosis not present

## 2016-07-16 DIAGNOSIS — M7041 Prepatellar bursitis, right knee: Secondary | ICD-10-CM | POA: Diagnosis not present

## 2016-08-21 ENCOUNTER — Ambulatory Visit (INDEPENDENT_AMBULATORY_CARE_PROVIDER_SITE_OTHER): Payer: 59 | Admitting: Nurse Practitioner

## 2016-08-21 ENCOUNTER — Encounter: Payer: Self-pay | Admitting: Nurse Practitioner

## 2016-08-21 VITALS — BP 128/88 | Temp 98.0°F | Ht 74.0 in | Wt 228.0 lb

## 2016-08-21 DIAGNOSIS — Z79899 Other long term (current) drug therapy: Secondary | ICD-10-CM | POA: Diagnosis not present

## 2016-08-21 DIAGNOSIS — Z13220 Encounter for screening for lipoid disorders: Secondary | ICD-10-CM

## 2016-08-21 DIAGNOSIS — Z125 Encounter for screening for malignant neoplasm of prostate: Secondary | ICD-10-CM

## 2016-08-21 DIAGNOSIS — I10 Essential (primary) hypertension: Secondary | ICD-10-CM

## 2016-08-21 DIAGNOSIS — R1030 Lower abdominal pain, unspecified: Secondary | ICD-10-CM

## 2016-08-21 LAB — POCT URINALYSIS DIPSTICK
Spec Grav, UA: >=1.03 — AB (ref 1.010–1.025)
pH, UA: 5 (ref 5.0–8.0)

## 2016-08-21 LAB — POCT UA - MICROSCOPIC ONLY
BACTERIA, U MICROSCOPIC: NEGATIVE
RBC, URINE, MICROSCOPIC: NEGATIVE
WBC, Ur, HPF, POC: NEGATIVE

## 2016-08-22 LAB — BASIC METABOLIC PANEL
BUN/Creatinine Ratio: 25 — ABNORMAL HIGH (ref 9–20)
BUN: 19 mg/dL (ref 6–24)
CO2: 25 mmol/L (ref 18–29)
CREATININE: 0.75 mg/dL — AB (ref 0.76–1.27)
Calcium: 9.3 mg/dL (ref 8.7–10.2)
Chloride: 101 mmol/L (ref 96–106)
GFR, EST AFRICAN AMERICAN: 124 mL/min/{1.73_m2} (ref >59)
GFR, EST NON AFRICAN AMERICAN: 107 mL/min/{1.73_m2} (ref >59)
Glucose: 92 mg/dL (ref 65–99)
Potassium: 4.6 mmol/L (ref 3.5–5.2)
Sodium: 141 mmol/L (ref 134–144)

## 2016-08-22 LAB — CBC WITH DIFFERENTIAL/PLATELET
BASOS: 0 %
Basophils Absolute: 0 10*3/uL (ref 0.0–0.2)
EOS (ABSOLUTE): 0.1 10*3/uL (ref 0.0–0.4)
EOS: 2 %
HEMOGLOBIN: 15.8 g/dL (ref 13.0–17.7)
Hematocrit: 45.8 % (ref 37.5–51.0)
IMMATURE GRANS (ABS): 0 10*3/uL (ref 0.0–0.1)
IMMATURE GRANULOCYTES: 0 %
LYMPHS: 29 %
Lymphocytes Absolute: 1.4 10*3/uL (ref 0.7–3.1)
MCH: 29.5 pg (ref 26.6–33.0)
MCHC: 34.5 g/dL (ref 31.5–35.7)
MCV: 86 fL (ref 79–97)
MONOCYTES: 10 %
Monocytes Absolute: 0.5 10*3/uL (ref 0.1–0.9)
NEUTROS ABS: 2.9 10*3/uL (ref 1.4–7.0)
NEUTROS PCT: 59 %
Platelets: 241 10*3/uL (ref 150–379)
RBC: 5.35 x10E6/uL (ref 4.14–5.80)
RDW: 13.8 % (ref 12.3–15.4)
WBC: 4.9 10*3/uL (ref 3.4–10.8)

## 2016-08-22 LAB — HEPATIC FUNCTION PANEL
ALBUMIN: 4.6 g/dL (ref 3.5–5.5)
ALT: 44 IU/L (ref 0–44)
AST: 36 IU/L (ref 0–40)
Alkaline Phosphatase: 91 IU/L (ref 39–117)
BILIRUBIN TOTAL: 0.4 mg/dL (ref 0.0–1.2)
BILIRUBIN, DIRECT: 0.09 mg/dL (ref 0.00–0.40)
TOTAL PROTEIN: 6.9 g/dL (ref 6.0–8.5)

## 2016-08-22 LAB — LIPID PANEL
CHOL/HDL RATIO: 5.1 ratio — AB (ref 0.0–5.0)
Cholesterol, Total: 225 mg/dL — ABNORMAL HIGH (ref 100–199)
HDL: 44 mg/dL (ref >39)
LDL Calculated: 144 mg/dL — ABNORMAL HIGH (ref 0–99)
TRIGLYCERIDES: 186 mg/dL — AB (ref 0–149)
VLDL Cholesterol Cal: 37 mg/dL (ref 5–40)

## 2016-08-22 LAB — PSA: Prostate Specific Ag, Serum: 0.8 ng/mL (ref 0.0–4.0)

## 2016-08-23 ENCOUNTER — Encounter: Payer: Self-pay | Admitting: Nurse Practitioner

## 2016-08-23 NOTE — Progress Notes (Signed)
Subjective:  Presents for c/o "stinging" lower abd pain for the past week. Was constant at first; has gotten better. No fever. Radiates occasionally into the inguinal area. Also burning pain at times. No urgency, frequency or dysuria. No change in his stream. No testicular pain or swelling. No penile discharge. No new sexual partners. Defers STD testing. No obvious hematuria. No constipation, diarrhea or blood in his stools. No back pain. No N/V. Is scheduled for his wellness exam next week. Pain seems to be exacerbated by certain activities such as riding a Surveyor, mininglawn mower or using his tiller in his garden.   Objective:   BP 128/88   Temp 98 F (36.7 C) (Oral)   Ht 6\' 2"  (1.88 m)   Wt 228 lb (103.4 kg)   BMI 29.27 kg/m  NAD. Alert, oriented. Lungs clear. Heart RRR. Abdomen soft, non distended with active BS x 4. Non tender. No obvious masses.  U/A dipstick shows trace blood. Micro exam neg.    Assessment:  . Problem List Items Addressed This Visit      Cardiovascular and Mediastinum   Essential hypertension, benign   Relevant Orders   Basic metabolic panel (Completed)    Other Visit Diagnoses    Lower abdominal pain    -  Primary   Relevant Orders   POCT urinalysis dipstick (Completed)   POCT UA - Microscopic Only (Completed)   CBC with Differential/Platelet (Completed)   Screening for prostate cancer       Relevant Orders   PSA (Completed)   Screening for lipid disorders       Relevant Orders   Lipid panel (Completed)   High risk medication use       Relevant Orders   Hepatic function panel (Completed)       Plan:  Routine labs ordered for physical next week. Testicular and prostate exams at that time. Warning signs reviewed. Go to ED over the weekend if worse.

## 2016-08-25 ENCOUNTER — Encounter: Payer: 59 | Admitting: Family Medicine

## 2016-08-26 ENCOUNTER — Ambulatory Visit (INDEPENDENT_AMBULATORY_CARE_PROVIDER_SITE_OTHER): Payer: 59 | Admitting: Family Medicine

## 2016-08-26 ENCOUNTER — Encounter: Payer: Self-pay | Admitting: Family Medicine

## 2016-08-26 VITALS — BP 130/90 | Ht 73.0 in | Wt 227.1 lb

## 2016-08-26 DIAGNOSIS — Z1211 Encounter for screening for malignant neoplasm of colon: Secondary | ICD-10-CM

## 2016-08-26 DIAGNOSIS — Z0000 Encounter for general adult medical examination without abnormal findings: Secondary | ICD-10-CM | POA: Diagnosis not present

## 2016-08-26 NOTE — Progress Notes (Signed)
Subjective:    Patient ID: Nicholas Sharp, male    DOB: 08/07/1965, 51 y.o.   MRN: [id number]  HPI  The patient comes in today for a wellness visit.    A review of their health history was completed.  A review of medications was also completed.  Any needed refills;  None   Eating habits: Patient states eating habits are good.   Falls/  MVA accidents in past few months:  None   Regular exercise:  Patient states tries to exercise at least 3 times per week  Specialist pt sees on regular basis: none   Preventative health issues were discussed.   Additional concerns: Patient has concerns of pain in lower abdomen. Had recent labs drawn last week. Saw Carolyn on 08/21/16  abd pain somewhat improved compared to last yr, had upper endoscopy yrs ago, never has had colonocopy  bm s normal     Results for orders placed or performed in visit on 08/21/16  Lipid panel  Result Value Ref Range   Cholesterol, Total 225 (H) 100 - 199 mg/dL   Triglycerides 409186 (H) 0 - 149 mg/dL   HDL 44 >81>39 mg/dL   VLDL Cholesterol Cal 37 5 - 40 mg/dL   LDL Calculated 191144 (H) 0 - 99 mg/dL   Chol/HDL Ratio 5.1 (H) 0.0 - 5.0 ratio  Hepatic function panel  Result Value Ref Range   Total Protein 6.9 6.0 - 8.5 g/dL   Albumin 4.6 3.5 - 5.5 g/dL   Bilirubin Total 0.4 0.0 - 1.2 mg/dL   Bilirubin, Direct 4.780.09 0.00 - 0.40 mg/dL   Alkaline Phosphatase 91 39 - 117 IU/L   AST 36 0 - 40 IU/L   ALT 44 0 - 44 IU/L  Basic metabolic panel  Result Value Ref Range   Glucose 92 65 - 99 mg/dL   BUN 19 6 - 24 mg/dL   Creatinine, Ser 2.950.75 (L) 0.76 - 1.27 mg/dL   GFR calc non Af Amer 107 >59 mL/min/1.73   GFR calc Af Amer 124 >59 mL/min/1.73   BUN/Creatinine Ratio 25 (H) 9 - 20   Sodium 141 134 - 144 mmol/L   Potassium 4.6 3.5 - 5.2 mmol/L   Chloride 101 96 - 106 mmol/L   CO2 25 18 - 29 mmol/L   Calcium 9.3 8.7 - 10.2 mg/dL  PSA  Result Value Ref Range   Prostate Specific Ag, Serum 0.8 0.0 - 4.0 ng/mL  CBC  with Differential/Platelet  Result Value Ref Range   WBC 4.9 3.4 - 10.8 x10E3/uL   RBC 5.35 4.14 - 5.80 x10E6/uL   Hemoglobin 15.8 13.0 - 17.7 g/dL   Hematocrit 62.145.8 30.837.5 - 51.0 %   MCV 86 79 - 97 fL   MCH 29.5 26.6 - 33.0 pg   MCHC 34.5 31.5 - 35.7 g/dL   RDW 65.713.8 84.612.3 - 96.215.4 %   Platelets 241 150 - 379 x10E3/uL   Neutrophils 59 Not Estab. %   Lymphs 29 Not Estab. %   Monocytes 10 Not Estab. %   Eos 2 Not Estab. %   Basos 0 Not Estab. %   Neutrophils Absolute 2.9 1.4 - 7.0 x10E3/uL   Lymphocytes Absolute 1.4 0.7 - 3.1 x10E3/uL   Monocytes Absolute 0.5 0.1 - 0.9 x10E3/uL   EOS (ABSOLUTE) 0.1 0.0 - 0.4 x10E3/uL   Basophils Absolute 0.0 0.0 - 0.2 x10E3/uL   Immature Granulocytes 0 Not Estab. %   Immature Grans (Abs) 0.0 0.0 -  0.1 x10E3/uL  POCT urinalysis dipstick  Result Value Ref Range   Color, UA     Clarity, UA     Glucose, UA     Bilirubin, UA     Ketones, UA     Spec Grav, UA >=1.030 (A) 1.010 - 1.025   Blood, UA trace    pH, UA 5.0 5.0 - 8.0   Protein, UA     Urobilinogen, UA  0.2 or 1.0 E.U./dL   Nitrite, UA     Leukocytes, UA  Negative  POCT UA - Microscopic Only  Result Value Ref Range   WBC, Ur, HPF, POC neg    RBC, urine, microscopic neg    Bacteria, U Microscopic neg    Mucus, UA     Epithelial cells, urine per micros rare    Crystals, Ur, HPF, POC     Casts, Ur, LPF, POC     Yeast, UA       Review of Systems  Constitutional: Negative for activity change, appetite change and fever.  HENT: Negative for congestion and rhinorrhea.   Eyes: Negative for discharge.  Respiratory: Negative for cough and wheezing.   Cardiovascular: Negative for chest pain.  Gastrointestinal: Negative for abdominal pain, blood in stool and vomiting.  Genitourinary: Negative for difficulty urinating and frequency.  Musculoskeletal: Negative for neck pain.  Skin: Negative for rash.  Allergic/Immunologic: Negative for environmental allergies and food allergies.    Neurological: Negative for weakness and headaches.  Psychiatric/Behavioral: Negative for agitation.  All other systems reviewed and are negative.      Objective:   Physical Exam  Constitutional: He appears well-developed and well-nourished.  HENT:  Head: Normocephalic and atraumatic.  Right Ear: External ear normal.  Left Ear: External ear normal.  Nose: Nose normal.  Mouth/Throat: Oropharynx is clear and moist.  Eyes: EOM are normal. Pupils are equal, round, and reactive to light.  Neck: Normal range of motion. Neck supple. No thyromegaly present.  Cardiovascular: Normal rate, regular rhythm and normal heart sounds.   No murmur heard. Pulmonary/Chest: Effort normal and breath sounds normal. No respiratory distress. He has no wheezes.  Abdominal: Soft. Bowel sounds are normal. He exhibits no distension and no mass. There is no tenderness.  Genitourinary: Penis normal.  Musculoskeletal: Normal range of motion. He exhibits no edema.  Lymphadenopathy:    He has no cervical adenopathy.  Neurological: He is alert. He exhibits normal muscle tone.  Skin: Skin is warm and dry. No erythema.  Psychiatric: He has a normal mood and affect. His behavior is normal. Judgment normal.  Vitals reviewed.         Assessment & Plan:  Impression 1 wellness exam 2 fatigue mild in nature #3 nonspecific low abdominal pain improving #4 hyperlipidemia discussed too early for medicines plan diet exercise discussed. GI referral

## 2016-08-27 ENCOUNTER — Encounter: Payer: Self-pay | Admitting: Family Medicine

## 2016-08-27 ENCOUNTER — Encounter (INDEPENDENT_AMBULATORY_CARE_PROVIDER_SITE_OTHER): Payer: Self-pay | Admitting: *Deleted

## 2016-09-03 ENCOUNTER — Encounter: Payer: Self-pay | Admitting: Family Medicine

## 2016-09-03 ENCOUNTER — Encounter: Payer: Self-pay | Admitting: Gastroenterology

## 2016-10-05 ENCOUNTER — Ambulatory Visit (INDEPENDENT_AMBULATORY_CARE_PROVIDER_SITE_OTHER): Payer: 59 | Admitting: Family Medicine

## 2016-10-05 ENCOUNTER — Encounter: Payer: Self-pay | Admitting: Family Medicine

## 2016-10-05 VITALS — BP 128/96 | Ht 73.0 in | Wt 226.0 lb

## 2016-10-05 DIAGNOSIS — F1010 Alcohol abuse, uncomplicated: Secondary | ICD-10-CM | POA: Diagnosis not present

## 2016-10-05 MED ORDER — ALPRAZOLAM 1 MG PO TABS: 1.0000 mg | ORAL_TABLET | Freq: Two times a day (BID) | ORAL | 1 refills | Status: DC | PRN

## 2016-10-05 NOTE — Progress Notes (Signed)
   Subjective:    Patient ID: Nicholas Sharp, male    DOB: 07/06/1965, 51 y.o.   MRN: [id number]  HPI: Pt is here today to discuss decreasing his  alcohol intake. States the problem has been occurring off and on for about four years. Pt states he drinks six to eighteen beers per day.   No other complaints.  Hx of alcohol use in the past  Recently hgher amnto f alcohol use  Due to boredom and other  Has high tolerance for alcohol  Messing with diinished social interactions and feels more prone to being alone  alcohl does not mess with work, bu on occasion pt does not stop when he should  Pt in the past, has done ananax for a couple months and it cut down urge, and overall gradually got off the sanax and doid well   Not feeling depressed   Used xnax primarily for withdrawal symotoms and windin down with out as much ned for meds  No other sub issues      Review of Systems No headache, no major weight loss or weight gain, no chest pain no back pain abdominal pain no change in bowel habits complete ROS otherwise negative     Objective:   Physical Exam Alert and oriented, vitals reviewed and stable, NAD ENT-TM's and ext canals WNL bilat via otoscopic exam Soft palate, tonsils and post pharynx WNL via oropharyngeal exam Neck-symmetric, no masses; thyroid nonpalpable and nontender Pulmonary-no tachypnea or accessory muscle use; Clear without wheezes via auscultation Card--no abnrml murmurs, rhythm reg and rate WNL Carotid pulses symmetric, without bruits        Assessment & Plan:  Impression alcohol abuse disorder. Claims no element of depression, some element of anxiety. Definite dependence on excess amount of alcohol. Starting to affect social obligations. Patient claims not affecting work. In the past he utilize Xanax to wean himself down off high use of alcohol. Prescription given May take 1 Xanax up to twice per day. Do not mix with alcohol. Drowsy precautions  discussed. Patient challenge every week or 2 to her right tried to reduce he now taking in.

## 2016-10-23 ENCOUNTER — Encounter: Payer: Self-pay | Admitting: Gastroenterology

## 2016-10-23 ENCOUNTER — Ambulatory Visit (AMBULATORY_SURGERY_CENTER): Payer: Self-pay

## 2016-10-23 VITALS — Ht 75.0 in | Wt 222.8 lb

## 2016-10-23 DIAGNOSIS — Z1211 Encounter for screening for malignant neoplasm of colon: Secondary | ICD-10-CM

## 2016-10-23 MED ORDER — NA SULFATE-K SULFATE-MG SULF 17.5-3.13-1.6 GM/177ML PO SOLN: 1.0000 | Freq: Once | ORAL | 0 refills | Status: AC

## 2016-10-23 NOTE — Progress Notes (Signed)
Denies allergies to eggs or soy products. Denies complication of anesthesia or sedation. Denies use of weight loss medication. Denies use of O2.   Emmi instructions given for colonoscopy.  

## 2016-10-28 HISTORY — PX: COLONOSCOPY: SHX174

## 2016-11-05 ENCOUNTER — Encounter: Payer: Self-pay | Admitting: Gastroenterology

## 2016-11-05 ENCOUNTER — Ambulatory Visit (AMBULATORY_SURGERY_CENTER): Payer: 59 | Admitting: Gastroenterology

## 2016-11-05 VITALS — BP 132/85 | HR 63 | Temp 98.6°F | Resp 11 | Ht 73.0 in | Wt 226.0 lb

## 2016-11-05 DIAGNOSIS — Z1211 Encounter for screening for malignant neoplasm of colon: Secondary | ICD-10-CM

## 2016-11-05 DIAGNOSIS — Z1212 Encounter for screening for malignant neoplasm of rectum: Secondary | ICD-10-CM

## 2016-11-05 MED ORDER — SODIUM CHLORIDE 0.9 % IV SOLN: 500.0000 mL | INTRAVENOUS | Status: DC

## 2016-11-05 NOTE — Patient Instructions (Signed)
Discharge instructions given. Handout on diverticulosis. Resume previous medications. YOU HAD AN ENDOSCOPIC PROCEDURE TODAY AT THE [facility]:   Refer to the procedure report that was given to you for any specific questions about what was found during the examination.  If the procedure report does not answer your questions, please call your gastroenterologist to clarify.  If you requested that your care partner not be given the details of your procedure findings, then the procedure report has been included in a sealed envelope for you to review at your convenience later.  YOU SHOULD EXPECT: Some feelings of bloating in the abdomen. Passage of more gas than usual.  Walking can help get rid of the air that was put into your GI tract during the procedure and reduce the bloating. If you had a lower endoscopy (such as a colonoscopy or flexible sigmoidoscopy) you may notice spotting of blood in your stool or on the toilet paper. If you underwent a bowel prep for your procedure, you may not have a normal bowel movement for a few days.  Please Note:  You might notice some irritation and congestion in your nose or some drainage.  This is from the oxygen used during your procedure.  There is no need for concern and it should clear up in a day or so.  SYMPTOMS TO REPORT IMMEDIATELY:   Following lower endoscopy (colonoscopy or flexible sigmoidoscopy):  Excessive amounts of blood in the stool  Significant tenderness or worsening of abdominal pains  Swelling of the abdomen that is new, acute  Fever of 100F or higher   For urgent or emergent issues, a gastroenterologist can be reached at any hour by calling [phone].   DIET:  We do recommend a small meal at first, but then you may proceed to your regular diet.  Drink plenty of fluids but you should avoid alcoholic beverages for 24 hours.  ACTIVITY:  You should plan to take it easy for the rest of today and you should NOT DRIVE or use  heavy machinery until tomorrow (because of the sedation medicines used during the test).    FOLLOW UP: Our staff will call the number listed on your records the next business day following your procedure to check on you and address any questions or concerns that you may have regarding the information given to you following your procedure. If we do not reach you, we will leave a message.  However, if you are feeling well and you are not experiencing any problems, there is no need to return our call.  We will assume that you have returned to your regular daily activities without incident.  If any biopsies were taken you will be contacted by phone or by letter within the next 1-3 weeks.  Please call us at [phone] if you have not heard about the biopsies in 3 weeks.    SIGNATURES/CONFIDENTIALITY: You and/or your care partner have signed paperwork which will be entered into your electronic medical record.  These signatures attest to the fact that that the information above on your After Visit Summary has been reviewed and is understood.  Full responsibility of the confidentiality of this discharge information lies with you and/or your care-partner. 

## 2016-11-05 NOTE — Progress Notes (Signed)
Report to PACU, RN, vss, BBS= Clear.  

## 2016-11-05 NOTE — Op Note (Signed)
[facility] Patient Name: Nicholas Sharp Procedure Date: 11/05/2016 9:47 AM MRN: [id number] Endoscopist: Sherilyn Cooter L. Myrtie Neither , MD Age: 51 Referring MD:  Date of Birth: 03/09/1966 Gender: Male Account #: [id number] Procedure:                Colonoscopy Indications:              Screening for colorectal malignant neoplasm, This                            is the patient's first colonoscopy Medicines:                Monitored Anesthesia Care Procedure:                Pre-Anesthesia Assessment:                           - Prior to the procedure, a History and Physical                            was performed, and patient medications and                            allergies were reviewed. The patient's tolerance of                            previous anesthesia was also reviewed. The risks                            and benefits of the procedure and the sedation                            options and risks were discussed with the patient.                            All questions were answered, and informed consent                            was obtained. Prior Anticoagulants: The patient has                            taken no previous anticoagulant or antiplatelet                            agents. ASA Grade Assessment: II - A patient with                            mild systemic disease. After reviewing the risks                            and benefits, the patient was deemed in                            satisfactory condition to undergo the procedure.  After obtaining informed consent, the colonoscope                            was passed under direct vision. Throughout the                            procedure, the patient's blood pressure, pulse, and                            oxygen saturations were monitored continuously. The                            Colonoscope was introduced through the anus and                            advanced to the the cecum,  identified by                            appendiceal orifice and ileocecal valve. The                            colonoscopy was performed without difficulty. The                            patient tolerated the procedure well. The quality                            of the bowel preparation was excellent. The                            ileocecal valve, appendiceal orifice, and rectum                            were photographed. The quality of the bowel                            preparation was evaluated using the BBPS [facility]                            Bowel Preparation Scale) with scores of: Right                            Colon = 3, Transverse Colon = 3 and Left Colon = 3                            (entire mucosa seen well with no residual staining,                            small fragments of stool or opaque liquid). The                            total BBPS score equals 9. The bowel preparation  used was SUPREP. Scope In: 9:57:48 AM Scope Out: 10:08:14 AM Scope Withdrawal Time: 0 hours 8 minutes 27 seconds  Total Procedure Duration: 0 hours 10 minutes 26 seconds  Findings:                 The perianal and digital rectal examinations were                            normal.                           Multiple diverticula were found in the left colon.                           The exam was otherwise without abnormality on                            direct and retroflexion views. Complications:            No immediate complications. Estimated Blood Loss:     Estimated blood loss: none. Impression:               - Diverticulosis in the left colon.                           - The examination was otherwise normal on direct                            and retroflexion views.                           - No specimens collected. Recommendation:           - Patient has a contact number available for                            emergencies. The signs and symptoms of  potential                            delayed complications were discussed with the                            patient. Return to normal activities tomorrow.                            Written discharge instructions were provided to the                            patient.                           - Resume previous diet.                           - Continue present medications.                           - Repeat colonoscopy in 10 years for screening  purposes. Henry L. Myrtie Neitheranis, MD 11/05/2016 10:15:07 AM This report has been signed electronically.

## 2016-11-05 NOTE — Progress Notes (Signed)
Pt's states no medical or surgical changes since previsit or office visit. maw 

## 2016-11-06 ENCOUNTER — Telehealth: Payer: Self-pay

## 2016-11-06 NOTE — Telephone Encounter (Signed)
  Follow up Call-  Call back number 11/05/2016  Post procedure Call Back phone  # #[phone] cell  Permission to leave phone message Yes  Some recent data might be hidden     Patient questions:  Do you have a fever, pain , or abdominal swelling? No. Pain Score  0 *  Have you tolerated food without any problems? Yes.    Have you been able to return to your normal activities? Yes.    Do you have any questions about your discharge instructions: Diet   No. Medications  No. Follow up visit  No.  Do you have questions or concerns about your Care? No.  Actions: * If pain score is 4 or above: No action needed, pain <4.

## 2017-03-19 ENCOUNTER — Encounter: Payer: Self-pay | Admitting: Family Medicine

## 2017-03-19 ENCOUNTER — Ambulatory Visit: Payer: 59 | Admitting: Family Medicine

## 2017-03-19 VITALS — BP 128/80 | Ht 73.0 in | Wt 230.0 lb

## 2017-03-19 DIAGNOSIS — I10 Essential (primary) hypertension: Secondary | ICD-10-CM | POA: Diagnosis not present

## 2017-03-19 DIAGNOSIS — M5440 Lumbago with sciatica, unspecified side: Secondary | ICD-10-CM | POA: Diagnosis not present

## 2017-03-19 DIAGNOSIS — E291 Testicular hypofunction: Secondary | ICD-10-CM

## 2017-03-19 NOTE — Progress Notes (Signed)
   Subjective:    Patient ID: Nicholas Sharp, male    DOB: 01/22/1966, 51 y.o.   MRN: [id number]  HPIFollow up from work physical. Pt states he had some things he wanted to discuss with the doctor to get his opinion.   Pt has lost a lot of muscle and body upper strength,,  Affecting ability to do his usual work  Hitting pt hard  Leg strength not as good as should   Exercise not as much as he used to, trying to exercise any soig amount  Has cut back on alcohol  Has worked on cutting down  Erectile function not 190 some test chked at the [facility]"   p patient notes his testosterone is borderline low.  See above.  Some diminished energy.  Some diminished muscle strength.      Review of Systems No headache, no major weight loss or weight gain, no chest pain no back pain abdominal pain no change in bowel habits complete ROS otherwise negative     Objective:   Physical Exam  Alert and oriented, vitals reviewed and stable, NAD ENT-TM's and ext canals WNL bilat via otoscopic exam Soft palate, tonsils and post pharynx WNL via oropharyngeal exam Neck-symmetric, no masses; thyroid nonpalpable and nontender Pulmonary-no tachypnea or accessory muscle use; Clear without wheezes via auscultation Card--no abnrml murmurs, rhythm reg and rate WNL Carotid pulses symmetric, without bruits       Assessment & Plan:  Impression borderline low testosterone.  Very long discussion held.  Numerous questions answered.  Plan to maintain same antidepressant.  Rationale for not prescribing testosterone discussed at great length, including increased risk of stroke heart attack clots pulmonary emboli prostate cancer etc. etc.  Greater than 50% of this 25 minute face to face visit was spent in counseling and discussion and coordination of care regarding the above diagnosis/diagnosies

## 2017-03-29 ENCOUNTER — Telehealth: Payer: Self-pay | Admitting: Family Medicine

## 2017-03-29 NOTE — Telephone Encounter (Signed)
Patient was given a name of a urologist that Dr. Brett CanalesSteve recommends when he was seen a couple of weeks ago, but he forgot.  He wants to know what was the name of the [place]rologist in [place] that Dr. Brett CanalesSteve had mentioned?

## 2017-03-31 NOTE — Telephone Encounter (Signed)
I called and left a message. Asked that he r/c.

## 2017-03-31 NOTE — Telephone Encounter (Signed)
Dr Annabell Howellswrenn and dr dalstedt [facility]

## 2017-04-01 NOTE — Telephone Encounter (Signed)
Pt is aware.  

## 2017-04-06 ENCOUNTER — Telehealth: Payer: Self-pay | Admitting: Family Medicine

## 2017-04-06 DIAGNOSIS — R7989 Other specified abnormal findings of blood chemistry: Secondary | ICD-10-CM

## 2017-04-06 NOTE — Telephone Encounter (Signed)
Referral put in.

## 2017-04-06 NOTE — Telephone Encounter (Signed)
Ok lets do 

## 2017-04-06 NOTE — Telephone Encounter (Signed)
Pt made appointment with Dr. Annabell HowellsWrenn (urology) for his low testosterone - Appointment is 04/26/17  Need to send OV notes & labs  Please initiate referral in system so that I may complete proper processing and legally print & send records

## 2017-04-22 ENCOUNTER — Other Ambulatory Visit: Payer: Self-pay | Admitting: *Deleted

## 2017-04-22 ENCOUNTER — Telehealth: Payer: Self-pay | Admitting: Family Medicine

## 2017-04-22 MED ORDER — DICLOFENAC SODIUM 75 MG PO TBEC: 75.0000 mg | DELAYED_RELEASE_TABLET | Freq: Two times a day (BID) | ORAL | 0 refills | Status: DC

## 2017-04-22 MED ORDER — CHLORZOXAZONE 500 MG PO TABS: 500.0000 mg | ORAL_TABLET | Freq: Three times a day (TID) | ORAL | 0 refills | Status: DC | PRN

## 2017-04-22 NOTE — Telephone Encounter (Signed)
Patient is requesting a muscle relaxer for lower back pain ,has appointment tomorrow stated couldn't come today but can barely sit,stand or get out of bed. Call into [facility]

## 2017-04-22 NOTE — Telephone Encounter (Signed)
I recommend voltaren 75 mg 1 twice daily, #30 also Parafon forte 1 3 times daily as needed muscle spasms, use only, 30, if ongoing troubles follow-up office visit, gentle stretching exercises

## 2017-04-22 NOTE — Telephone Encounter (Signed)
Patient states he has a burning stabbing pain in lower back. He states it started yesterday am ,when he was putting dishes away. He does not remember injury. Taking aleve,ibuprofen,patches, no help heating pad. Would like a antiinflammatory or a muscle relaxer. He states when he got up it took three to four minutes to stand up straight.

## 2017-04-22 NOTE — Telephone Encounter (Signed)
Discussed with pt. Pt verbalized understanding. meds sent to pharm.  

## 2017-04-23 ENCOUNTER — Encounter: Payer: Self-pay | Admitting: Family Medicine

## 2017-04-23 ENCOUNTER — Ambulatory Visit: Payer: 59 | Admitting: Family Medicine

## 2017-04-23 ENCOUNTER — Telehealth: Payer: Self-pay | Admitting: Family Medicine

## 2017-04-23 VITALS — BP 128/86 | Ht 73.0 in | Wt 242.0 lb

## 2017-04-23 DIAGNOSIS — M545 Low back pain, unspecified: Secondary | ICD-10-CM

## 2017-04-23 MED ORDER — OXYCODONE-ACETAMINOPHEN 5-325 MG PO TABS: ORAL_TABLET | ORAL | 0 refills | Status: DC

## 2017-04-23 MED ORDER — TIZANIDINE HCL 4 MG PO TABS: 4.0000 mg | ORAL_TABLET | Freq: Three times a day (TID) | ORAL | 1 refills | Status: DC | PRN

## 2017-04-23 MED ORDER — OXYCODONE-ACETAMINOPHEN 7.5-325 MG PO TABS: ORAL_TABLET | ORAL | 0 refills | Status: DC

## 2017-04-23 MED ORDER — CHLORZOXAZONE 500 MG PO TABS: 500.0000 mg | ORAL_TABLET | Freq: Three times a day (TID) | ORAL | 1 refills | Status: DC | PRN

## 2017-04-23 NOTE — Telephone Encounter (Signed)
Patient called Tuesday for muscle relaxer due to back pain and was prescribe voltaren 75 mg but its out of stock and he wants a different prescription called into  [facility].

## 2017-04-23 NOTE — Telephone Encounter (Signed)
Ok zanaflex 4 numb thiry one tid prn one ref

## 2017-04-23 NOTE — Progress Notes (Signed)
   Subjective:    Patient ID: Nicholas Sharp, male    DOB: 05/27/1965, 52 y.o.   MRN: [id number]  Back Pain  This is a new problem. Episode onset: 2 days ago. The pain is present in the lumbar spine. The symptoms are aggravated by twisting (walking, standing up straight). Treatments tried: muscle relaxer and anti inflammatory, oxycodone.    Reaching forward had excrutiatin pain, very extreme  Hit on tuesd  Wed norn   Right lower lumbar pain, once immoveable ok. transiti0n severe and painful , having hard tine stretchoing out complete  No sig leg extension   Some burning long term a bit irritatd, but not severe  Loaded wheels and tires in the truck , and lifted heavy   Pain soewhat better thsi morn           Review of Systems  Musculoskeletal: Positive for back pain.       Objective:   Physical Exam  Alert vitals stable, NAD. Blood pressure good on repeat. HEENT normal. Lungs clear. Heart regular rate and rhythm. Diffuse right lumbar tenderness negative straight leg raise      Assessment & Plan:  Impression lumbar strain local measures discussed.  Anti-inflammatory medicine prescribed symptom care discussed

## 2017-04-23 NOTE — Telephone Encounter (Signed)
Parafon forte out of stock and wants another muscle relaxer

## 2017-04-23 NOTE — Telephone Encounter (Signed)
Prescription sent electronically to pharmacy. Patient notified. 

## 2017-04-27 DIAGNOSIS — E291 Testicular hypofunction: Secondary | ICD-10-CM | POA: Diagnosis not present

## 2017-05-27 DIAGNOSIS — E291 Testicular hypofunction: Secondary | ICD-10-CM | POA: Diagnosis not present

## 2017-06-18 DIAGNOSIS — E291 Testicular hypofunction: Secondary | ICD-10-CM | POA: Diagnosis not present

## 2017-07-06 ENCOUNTER — Telehealth: Payer: Self-pay | Admitting: Family Medicine

## 2017-07-06 ENCOUNTER — Other Ambulatory Visit: Payer: Self-pay | Admitting: Family Medicine

## 2017-07-06 MED ORDER — DICLOFENAC SODIUM 75 MG PO TBEC: 75.0000 mg | DELAYED_RELEASE_TABLET | Freq: Two times a day (BID) | ORAL | 3 refills | Status: DC

## 2017-07-06 NOTE — Telephone Encounter (Signed)
Ok three ref 

## 2017-07-06 NOTE — Telephone Encounter (Signed)
I responnded to this thru phone message

## 2017-07-06 NOTE — Telephone Encounter (Signed)
Pt is requesting a refill on diclofenac (VOLTAREN) 75 MG EC tablet    [facility] [place]

## 2017-07-19 ENCOUNTER — Telehealth: Payer: Self-pay | Admitting: Family Medicine

## 2017-07-19 MED ORDER — ALPRAZOLAM 0.5 MG PO TABS: 0.5000 mg | ORAL_TABLET | Freq: Three times a day (TID) | ORAL | 0 refills | Status: DC | PRN

## 2017-07-19 NOTE — Telephone Encounter (Signed)
I spoke with the pt he states his father is in Hospice and has 24- 72 hours to live. He states we had given him the Alprazolam in the past when his mother was passing away,and he thinks he needs this to cope he states he is not having any suicidal thought nor does he want to harm himself.

## 2017-07-19 NOTE — Telephone Encounter (Signed)
Xanax 0.5 mg numb 30 one tid prn anxiety drowsy prc

## 2017-07-19 NOTE — Telephone Encounter (Signed)
Patient is aware. [facility] [address]

## 2017-07-19 NOTE — Telephone Encounter (Signed)
Patient is requesting an Rx for Alprazolam.  He said that he is having a hard time dealing with his father's short time to live.  [facility]

## 2017-08-10 ENCOUNTER — Encounter: Payer: Self-pay | Admitting: Family Medicine

## 2017-08-10 ENCOUNTER — Ambulatory Visit: Payer: 59 | Admitting: Family Medicine

## 2017-08-10 VITALS — BP 120/88 | Temp 98.2°F | Ht 73.0 in | Wt 237.0 lb

## 2017-08-10 DIAGNOSIS — L559 Sunburn, unspecified: Secondary | ICD-10-CM | POA: Diagnosis not present

## 2017-08-10 DIAGNOSIS — S30861A Insect bite (nonvenomous) of abdominal wall, initial encounter: Secondary | ICD-10-CM

## 2017-08-10 DIAGNOSIS — W57XXXA Bitten or stung by nonvenomous insect and other nonvenomous arthropods, initial encounter: Secondary | ICD-10-CM

## 2017-08-10 MED ORDER — PREDNISONE 20 MG PO TABS: ORAL_TABLET | ORAL | 0 refills | Status: DC

## 2017-08-10 NOTE — Progress Notes (Signed)
   Subjective:    Patient ID: Nicholas Sharp, male    DOB: April 20, 1965, 52 y.o.   MRN: [id number]  HPI  Patient is here today with complaints of feeling sluggish,achey around eye and hands feeling and funny almost flu like after finding four ticks on him that had bitten him with in the last 2-3 wks.  Got tick bites multiple   has had several tick bites.  Small erythematous papules inside of bites.  Has had no headache.  No true fever.  No true rash.  Discussed the feeling very achy today.  Of note patient received major sunburn on his trunk arms and legs over the weekend.  This substantial malaise him in the last 24 hours  Review of Systems No headache, no major weight loss or weight gain, no chest pain no back pain abdominal pain no change in bowel habits complete ROS otherwise negative     Objective:   Physical Exam  Alert vitals stable, NAD. Blood pressure good on repeat. HEENT normal. Lungs clear. Heart regular rate and rhythm. Impressive diffuse sunburn trunk and extremities.  Small erythematous papules tick bite site.  No rashes      Assessment & Plan:  Impression systemic features secondary to diffuse sunburn.  Discussed.  Prednisone initiated.  Symptom care discussed.  Warning signs regarding tick bites discussed

## 2017-08-25 ENCOUNTER — Encounter: Payer: Self-pay | Admitting: Nurse Practitioner

## 2017-08-25 ENCOUNTER — Ambulatory Visit: Payer: 59 | Admitting: Nurse Practitioner

## 2017-08-25 VITALS — BP 122/76 | Temp 98.0°F | Ht 73.0 in | Wt 232.0 lb

## 2017-08-25 DIAGNOSIS — Z23 Encounter for immunization: Secondary | ICD-10-CM | POA: Diagnosis not present

## 2017-08-25 DIAGNOSIS — S41111A Laceration without foreign body of right upper arm, initial encounter: Secondary | ICD-10-CM | POA: Diagnosis not present

## 2017-08-25 DIAGNOSIS — S30861D Insect bite (nonvenomous) of abdominal wall, subsequent encounter: Secondary | ICD-10-CM

## 2017-08-25 DIAGNOSIS — M79601 Pain in right arm: Secondary | ICD-10-CM

## 2017-08-25 DIAGNOSIS — W57XXXD Bitten or stung by nonvenomous insect and other nonvenomous arthropods, subsequent encounter: Secondary | ICD-10-CM

## 2017-08-25 MED ORDER — DOXYCYCLINE HYCLATE 100 MG PO TABS: 100.0000 mg | ORAL_TABLET | Freq: Two times a day (BID) | ORAL | 0 refills | Status: DC

## 2017-08-26 ENCOUNTER — Encounter: Payer: Self-pay | Admitting: Nurse Practitioner

## 2017-08-26 NOTE — Progress Notes (Signed)
Subjective: Presents for complaints of a scratch on his right lower arm from a rusty nail that occurred last week.  Healing well.  No tenderness.  Tried to get a tetanus shot through his employer but was unable to do so.  Is here today to update that.  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  Also began feeling tired today.  States he had to lay down for a while.  Feels exhausted.  Slight dizziness but no fainting spells.  No fever sore throat or headache.  Has had some joint pain.  No rashes.  Minimal cough.  Was seen on 08/10/2017 for a severe sunburn.  Was given prednisone.  This has resolved.  At that time mentioned that he has had four known tick bites over the past few weeks.  At that time no overt symptoms of tick disease.  Objective:   BP 122/76   Temp 98 F (36.7 C) (Oral)   Ht  (1.854 m)   Wt 232 lb (105.2 kg)   BMI 30.61 kg/m  NAD.  Alert, oriented.  TMs mild clear effusion, no erythema.  Pharynx clear.  Neck supple with mild soft anterior adenopathy.  Lungs clear.  Heart regular rate and rhythm.  Approximately 6 cm healing laceration noted on the right forearm.  Minimally pink around the edges, nontender.  No evidence of infection.  Assessment:  Arm laceration, right, initial encounter - Plan: Tdap vaccine greater than or equal to 7yo IM  Tick bite, subsequent encounter - Plan: Tdap vaccine greater than or equal to 7yo IM  Need for vaccination - Plan: Tdap vaccine greater than or equal to 7yo IM    Plan:   Meds ordered this encounter  Medications  . doxycycline (VIBRA-TABS) 100 MG tablet    Sig: Take 1 tablet (100 mg total) by mouth 2 (two) times daily.    Dispense:  20 tablet    Refill:  0    Order Specific Question:   Supervising Provider    Answer:   Merlyn Albert [[id number]]   Tdap vaccine today.  Since this is his second episode of malaise this month, will start doxycycline to cover tick disease as well as possible secondary infection  of his laceration.  Warning signs reviewed regarding tick disease and tetanus.  Patient to call back next week if no improvement, sooner if any problems.

## 2017-09-02 DIAGNOSIS — E291 Testicular hypofunction: Secondary | ICD-10-CM | POA: Diagnosis not present

## 2017-10-19 DIAGNOSIS — E291 Testicular hypofunction: Secondary | ICD-10-CM | POA: Diagnosis not present

## 2017-11-17 DIAGNOSIS — E291 Testicular hypofunction: Secondary | ICD-10-CM | POA: Diagnosis not present

## 2018-01-21 DIAGNOSIS — R3121 Asymptomatic microscopic hematuria: Secondary | ICD-10-CM | POA: Diagnosis not present

## 2018-01-21 DIAGNOSIS — E291 Testicular hypofunction: Secondary | ICD-10-CM | POA: Diagnosis not present

## 2018-01-26 DIAGNOSIS — R3121 Asymptomatic microscopic hematuria: Secondary | ICD-10-CM | POA: Diagnosis not present

## 2018-01-26 DIAGNOSIS — N200 Calculus of kidney: Secondary | ICD-10-CM | POA: Diagnosis not present

## 2018-02-02 DIAGNOSIS — R3121 Asymptomatic microscopic hematuria: Secondary | ICD-10-CM | POA: Diagnosis not present

## 2018-02-26 DIAGNOSIS — J069 Acute upper respiratory infection, unspecified: Secondary | ICD-10-CM | POA: Diagnosis not present

## 2018-04-09 ENCOUNTER — Other Ambulatory Visit: Payer: Self-pay | Admitting: Family Medicine

## 2018-04-11 ENCOUNTER — Ambulatory Visit: Payer: 59 | Admitting: Family Medicine

## 2018-04-11 ENCOUNTER — Encounter: Payer: Self-pay | Admitting: Family Medicine

## 2018-04-11 VITALS — BP 138/90 | Wt 238.0 lb

## 2018-04-11 DIAGNOSIS — R0682 Tachypnea, not elsewhere classified: Secondary | ICD-10-CM | POA: Diagnosis not present

## 2018-04-11 DIAGNOSIS — I10 Essential (primary) hypertension: Secondary | ICD-10-CM | POA: Diagnosis not present

## 2018-04-11 MED ORDER — ALPRAZOLAM 0.5 MG PO TABS: 0.5000 mg | ORAL_TABLET | Freq: Three times a day (TID) | ORAL | 3 refills | Status: DC | PRN

## 2018-04-11 NOTE — Progress Notes (Signed)
   Subjective:    Patient ID: Nicholas Sharp, male    DOB: 1966/03/11, 53 y.o.   MRN: [id number]  HPI Pt here today due to having trouble breathing at night. Pt states this has just recently started to happen. Pt states he has slept with head elevated but that has not helped   . Pt states he went to [facility] in [place] on Saturday when the episode happened. Pt states he was given an antihistamine  . Pt states that he did take a Xanax that he had and that has helped a lot.   Pt had sudden epsiode of trouble breathing while in bed, jmped up and could not breathe   Pt notes sob at tiems with exertion  Notes rrouble breahing with exertion on occsion  Pt was feeling anxious wen he woke up, could not get relief form going outside or taking a shower, called the n call nurse, had difficulty calmong his self down, had never experienced that     Pt was worried  Went to [employer]   When layed back down felt like he was going to fewK OUT    95 OXYG SAT, EKG AND ETELEMETRY ALL GOOD   HAD NO chestxray   Pt on testosterone thru the urologist  Was told to donate blood  o hx of smoking except transietnly   Sleep hygeine overall good, gets up nightly to urinate   fri night drank excessively and ate a lot of oysters          Review of Systems No headache, no major weight loss or weight gain, no chest pain no back pain abdominal pain no change in bowel habits complete ROS otherwise negative     Objective:   Physical Exam  Alert and oriented, vitals reviewed and stable, NAD ENT-TM's and ext canals WNL bilat via otoscopic exam Soft palate, tonsils and post pharynx WNL via oropharyngeal exam Neck-symmetric, no masses; thyroid nonpalpable and nontender Pulmonary-no tachypnea or accessory muscle use; Clear without wheezes via auscultation Card--no abnrml murmurs, rhythm reg and rate WNL Carotid pulses symmetric, without bruits       Assessment & Plan:    Impression 1 Probable/panic attack/sleep disorder/sudden tachypnea and inability to breathe.  Highly doubt physiological underlying diagnosis discussed.  Also patient reports some exercise intolerance.  Reports increased stress lately which will make him more likely to have nocturnal hypnagogic events.  Encouraged stress reduction.  Regular exercise.  May use Xanax as needed for sleep and/or her symptomatology.  Patient does not meet full pulmonary work-up rationale discussed  Greater than 50% of this 25 minute face to face visit was spent in counseling and discussion and coordination of care regarding the above diagnosis/diagnosies

## 2018-04-11 NOTE — Telephone Encounter (Signed)
Ok plus two ref 

## 2018-04-11 NOTE — Telephone Encounter (Signed)
Pt in today for office visit. This has been taking care of. Provider gave 3 refills instead of 2.

## 2018-04-20 DIAGNOSIS — E291 Testicular hypofunction: Secondary | ICD-10-CM | POA: Diagnosis not present

## 2018-04-25 DIAGNOSIS — E291 Testicular hypofunction: Secondary | ICD-10-CM | POA: Diagnosis not present

## 2018-04-25 DIAGNOSIS — R3121 Asymptomatic microscopic hematuria: Secondary | ICD-10-CM | POA: Diagnosis not present

## 2018-04-25 DIAGNOSIS — D751 Secondary polycythemia: Secondary | ICD-10-CM | POA: Diagnosis not present

## 2018-04-26 ENCOUNTER — Telehealth: Payer: Self-pay | Admitting: Family Medicine

## 2018-04-26 DIAGNOSIS — Z13220 Encounter for screening for lipoid disorders: Secondary | ICD-10-CM

## 2018-04-26 DIAGNOSIS — Z125 Encounter for screening for malignant neoplasm of prostate: Secondary | ICD-10-CM

## 2018-04-26 DIAGNOSIS — Z79899 Other long term (current) drug therapy: Secondary | ICD-10-CM

## 2018-04-26 DIAGNOSIS — I10 Essential (primary) hypertension: Secondary | ICD-10-CM

## 2018-04-26 NOTE — Telephone Encounter (Signed)
Repeat same 

## 2018-04-26 NOTE — Telephone Encounter (Signed)
Patient last had labs drawn 08/21/2016:cbc,lip,hep,bmet,psa.

## 2018-04-26 NOTE — Telephone Encounter (Signed)
Patient is requesting full work up of labs, his last physical was 08/26/16 .He wasn't requesting a physical he just wants labs done and than wanting to sit down and discuss them with you.

## 2018-04-27 ENCOUNTER — Other Ambulatory Visit: Payer: Self-pay | Admitting: Family Medicine

## 2018-04-27 NOTE — Addendum Note (Signed)
Addended by: Meredith Leeds on: 04/27/2018 08:36 AM   Modules accepted: Orders

## 2018-04-27 NOTE — Telephone Encounter (Signed)
I called and left a message to r/c. 

## 2018-05-02 DIAGNOSIS — I10 Essential (primary) hypertension: Secondary | ICD-10-CM | POA: Diagnosis not present

## 2018-05-02 DIAGNOSIS — Z79899 Other long term (current) drug therapy: Secondary | ICD-10-CM | POA: Diagnosis not present

## 2018-05-02 DIAGNOSIS — Z13220 Encounter for screening for lipoid disorders: Secondary | ICD-10-CM | POA: Diagnosis not present

## 2018-05-03 LAB — HEPATIC FUNCTION PANEL
ALT: 53 IU/L — AB (ref 0–44)
AST: 41 IU/L — AB (ref 0–40)
Albumin: 4.4 g/dL (ref 3.8–4.9)
Alkaline Phosphatase: 94 IU/L (ref 39–117)
BILIRUBIN TOTAL: 0.3 mg/dL (ref 0.0–1.2)
BILIRUBIN, DIRECT: 0.11 mg/dL (ref 0.00–0.40)
Total Protein: 7 g/dL (ref 6.0–8.5)

## 2018-05-03 LAB — LIPID PANEL
CHOLESTEROL TOTAL: 225 mg/dL — AB (ref 100–199)
Chol/HDL Ratio: 5.8 ratio — ABNORMAL HIGH (ref 0.0–5.0)
HDL: 39 mg/dL — ABNORMAL LOW (ref >39)
LDL Calculated: 119 mg/dL — ABNORMAL HIGH (ref 0–99)
Triglycerides: 334 mg/dL — ABNORMAL HIGH (ref 0–149)
VLDL Cholesterol Cal: 67 mg/dL — ABNORMAL HIGH (ref 5–40)

## 2018-05-03 LAB — CBC WITH DIFFERENTIAL/PLATELET
BASOS: 1 %
Basophils Absolute: 0 10*3/uL (ref 0.0–0.2)
EOS (ABSOLUTE): 0.1 10*3/uL (ref 0.0–0.4)
EOS: 2 %
HEMATOCRIT: 50.2 % (ref 37.5–51.0)
Hemoglobin: 17.1 g/dL (ref 13.0–17.7)
IMMATURE GRANS (ABS): 0 10*3/uL (ref 0.0–0.1)
IMMATURE GRANULOCYTES: 0 %
Lymphocytes Absolute: 1.7 10*3/uL (ref 0.7–3.1)
Lymphs: 27 %
MCH: 29 pg (ref 26.6–33.0)
MCHC: 34.1 g/dL (ref 31.5–35.7)
MCV: 85 fL (ref 79–97)
MONOS ABS: 0.7 10*3/uL (ref 0.1–0.9)
Monocytes: 10 %
NEUTROS ABS: 3.8 10*3/uL (ref 1.4–7.0)
Neutrophils: 60 %
PLATELETS: 265 10*3/uL (ref 150–450)
RBC: 5.89 x10E6/uL — ABNORMAL HIGH (ref 4.14–5.80)
RDW: 13.6 % (ref 11.6–15.4)
WBC: 6.3 10*3/uL (ref 3.4–10.8)

## 2018-05-03 LAB — BASIC METABOLIC PANEL
BUN/Creatinine Ratio: 30 — ABNORMAL HIGH (ref 9–20)
BUN: 24 mg/dL (ref 6–24)
CO2: 16 mmol/L — AB (ref 20–29)
CREATININE: 0.79 mg/dL (ref 0.76–1.27)
Calcium: 9 mg/dL (ref 8.7–10.2)
Chloride: 103 mmol/L (ref 96–106)
GFR calc Af Amer: 119 mL/min/{1.73_m2} (ref >59)
GFR, EST NON AFRICAN AMERICAN: 103 mL/min/{1.73_m2} (ref >59)
GLUCOSE: 84 mg/dL (ref 65–99)
Potassium: 4.2 mmol/L (ref 3.5–5.2)
Sodium: 139 mmol/L (ref 134–144)

## 2018-05-03 LAB — PSA: PROSTATE SPECIFIC AG, SERUM: 1 ng/mL (ref 0.0–4.0)

## 2018-05-04 DIAGNOSIS — R3121 Asymptomatic microscopic hematuria: Secondary | ICD-10-CM | POA: Diagnosis not present

## 2018-05-05 ENCOUNTER — Telehealth: Payer: Self-pay | Admitting: Family Medicine

## 2018-05-05 MED ORDER — HYDROXYZINE HCL 50 MG PO TABS: ORAL_TABLET | ORAL | 0 refills | Status: DC

## 2018-05-05 NOTE — Telephone Encounter (Signed)
Patient requesting refill for hydroxyzine 50mg , pt was prescribed this medication through urgent care and he states he tolerates medication very well, pt contacted urgent care for refill but could not get refill due to them being an urgent care, he was instructed by urgent care to contact pcp to request refill. Advise.   [facility] [place], [place]

## 2018-05-05 NOTE — Telephone Encounter (Signed)
Last seen 04/11/18. Has appt with follow up next week. Pt states he does not take every night but when needs it he needs it. Completely out. Would like to see if dr Lorin Picket would prescribe since dr Brett Canales out of the office. States when he needs it he usually has to take one around 8pm and then another around 11pm to go to sleep.   [facility] [place]

## 2018-05-05 NOTE — Telephone Encounter (Signed)
Hydroxyzine 50 mg 1 twice daily as needed caution drowsiness, #40, no refills, please have patient discuss further with Dr. Brett Canales when he follows up

## 2018-05-05 NOTE — Telephone Encounter (Signed)
Patient is aware 

## 2018-05-10 ENCOUNTER — Ambulatory Visit: Payer: 59 | Admitting: Family Medicine

## 2018-05-10 VITALS — BP 118/80 | Ht 73.0 in | Wt 241.0 lb

## 2018-05-10 DIAGNOSIS — R0682 Tachypnea, not elsewhere classified: Secondary | ICD-10-CM | POA: Diagnosis not present

## 2018-05-10 DIAGNOSIS — I10 Essential (primary) hypertension: Secondary | ICD-10-CM

## 2018-05-10 DIAGNOSIS — R7989 Other specified abnormal findings of blood chemistry: Secondary | ICD-10-CM | POA: Diagnosis not present

## 2018-05-10 NOTE — Patient Instructions (Signed)
Results for orders placed or performed in visit on 04/26/18  CBC with Differential/Platelet  Result Value Ref Range   WBC 6.3 3.4 - 10.8 x10E3/uL   RBC 5.89 (H) 4.14 - 5.80 x10E6/uL   Hemoglobin 17.1 13.0 - 17.7 g/dL   Hematocrit 45.8 09.9 - 51.0 %   MCV 85 79 - 97 fL   MCH 29.0 26.6 - 33.0 pg   MCHC 34.1 31.5 - 35.7 g/dL   RDW 83.3 82.5 - 05.3 %   Platelets 265 150 - 450 x10E3/uL   Neutrophils 60 Not Estab. %   Lymphs 27 Not Estab. %   Monocytes 10 Not Estab. %   Eos 2 Not Estab. %   Basos 1 Not Estab. %   Neutrophils Absolute 3.8 1.4 - 7.0 x10E3/uL   Lymphocytes Absolute 1.7 0.7 - 3.1 x10E3/uL   Monocytes Absolute 0.7 0.1 - 0.9 x10E3/uL   EOS (ABSOLUTE) 0.1 0.0 - 0.4 x10E3/uL   Basophils Absolute 0.0 0.0 - 0.2 x10E3/uL   Immature Granulocytes 0 Not Estab. %   Immature Grans (Abs) 0.0 0.0 - 0.1 x10E3/uL  Hepatic function panel  Result Value Ref Range   Total Protein 7.0 6.0 - 8.5 g/dL   Albumin 4.4 3.8 - 4.9 g/dL   Bilirubin Total 0.3 0.0 - 1.2 mg/dL   Bilirubin, Direct 9.76 0.00 - 0.40 mg/dL   Alkaline Phosphatase 94 39 - 117 IU/L   AST 41 (H) 0 - 40 IU/L   ALT 53 (H) 0 - 44 IU/L  Lipid panel  Result Value Ref Range   Cholesterol, Total 225 (H) 100 - 199 mg/dL   Triglycerides 734 (H) 0 - 149 mg/dL   HDL 39 (L) >19 mg/dL   VLDL Cholesterol Cal 67 (H) 5 - 40 mg/dL   LDL Calculated 379 (H) 0 - 99 mg/dL   Chol/HDL Ratio 5.8 (H) 0.0 - 5.0 ratio  Basic metabolic panel  Result Value Ref Range   Glucose 84 65 - 99 mg/dL   BUN 24 6 - 24 mg/dL   Creatinine, Ser 0.24 0.76 - 1.27 mg/dL   GFR calc non Af Amer 103 >59 mL/min/1.73   GFR calc Af Amer 119 >59 mL/min/1.73   BUN/Creatinine Ratio 30 (H) 9 - 20   Sodium 139 134 - 144 mmol/L   Potassium 4.2 3.5 - 5.2 mmol/L   Chloride 103 96 - 106 mmol/L   CO2 16 (L) 20 - 29 mmol/L   Calcium 9.0 8.7 - 10.2 mg/dL  PSA  Result Value Ref Range   Prostate Specific Ag, Serum 1.0 0.0 - 4.0 ng/mL

## 2018-05-10 NOTE — Progress Notes (Signed)
Subjective:  Patient arrives with numerous concerns  Patient ID: Nicholas Sharp, male    DOB: 04/14/1965, 53 y.o.   MRN: [id number]  HPIPt arrives to discuss bloodwork results.   Having numbness in right fingers for a couple of weeks.   Notes numbness in finger and thumb of right hand, feels cold at times, and gets twitching at times, but hard to rid of  No night symtoms   No change of color, but gets painfully num at times    Results for orders placed or performed in visit on 04/26/18  CBC with Differential/Platelet  Result Value Ref Range   WBC 6.3 3.4 - 10.8 x10E3/uL   RBC 5.89 (H) 4.14 - 5.80 x10E6/uL   Hemoglobin 17.1 13.0 - 17.7 g/dL   Hematocrit 40.950.2 81.137.5 - 51.0 %   MCV 85 79 - 97 fL   MCH 29.0 26.6 - 33.0 pg   MCHC 34.1 31.5 - 35.7 g/dL   RDW 91.413.6 78.211.6 - 95.615.4 %   Platelets 265 150 - 450 x10E3/uL   Neutrophils 60 Not Estab. %   Lymphs 27 Not Estab. %   Monocytes 10 Not Estab. %   Eos 2 Not Estab. %   Basos 1 Not Estab. %   Neutrophils Absolute 3.8 1.4 - 7.0 x10E3/uL   Lymphocytes Absolute 1.7 0.7 - 3.1 x10E3/uL   Monocytes Absolute 0.7 0.1 - 0.9 x10E3/uL   EOS (ABSOLUTE) 0.1 0.0 - 0.4 x10E3/uL   Basophils Absolute 0.0 0.0 - 0.2 x10E3/uL   Immature Granulocytes 0 Not Estab. %   Immature Grans (Abs) 0.0 0.0 - 0.1 x10E3/uL  Hepatic function panel  Result Value Ref Range   Total Protein 7.0 6.0 - 8.5 g/dL   Albumin 4.4 3.8 - 4.9 g/dL   Bilirubin Total 0.3 0.0 - 1.2 mg/dL   Bilirubin, Direct 2.130.11 0.00 - 0.40 mg/dL   Alkaline Phosphatase 94 39 - 117 IU/L   AST 41 (H) 0 - 40 IU/L   ALT 53 (H) 0 - 44 IU/L  Lipid panel  Result Value Ref Range   Cholesterol, Total 225 (H) 100 - 199 mg/dL   Triglycerides 086334 (H) 0 - 149 mg/dL   HDL 39 (L) >57>39 mg/dL   VLDL Cholesterol Cal 67 (H) 5 - 40 mg/dL   LDL Calculated 846119 (H) 0 - 99 mg/dL   Chol/HDL Ratio 5.8 (H) 0.0 - 5.0 ratio  Basic metabolic panel  Result Value Ref Range   Glucose 84 65 - 99 mg/dL   BUN 24 6 - 24  mg/dL   Creatinine, Ser 9.620.79 0.76 - 1.27 mg/dL   GFR calc non Af Amer 103 >59 mL/min/1.73   GFR calc Af Amer 119 >59 mL/min/1.73   BUN/Creatinine Ratio 30 (H) 9 - 20   Sodium 139 134 - 144 mmol/L   Potassium 4.2 3.5 - 5.2 mmol/L   Chloride 103 96 - 106 mmol/L   CO2 16 (L) 20 - 29 mmol/L   Calcium 9.0 8.7 - 10.2 mg/dL  PSA  Result Value Ref Range   Prostate Specific Ag, Serum 1.0 0.0 - 4.0 ng/mL   Pt still on testosterone, had hgb rising, hd decreases it to bi weekly.  States the reason he remains on testosterone is to help his muscle strength.  Fingers right minimum been getting numb and tingly at times.  Worried about this in terms of testosterone use.  On further history worse when exposed to cold.  Predict particularly middle finger.  No nocturnal symptoms.  No exertional symptoms   Anxiety issue has worsened, pt still having spells with that, but has cut back on beer of late, and hoping to control that in the future to just prn social setting;   Pt donating blood q two to three mo to help with elev hgb  eercise not the best, has sig strss, but its going to get better per pt  Review of Systems No headache, no major weight loss or weight gain, no chest pain no back pain abdominal pain no change in bowel habits complete ROS otherwise negative     Objective:   Physical Exam  Alert and oriented, vitals reviewed and stable, NAD ENT-TM's and ext canals WNL bilat via otoscopic exam Soft palate, tonsils and post pharynx WNL via oropharyngeal exam Neck-symmetric, no masses; thyroid nonpalpable and nontender Pulmonary-no tachypnea or accessory muscle use; Clear without wheezes via auscultation Card--no abnrml murmurs, rhythm reg and rate WNL Carotid pulses symmetric, without bruits       Assessment & Plan:  Impression 1 hyperlipidemia discussed diet discussed  2.  History of borderline elevated hemoglobin.  Felt in part due to testosterone.  Patient donating blood and has cut  down iron intake  3.  Right distal hand cold sensitivity on further history likely secondary to old injury.  4.  Continued anxiety spells.  See prior note.  Xanax helps some.  Patient has cut back down alcohol in hopes of helping also.  Follow-up as scheduled diet exercise discussed

## 2018-05-16 ENCOUNTER — Other Ambulatory Visit: Payer: Self-pay | Admitting: Family Medicine

## 2018-05-16 NOTE — Telephone Encounter (Signed)
Medication pended.

## 2018-05-16 NOTE — Telephone Encounter (Signed)
Pt would like to have refill on cough syrup sent to [facility] [phone] - [place], [place] - [address] AT [facility] [address]

## 2018-05-16 NOTE — Telephone Encounter (Signed)
Ok times one, let me know when penfed

## 2018-05-17 ENCOUNTER — Telehealth: Payer: Self-pay | Admitting: Family Medicine

## 2018-05-17 MED ORDER — HYDROCODONE-HOMATROPINE 5-1.5 MG/5ML PO SYRP: 5.0000 mL | ORAL_SOLUTION | Freq: Every evening | ORAL | 0 refills | Status: DC | PRN

## 2018-05-17 NOTE — Telephone Encounter (Signed)
Patient checking on  of medication.  Patient would like medication sent to [facility] [address] [place], [place]

## 2018-05-17 NOTE — Telephone Encounter (Signed)
done

## 2018-05-17 NOTE — Telephone Encounter (Signed)
Pt aware  HYDROcodone-homatropine (HYCODAN) 5-1.5 MG/5ML syrup [[id number]]  Has been sent in.

## 2018-06-10 ENCOUNTER — Other Ambulatory Visit: Payer: Self-pay | Admitting: Family Medicine

## 2018-06-10 ENCOUNTER — Telehealth: Payer: Self-pay | Admitting: Family Medicine

## 2018-06-10 MED ORDER — ALPRAZOLAM 1 MG PO TABS: ORAL_TABLET | ORAL | 3 refills | Status: DC

## 2018-06-10 NOTE — Telephone Encounter (Signed)
Pt returned call. Pt states he is taking one half tablet at about 5 pm and one half tablet at bedtime. Pt states he is waking up in the middle of the night and is unable to go back to sleep, pacing the floors and not being able to sleep. Pt states he has a lot of stress at the moment. Please advise. Thank you

## 2018-06-10 NOTE — Telephone Encounter (Signed)
Left message to return call. Need more information.

## 2018-06-10 NOTE — Telephone Encounter (Signed)
incr to 1.0 mg, same instruc

## 2018-06-10 NOTE — Telephone Encounter (Signed)
Requesting for medication of ALPRAZolam (XANAX) 0.5 MG tablet dosage to be increased.  Pharmacy:  [facility] [phone] - [place], [place] - [address] AT [facility] [address]

## 2018-06-10 NOTE — Telephone Encounter (Signed)
Contacted patient and informed patient that we could increase to 1.0 mg. Script printed awaiting signature. Pt verbalized understanding. Pt is using [employer]

## 2018-06-15 ENCOUNTER — Other Ambulatory Visit: Payer: Self-pay | Admitting: Family Medicine

## 2018-09-05 ENCOUNTER — Other Ambulatory Visit: Payer: Self-pay | Admitting: Family Medicine

## 2018-09-05 NOTE — Telephone Encounter (Signed)
Pt didn't know he didn't have refills left on medication and is hoping Dr. Richardson Landry will call this in today for him.

## 2018-09-05 NOTE — Telephone Encounter (Signed)
Ok plus two monthly ref 

## 2018-09-07 ENCOUNTER — Encounter: Payer: Self-pay | Admitting: Family Medicine

## 2018-09-07 ENCOUNTER — Other Ambulatory Visit: Payer: Self-pay

## 2018-09-07 ENCOUNTER — Ambulatory Visit (INDEPENDENT_AMBULATORY_CARE_PROVIDER_SITE_OTHER): Payer: 59 | Admitting: Family Medicine

## 2018-09-07 DIAGNOSIS — R21 Rash and other nonspecific skin eruption: Secondary | ICD-10-CM

## 2018-09-07 MED ORDER — DOXYCYCLINE HYCLATE 100 MG PO TABS: 100.0000 mg | ORAL_TABLET | Freq: Two times a day (BID) | ORAL | 0 refills | Status: DC

## 2018-09-07 NOTE — Progress Notes (Signed)
   Subjective:    Patient ID: Nicholas Sharp, male    DOB: 01/19/1966, 53 y.o.   MRN: [id number]  HPItick bite one about two weeks ago around navel. And one this past Sunday on waistline. Red area. Has felt lousy yesterday and today. Temp toay 98.1.   Virtual Visit via Video Note  I connected with Nicholas Sharp on 09/07/18 at  1:10 PM EDT by a video enabled telemedicine application and verified that I am speaking with the correct person using two identifiers.  Location: Patient: home Provider: office   I discussed the limitations of evaluation and management by telemedicine and the availability of in person appointments. The patient expressed understanding and agreed to proceed.  History of Present Illness:    Observations/Objective:   Assessment and Plan:   Follow Up Instructions:    I discussed the assessment and treatment plan with the patient. The patient was provided an opportunity to ask questions and all were answered. The patient agreed with the plan and demonstrated an understanding of the instructions.   The patient was advised to call back or seek an in-person evaluation if the symptoms worsen or if the condition fails to improve as anticipated.  I provided 17 minutes of non-face-to-face time during this encounter.  Patient actually had 2 tick bites.  114 days ago.  Near the navel.  The other several days ago.  In the groin area.  Now with an enlarging red patch.  No central clearing.  Not itchy.  Nontender.  Also systemic features today feeling a bit achy.  Somewhat diminished energy.  Feeling flushed.  No obvious fever       Review of Systems No headache, no major weight loss or weight gain, no chest pain no back pain abdominal pain no change in bowel habits complete ROS otherwise negative     Objective:   Physical Exam   Virtual     Assessment & Plan:  Impression tick bite x2 with second tick bite has been Lyme disease-like rash, and also now  experiencing systemic features.  Suboptimum nature of blood testing discussed.  Will cover empirically with Doxy 100 twice daily 7 days.  Sun protection discussed

## 2018-10-26 ENCOUNTER — Telehealth: Payer: Self-pay | Admitting: *Deleted

## 2018-10-26 NOTE — Telephone Encounter (Signed)
Spoke with patient and he said that he did not want to do a virtual visit, instead he would like to see another provider who could see him in the office. He also stated that he wanted to be seen by August 10. I told him that I would reach out and see if we could get him in by that time but it is not a guarantee. I let him know that someone would call him to get him rescheduled.

## 2018-11-04 ENCOUNTER — Other Ambulatory Visit: Payer: Self-pay

## 2018-11-04 ENCOUNTER — Ambulatory Visit: Payer: 59 | Admitting: Cardiovascular Disease

## 2018-11-04 VITALS — BP 135/91 | HR 64 | Ht 74.0 in | Wt 230.0 lb

## 2018-11-04 DIAGNOSIS — G4710 Hypersomnia, unspecified: Secondary | ICD-10-CM

## 2018-11-04 DIAGNOSIS — R5383 Other fatigue: Secondary | ICD-10-CM

## 2018-11-04 DIAGNOSIS — E785 Hyperlipidemia, unspecified: Secondary | ICD-10-CM | POA: Diagnosis not present

## 2018-11-04 DIAGNOSIS — R072 Precordial pain: Secondary | ICD-10-CM

## 2018-11-04 MED ORDER — METOPROLOL TARTRATE 50 MG PO TABS: ORAL_TABLET | ORAL | 0 refills | Status: DC

## 2018-11-04 NOTE — Patient Instructions (Addendum)
Medication Instructions:  Your physician recommends that you continue on your current medications as directed. Please refer to the Current Medication list given to you today.  If you need a refill on your cardiac medications before your next appointment, please call your pharmacy.   Lab work: Your provider would like for you to return one week prior to the cardiac ct to have the following labs drawn: BMET. You do not need an appointment for the lab. Once in our office lobby there is a podium where you can sign in and ring the doorbell to alert [place] that you are here. The lab is open from 8:00 am to 4:30 pm; closed for lunch from 12:45pm-1:45pm.  If you have labs (blood work) drawn today and your tests are completely normal, you will receive your results only by: [place] (if you have MyChart) OR A paper copy in the mail If you have any lab test that is abnormal or we need to change your treatment, we will call you to review the results.  Testing/Procedures: Your physician has requested that you have cardiac CT. Cardiac computed tomography (CT) is a painless test that uses an x-ray machine to take clear, detailed pictures of your heart. For further information please visit [email or web address].[email or web address]. Please follow instruction sheet as given.  Follow-Up: Follow up after the cardiac ct   Any Other Special Instructions Will Be Listed Below (If Applicable). Your cardiac CT will be scheduled at one of the below locations:   [facility] [address] [place], [place] [address] [phone]) [place] [address] [place], [place] [address] [phone]  Please arrive at the [facility] main entrance of [facility] 30-45 minutes prior to test start time. Proceed to the [facility] Radiology Department (first floor) to check-in and test prep.  Please follow these instructions carefully (unless otherwise  directed):  Hold all erectile dysfunction medications at least 48 hours prior to test.  On the Night Before the Test: . Be sure to Drink plenty of water. . Do not consume any caffeinated/decaffeinated beverages or chocolate 12 hours prior to your test. . Do not take any antihistamines 12 hours prior to your test.  On the Day of the Test: . Drink plenty of water. Do not drink any water within one hour of the test. . Do not eat any food 4 hours prior to the test. . You may take your regular medications prior to the test.  . Take metoprolol (Lopressor) two hours prior to test.      After the Test: . Drink plenty of water. . After receiving IV contrast, you may experience a mild flushed feeling. This is normal. . On occasion, you may experience a mild rash up to 24 hours after the test. This is not dangerous. If this occurs, you can take Benadryl 25 mg and increase your fluid intake. . If you experience trouble breathing, this can be serious. If it is severe call 911 IMMEDIATELY. If it is mild, please call our office. . If you take any of these medications: Glipizide/Metformin, Avandament, Glucavance, please do not take 48 hours after completing test.    Please contact the cardiac imaging nurse navigator should you have any questions/concerns Marchia Bond, RN Navigator Cardiac [facility] [phone] Office  [phone] Cell

## 2018-11-04 NOTE — Progress Notes (Signed)
Cardiology Office Note:    Date:  11/05/2018   ID:  Nicholas Sharp, DOB 10/07/1965, MRN [id number]  PCP:  Nicholas AlbertLuking, William S, MD  Cardiologist:  No primary care provider on file. New Electrophysiologist:  None   Referring MD: Nicholas AlbertLuking, William S, MD   Chief Complaint  Patient presents with  . Chest Pain    History of Present Illness:    Nicholas Sharp is a 53 y.o. male with a hx of hypercholesterolemia presenting with complaints of chest discomfort.  This has been bothering him for several months.  He describes 3 different complaints.  One is an electrical stimulation type sensation that occurs in the left lower intercostal area, last for about 5 or 10 seconds and sounds more like a muscle spasm.  Another is a shooting sensation from his upper chest on either side of his neck that occurs at rest and is very brief.  More concerning is a dull ache pressure-like feeling that he describes with a circular motion over the entire center of his chest.  This also occurs at rest, but is often associated with emotional stress.  He does not have a while he is carrying heavy equipment for running towards an emergency (he is a IT sales professionalfirefighter), but he does notice it when he is riding back from a difficult emergency call.  He is confident the pain is not musculoskeletal.  It does not occur after he has been working out.  He has occasional shortness of breath (for example if he is), but this does not appear to interfere with physical activity.  He feels "generally lousy".  He was diagnosed to have testosterone deficiency and has started treatment with testosterone injections recently.  He does wake up feeling tired and he feels tired throughout the day and falls asleep easily if he is sitting quietly or trying to read ( he scores 15 points on the Epworth Sleepiness Scale), but does not have other episodes of inappropriate daytime hypersomnolence.  He has significant hypercholesterolemia.  Most recent labs from  March show total cholesterol 227, HDL of only 39, LDL of 150 and triglycerides of 146 (a lipid profile performed 1 month before showed even higher TG at 334).  Despite the fact that he appears physically fit he is borderline obese with a BMI of 29.5 and has predominantly abdominal adiposity, suggesting a genetic predisposition to insulin resistance.  He will often drink a case of beer in 1 setting, 3 or 4 times a week.  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  His Sister Nicholas Sharp is also my patient and has a history of coronary dissection.  Past Medical History:  Diagnosis Date  . ADHD (attention deficit hyperactivity disorder)   . Alcohol consumption heavy   . DDD (degenerative disc disease)   . ED (erectile dysfunction)   . GERD (gastroesophageal reflux disease)   . Hyperlipidemia    Diet controlled  . Insomnia    Previously    Past Surgical History:  Procedure Laterality Date  . ESOPHAGOGASTRODUODENOSCOPY     Dr Magod-hiatal hernia, normal esophagus & esophageal bx, gastritis (no bx), normal D1/D2  . ESOPHAGOGASTRODUODENOSCOPY  02/17/2012   Procedure: ESOPHAGOGASTRODUODENOSCOPY (EGD);  Surgeon: Corbin Adeobert M Rourk, MD;  Location: [facility];  Service: Endoscopy;  Laterality: N/A;  3:45  . KNEE SURGERY     x 2   right  . SHOULDER SURGERY     x 2  left    Current Medications: Current Meds  Medication Sig  . testosterone enanthate (DELATESTRYL) 200 MG/ML injection Inject into the muscle every 14 (fourteen) days. For IM use only   Current Facility-Administered Medications for the 11/04/18 encounter (Office Visit) with Thurmon Fairroitoru, Stephanye Finnicum, MD  Medication  . 0.9 %  sodium chloride infusion     Allergies:   Patient has no known allergies.   Social History   Socioeconomic History  . Marital status: Single    Spouse name: Not on file  . Number of children: 1  . Years of education: Not on file  . Highest education level: Not on file  Occupational History  . 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 Financial resource strain: Not on file  . Food insecurity    Worry: Not on file    Inability: Not on file  . Transportation needs    Medical: Not on file    Non-medical: Not on file  Tobacco Use  . Smoking status: Never Smoker  . Smokeless tobacco: Never Used  Substance and Sexual Activity  . Alcohol use: Yes    Alcohol/week: 15.0 standard drinks    Types: 15 Cans of beer per week    Comment: several beers/wk  . Drug use: No  . Sexual activity: Not on file  Lifestyle  . Physical activity    Days per week: Not on file    Minutes per session: Not on file  . Stress: Not on file  Relationships  . Social Musicianconnections    Talks on phone: Not on file    Gets together: Not on file    Attends religious service: Not on file    Active member of club or organization: Not on file    Attends meetings of clubs or organizations: Not on file    Relationship status: Not on file  Other Topics Concern  . Not on file  Social History Narrative   1 son-24 healthy   Lives alone     Family History: The patient'Sharp family history includes Congestive Heart Failure in his mother; Dementia in his mother; Depression in his sister; Diabetes in his mother; Hypertension in his father. There is no history of Colon cancer, Esophageal cancer, Rectal cancer, Stomach cancer, Pancreatic cancer, or Prostate cancer.  ROS:   Please see the history of present illness.     All other systems reviewed and are negative.  EKGs/Labs/Other Studies Reviewed:    The following studies were reviewed today: Spirometry in 2017 showed normal results  EKG:  EKG is  ordered today.  The ekg ordered today demonstrates sinus rhythm AV block (PR 212 ms), otherwise normal.  The QTc is 415 ms.  Recent Labs: 05/02/2018: ALT 53; BUN 24; Creatinine, Ser 0.79; Hemoglobin 17.1; Platelets 265; Potassium 4.2; Sodium 139  Recent Lipid Panel    Component Value  Date/Time   CHOL 225 (H) 05/02/2018 1519   TRIG 334 (H) 05/02/2018 1519   HDL 39 (L) 05/02/2018 1519   CHOLHDL 5.8 (H) 05/02/2018 1519   CHOLHDL 6.0 (H) 06/05/2015 0852   VLDL 62 (H) 06/05/2015 0852   LDLCALC 119 (H) 05/02/2018 1519    Physical Exam:    VS:  BP (!) 135/91   Pulse 64   Ht 6\' 2"  (1.88 m)   Wt 230 lb (104.3 kg)   SpO2 94%   BMI 29.53 kg/m     Wt Readings from Last 3 Encounters:  11/04/18 230 lb (104.3 kg)  05/10/18 241 lb (  109.3 kg)  04/11/18 238 lb (108 kg)     GEN: He appears athletic and fit, but also overweight, wounds abdominal obesity and lean limbs.  Well nourished, well developed in no acute distress HEENT: Normal NECK: No JVD; No carotid bruits LYMPHATICS: No lymphadenopathy CARDIAC: RRR, no murmurs, rubs, gallops RESPIRATORY:  Clear to auscultation without rales, wheezing or rhonchi  ABDOMEN: Soft, non-tender, non-distended MUSCULOSKELETAL:  No edema; No deformity  SKIN: Warm and dry NEUROLOGIC:  Alert and oriented x 3 PSYCHIATRIC:  Normal affect   ASSESSMENT:    1. Precordial pain   2. Dyslipidemia (high LDL; low HDL)   3. Fatigue, unspecified type   4. Hypersomnolence    PLAN:    In order of problems listed above:  1. Chest pain: This has both typical and atypical features.  I am mostly concerned about the dull ache in the center of his chest.  He has a very unfavorable lipid profile, but this is his only real risk factor for coronary disease.  We will schedule him for coronary CT angiogram. Avoid intense physical exertion until we clarify his diagnosis. 2. Dyslipidemia: Multiple features suggest metabolic syndrome/insulin resistance, including low HDL, borderline elevated triglycerides, abdominal distribution of his obesity and a family history of diabetes mellitus.  He needs to reduce the intake of carbs and alcohol, try to lose some weight.  I think he can perform light/moderate exercise, since his chest discomfort is not induced by  physical activity.  Even if he does not have coronary artery disease, his current lipid profile suggest that he would benefit from lipid-lowering treatment. 3. Hypersomnolence: The symptoms that suggest obstructive sleep apnea also overlap with of illness related to hyperandrogenism.  We will address this if he does not get better after the testosterone levels have normalized.   Medication Adjustments/Labs and Tests Ordered: Current medicines are reviewed at length with the patient today.  Concerns regarding medicines are outlined above.  Orders Placed This Encounter  Procedures  . CT CORONARY MORPH W/CTA COR W/SCORE W/CA W/CM &/OR WO/CM  . CT CORONARY FRACTIONAL FLOW RESERVE DATA PREP  . CT CORONARY FRACTIONAL FLOW RESERVE FLUID ANALYSIS  . Basic metabolic panel  . EKG 12-Lead   Meds ordered this encounter  Medications  . DISCONTD: metoprolol tartrate (LOPRESSOR) 50 MG tablet    Sig: Take within two hours of the test    Dispense:  1 tablet    Refill:  0  . metoprolol tartrate (LOPRESSOR) 50 MG tablet    Sig: Take within two hours of the test    Dispense:  1 tablet    Refill:  0    Patient Instructions  Medication Instructions:  Your physician recommends that you continue on your current medications as directed. Please refer to the Current Medication list given to you today.  If you need a refill on your cardiac medications before your next appointment, please call your pharmacy.   Lab work: Your provider would like for you to return one week prior to the cardiac ct to have the following labs drawn: BMET. You do not need an appointment for the lab. Once in our office lobby there is a podium where you can sign in and ring the doorbell to alert [place] that you are here. The lab is open from 8:00 am to 4:30 pm; closed for lunch from 12:45pm-1:45pm.  If you have labs (blood work) drawn today and your tests are completely normal, you will receive your results only by:  MyChart Message (if  you have MyChart) OR A paper copy in the mail If you have any lab test that is abnormal or we need to change your treatment, we will call you to review the results.  Testing/Procedures: Your physician has requested that you have cardiac CT. Cardiac computed tomography (CT) is a painless test that uses an x-ray machine to take clear, detailed pictures of your heart. For further information please visit [email or web address]. Please follow instruction sheet as given.  Follow-Up: Follow up after the cardiac ct   Any Other Special Instructions Will Be Listed Below (If Applicable). Your cardiac CT will be scheduled at one of the below locations:   [facility] [address] [place], [place] [address] [phone]  OR  [facility] [address] [place], [place] [address] [phone]  Please arrive at the [facility] main entrance of [facility]  30-45 minutes prior to test start time. Proceed to the [facility] Radiology Department (first floor) to check-in and test prep.  Please follow these instructions carefully (unless otherwise directed):  Hold all erectile dysfunction medications at least 48 hours prior to test.  On the Night Before the Test: . Be sure to Drink plenty of water. . Do not consume any caffeinated/decaffeinated beverages or chocolate 12 hours prior to your test. . Do not take any antihistamines 12 hours prior to your test.  On the Day of the Test: . Drink plenty of water. Do not drink any water within one hour of the test. . Do not eat any food 4 hours prior to the test. . You may take your regular medications prior to the test.  . Take metoprolol (Lopressor) two hours prior to test.      After the Test: . Drink plenty of water. . After receiving IV contrast, you may experience a mild flushed feeling. This is normal. . On occasion, you may experience a mild rash up to 24 hours after the test.  This is not dangerous. If this occurs, you can take Benadryl 25 mg and increase your fluid intake. . If you experience trouble breathing, this can be serious. If it is severe call 911 IMMEDIATELY. If it is mild, please call our office. . If you take any of these medications: Glipizide/Metformin, Avandament, Glucavance, please do not take 48 hours after completing test.    Please contact the cardiac imaging nurse navigator should you have any questions/concerns Rockwell AlexandriaSara Wallace, RN Navigator Cardiac Imaging [facility] [phone] Office  [phone] Cell           Signed, Thurmon FairMihai Savion Washam, MD  11/05/2018 3:49 PM    [facility]

## 2018-11-05 ENCOUNTER — Encounter: Payer: Self-pay | Admitting: Cardiovascular Disease

## 2018-11-07 ENCOUNTER — Encounter

## 2018-11-07 ENCOUNTER — Ambulatory Visit: Payer: 59 | Admitting: Internal Medicine

## 2018-11-24 ENCOUNTER — Other Ambulatory Visit: Payer: Self-pay

## 2018-11-24 ENCOUNTER — Ambulatory Visit (HOSPITAL_COMMUNITY): Payer: 59

## 2018-11-24 ENCOUNTER — Ambulatory Visit (HOSPITAL_COMMUNITY)
Admission: RE | Admit: 2018-11-24 | Discharge: 2018-11-24 | Disposition: A | Discharge status: Home / Self Care | Payer: 59 | Source: Ambulatory Visit | Attending: Cardiovascular Disease | Admitting: Cardiovascular Disease

## 2018-11-24 ENCOUNTER — Telehealth (HOSPITAL_COMMUNITY): Payer: Self-pay | Admitting: Emergency Medicine

## 2018-11-24 DIAGNOSIS — R072 Precordial pain: Secondary | ICD-10-CM | POA: Insufficient documentation

## 2018-11-24 DIAGNOSIS — Z006 Encounter for examination for normal comparison and control in clinical research program: Secondary | ICD-10-CM

## 2018-11-24 MED ORDER — NITROGLYCERIN 0.4 MG SL SUBL
SUBLINGUAL_TABLET | SUBLINGUAL | Status: AC
  Filled 2018-11-24: qty 2

## 2018-11-24 MED ORDER — IOHEXOL 350 MG/ML SOLN
80.0000 mL | Freq: Once | INTRAVENOUS | Status: AC | PRN
  Administered 2018-11-24: 14:00:00 80 mL via INTRAVENOUS

## 2018-11-24 MED ORDER — METOPROLOL TARTRATE 5 MG/5ML IV SOLN
INTRAVENOUS | Status: AC
  Filled 2018-11-24: qty 5

## 2018-11-24 MED ORDER — METOPROLOL TARTRATE 5 MG/5ML IV SOLN
5.0000 mg | INTRAVENOUS | Status: DC | PRN
  Administered 2018-11-24: 5 mg via INTRAVENOUS
  Filled 2018-11-24 (×2): qty 5

## 2018-11-24 MED ORDER — NITROGLYCERIN 0.4 MG SL SUBL
0.8000 mg | SUBLINGUAL_TABLET | Freq: Once | SUBLINGUAL | Status: AC
  Administered 2018-11-24: 0.8 mg via SUBLINGUAL
  Filled 2018-11-24: qty 25

## 2018-11-24 NOTE — Research (Signed)
Cadfem Informed Consent    Patient Name: Nicholas Sharp   Subject met inclusion and exclusion criteria.  The informed consent form, study requirements and expectations were reviewed with the subject and questions and concerns were addressed prior to the signing of the consent form.  The subject verbalized understanding of the trail requirements.  The subject agreed to participate in the CADFEM trial and signed the informed consent.  The informed consent was obtained prior to performance of any protocol-specific procedures for the subject.  A copy of the signed informed consent was given to the subject and a copy was placed in the subject's medical record.   [name]

## 2018-11-24 NOTE — Telephone Encounter (Signed)
Reaching out to patient to offer assistance regarding upcoming cardiac imaging study; pt verbalizes understanding of appt date/time, parking situation and where to check in, pre-test NPO status and medications ordered, and verified current allergies; name and call back number provided for further questions should they arise Aveyah Greenwood RN Navigator Cardiac Imaging [facility] [phone] office [phone] cell 

## 2018-11-25 ENCOUNTER — Telehealth: Payer: Self-pay | Admitting: *Deleted

## 2018-11-25 ENCOUNTER — Telehealth: Payer: Self-pay

## 2018-11-25 ENCOUNTER — Telehealth: Payer: Self-pay | Admitting: Cardiovascular Disease

## 2018-11-25 DIAGNOSIS — E785 Hyperlipidemia, unspecified: Secondary | ICD-10-CM

## 2018-11-25 NOTE — Telephone Encounter (Signed)
New Message   Patient is calling to get the results to his CT. 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  Patient also states that will be doing some yard work, so it is ok to leave a vm and a number for him to call back if he doesn't answer the phone. Please give patient a call back.

## 2018-11-25 NOTE — Telephone Encounter (Signed)
Called patient and advised of message, LVM to call back if questions but that we would be in contact when we received full report.

## 2018-11-25 NOTE — Telephone Encounter (Signed)
Patient made aware of results and verbalized understanding.  He would like to know when he should follow up with Dr. Sallyanne Kuster to discuss his cholesterol. He would like an appointment.

## 2018-11-25 NOTE — Telephone Encounter (Signed)
Please tell him that the complete report has not yet been finished (radiologist who makes the noncardiac part of the report has done his part, but the coronary part is not yet read; one of my partners who reads CT angios routinely will make that report).   But I looked at the images myself and I think it is a completely normal study.  In my opinion, it is fine for him to go back to work.  Once the full report is ready, I will make sure he gets the information.

## 2018-11-25 NOTE — Telephone Encounter (Signed)
Left a message for the patient to call back.  

## 2018-11-25 NOTE — Telephone Encounter (Signed)
Patient would like to know what 'normal means' Is there no blockages, or plaque buildup? Patient advised I could not view images, he just states he has questions, I advised that the full report was not back yet, but he just wants to make sure he is indeed 'normal' with no issues. Advised I would route to MD.

## 2018-11-25 NOTE — Telephone Encounter (Signed)
-----   Message from Sanda Klein, MD sent at 11/25/2018 12:08 PM EDT ----- Final report ready. Excellent news. Not only does he have no coronary blockages, but calcium score is zero, meaning very good future outlook - low likelihood of developing CAD in next 10 years. There is also no sign of any other serious cardiovascular problem that could cause chest pain. The "top-normal" measurement of his aorta is very much normal for him, in my opinion. He is both tall and strongly-built - his aorta is expected to be at the upper range in size.

## 2018-11-25 NOTE — Telephone Encounter (Signed)
Called patient, advised that Dr.C had not reviewed the results yet but would route a message to MD and Primary to advise-  He would like a call back, if he does not answer okay to leave message on voicemail.

## 2018-11-25 NOTE — Telephone Encounter (Signed)
Although his cholesterol parameters are not perfect, but the finding of a calcium score of 0 on his coronary CT lowers the likelihood of future CAD and makes current medications less important. I do not think he needs to start a statin. I do think he needs to implement a more healthy diet (reduced intake of sugary/starchy foods and saturated fat, increased intake of non-saturated fats such as fish/nuts/olive oil/canola oil and lean protein such as fish/shellfish/nonfat yogurt/low-fat cheeses, etc.).  He also needs to continue to exercise regularly. I think he should do that for at least the next 3 months, recheck a lipid profile before we meet again in clinic

## 2018-12-01 NOTE — Telephone Encounter (Signed)
The patient has been made aware of the recommendations and fasting labs in 3 months.

## 2018-12-07 ENCOUNTER — Other Ambulatory Visit: Payer: Self-pay | Admitting: Family Medicine

## 2018-12-08 NOTE — Telephone Encounter (Signed)
Ok plus one ref 

## 2019-01-10 ENCOUNTER — Ambulatory Visit (INDEPENDENT_AMBULATORY_CARE_PROVIDER_SITE_OTHER): Payer: 59 | Admitting: Family Medicine

## 2019-01-10 ENCOUNTER — Telehealth: Payer: Self-pay | Admitting: Family Medicine

## 2019-01-10 ENCOUNTER — Other Ambulatory Visit: Payer: Self-pay

## 2019-01-10 ENCOUNTER — Encounter: Payer: Self-pay | Admitting: Family Medicine

## 2019-01-10 DIAGNOSIS — M545 Low back pain, unspecified: Secondary | ICD-10-CM

## 2019-01-10 MED ORDER — HYDROCODONE-ACETAMINOPHEN 5-325 MG PO TABS: ORAL_TABLET | ORAL | 0 refills | Status: DC

## 2019-01-10 MED ORDER — DICLOFENAC SODIUM 75 MG PO TBEC: DELAYED_RELEASE_TABLET | ORAL | 0 refills | Status: DC

## 2019-01-10 MED ORDER — OXYCODONE-ACETAMINOPHEN 7.5-325 MG PO TABS: ORAL_TABLET | ORAL | 0 refills | Status: DC

## 2019-01-10 MED ORDER — TIZANIDINE HCL 4 MG PO TABS: 4.0000 mg | ORAL_TABLET | Freq: Three times a day (TID) | ORAL | 0 refills | Status: DC | PRN

## 2019-01-10 NOTE — Telephone Encounter (Signed)
Patient added another question to previous phone message.  He said he was under the impression that Dr. Richardson Landry was going to call in a very powerful steroid medication for him today and he isn't sure he got that.  Said the medication he picked up said "non-steriod" on it? He apologized for calling so many times.

## 2019-01-10 NOTE — Progress Notes (Signed)
   Subjective:    Patient ID: Nicholas Sharp, male    DOB: 12/31/1965, 53 y.o.   MRN: [id number]  Back Pain This is a new problem. The current episode started today. The pain is present in the lumbar spine.  pain started after picking up a cooler this morning.   Back was ok yesterday  Had a flare a week ago   Bent over to pick up cooler    Had sudden back pain felt tight, got some ibu  Review of Systems  Musculoskeletal: Positive for back pain.       Objective:   Physical Exam   Virtual     Assessment & Plan:  Acute lumbar strain.  No neuropathic features.  Fairly severe pain.  Options discussed will cover with prescription anti-inflammatory plus muscle spasm agent plus as needed pain medicine local measures discussed work excuse and encouraged

## 2019-01-10 NOTE — Telephone Encounter (Signed)
Please advise. Thank you

## 2019-01-10 NOTE — Telephone Encounter (Signed)
Pt has tried hydrocodone in the past - pt states it made him "feel really weird & strange"  Pt would like it changed to something else if poosible   Please advise & call pt when done      [facility]

## 2019-01-10 NOTE — Telephone Encounter (Signed)
Ok lets document the weird and strange side effect   rx oxycodone 7.5/325 one q six hrs prn numb 24

## 2019-01-10 NOTE — Telephone Encounter (Signed)
Pt had a question regarding what steroid was sent in. Informed patient that Diclofenac was sent in and that would help any swelling; informed it was an anti-inflammatory agent. Pt verbalized understanding.

## 2019-01-11 ENCOUNTER — Other Ambulatory Visit: Payer: Self-pay | Admitting: Family Medicine

## 2019-01-11 ENCOUNTER — Other Ambulatory Visit: Payer: Self-pay | Admitting: *Deleted

## 2019-01-11 MED ORDER — OXYCODONE-ACETAMINOPHEN 7.5-325 MG PO TABS: ORAL_TABLET | ORAL | 0 refills | Status: DC

## 2019-01-11 NOTE — Telephone Encounter (Signed)
Called [facility] [address] and they do have the prescription in stock. Called [facility] in [place] and left on prescriber voicemail to cancel script sent in since they do not have in stock. Med is pended if you agree to send to [facility] [address] since [place] is out of stock. After sending I will call and let pt know

## 2019-01-11 NOTE — Telephone Encounter (Signed)
Pt.notified

## 2019-01-11 NOTE — Telephone Encounter (Signed)
Patient went to pharmacy in [place] last night to [facility] and they didn't have any.  He said he was in a lot of pain last night and wanted [place] to send it to the [facility]  In [place] on [address]  He asked that we make sure that they have it before sending it because he is hurting and doesn't want to go and they not have it either.

## 2019-01-11 NOTE — Telephone Encounter (Signed)
This was sent in yesterday and I documented hydrocodone on allergy list

## 2019-01-17 ENCOUNTER — Other Ambulatory Visit: Payer: Self-pay | Admitting: Family Medicine

## 2019-01-17 NOTE — Telephone Encounter (Signed)
Ok plus two ref write "use sparinglly "on it, must last one mo

## 2019-01-24 ENCOUNTER — Ambulatory Visit (INDEPENDENT_AMBULATORY_CARE_PROVIDER_SITE_OTHER): Payer: 59 | Admitting: Family Medicine

## 2019-01-24 DIAGNOSIS — R509 Fever, unspecified: Secondary | ICD-10-CM

## 2019-01-24 NOTE — Progress Notes (Signed)
   Subjective:    Patient ID: Nicholas Sharp, male    DOB: 1965-07-31, 53 y.o.   MRN: [id number]  Fever  This is a new problem. Episode onset: last night. Associated symptoms comments: Started feeling horrible last night, fever over 100, chills, shivering uncontrollable, rapid heart beat, felt pulse in his head, nasal drainage, LLQ pain that has been going on for about a week.  Pt states he did take a Percocet and Ibuprofen last night. He thinks he took them to close together. Pt states he had a few drinks with friends last night. Pt states he feels his BP was extremely high. Pt did have COVID test this morning.   Pt would like to speak with provider about coming off some of his meds.  Virtual Visit via Video Note  I connected with Nicholas Sharp on 01/24/19 at  3:50 PM EDT by a video enabled telemedicine application and verified that I am speaking with the correct person using two identifiers.  Location: Patient: home Provider: office   I discussed the limitations of evaluation and management by telemedicine and the availability of in person appointments. The patient expressed understanding and agreed to proceed.  History of Present Illness:    Observations/Objective:   Assessment and Plan:   Follow Up Instructions:    I discussed the assessment and treatment plan with the patient. The patient was provided an opportunity to ask questions and all were answered. The patient agreed with the plan and demonstrated an understanding of the instructions.   The patient was advised to call back or seek an in-person evaluation if the symptoms worsen or if the condition fails to improve as anticipated.  I provided 25 minutes of non-face-to-face time during this encounter.   Vicente Males, LPN  Patient had numerous concerns.  Concerned about his Xanax.  Does not want to take it much longer.  Trying to wean off.  Worried that he might be stuck.  I reminded him he gets 30  tablets/month.  This is basically a nonaddictive dose.  However of course I do encourage him to try to get off the medication if at all possible.  Parameters discussed  Patient notes achiness fever.  No cough no congestion no sore throat.  However substantial diminishing energy.  Appetite not the best  Also notes more abdominal pain left lower abdomen.  Nonspecific 1 weeks duration.  Sometimes worse with certain motions   Review of Systems  Constitutional: Positive for fever.  See above     Objective:   Physical Exam    Virtual    Assessment & Plan:  Impression chronic medication concerns.  Discussed.  2.  History alcohol abuse alcohol intake discussed.  3.  Febrile illness.  Patient does work as a Agricultural consultant.  There are some exposures.  In the process of COVID-19 testing which I think is a good idea  4.  Left lower abdominal pain warning signs discussed  Greater than 50% of this 25 minute face to face visit was spent in counseling and discussion and coordination of care regarding the above diagnosis/diagnosies

## 2019-01-25 ENCOUNTER — Encounter (HOSPITAL_COMMUNITY): Payer: Self-pay | Admitting: Emergency Medicine

## 2019-01-25 ENCOUNTER — Other Ambulatory Visit: Payer: Self-pay

## 2019-01-25 ENCOUNTER — Emergency Department (HOSPITAL_COMMUNITY): Payer: 59

## 2019-01-25 ENCOUNTER — Ambulatory Visit (INDEPENDENT_AMBULATORY_CARE_PROVIDER_SITE_OTHER): Payer: 59 | Admitting: Family Medicine

## 2019-01-25 ENCOUNTER — Inpatient Hospital Stay (HOSPITAL_COMMUNITY)
Admission: EM | Admit: 2019-01-25 | Discharge: 2019-01-27 | DRG: 392 | Disposition: A | Discharge status: Home / Self Care | Payer: 59 | Attending: Internal Medicine | Admitting: Internal Medicine

## 2019-01-25 DIAGNOSIS — E349 Endocrine disorder, unspecified: Secondary | ICD-10-CM | POA: Diagnosis present

## 2019-01-25 DIAGNOSIS — K219 Gastro-esophageal reflux disease without esophagitis: Secondary | ICD-10-CM | POA: Diagnosis present

## 2019-01-25 DIAGNOSIS — Z7952 Long term (current) use of systemic steroids: Secondary | ICD-10-CM

## 2019-01-25 DIAGNOSIS — Z789 Other specified health status: Secondary | ICD-10-CM | POA: Diagnosis not present

## 2019-01-25 DIAGNOSIS — R1032 Left lower quadrant pain: Secondary | ICD-10-CM

## 2019-01-25 DIAGNOSIS — R509 Fever, unspecified: Secondary | ICD-10-CM

## 2019-01-25 DIAGNOSIS — I10 Essential (primary) hypertension: Secondary | ICD-10-CM | POA: Diagnosis present

## 2019-01-25 DIAGNOSIS — E785 Hyperlipidemia, unspecified: Secondary | ICD-10-CM | POA: Diagnosis present

## 2019-01-25 DIAGNOSIS — K5792 Diverticulitis of intestine, part unspecified, without perforation or abscess without bleeding: Secondary | ICD-10-CM

## 2019-01-25 DIAGNOSIS — Z79899 Other long term (current) drug therapy: Secondary | ICD-10-CM

## 2019-01-25 DIAGNOSIS — K5720 Diverticulitis of large intestine with perforation and abscess without bleeding: Secondary | ICD-10-CM | POA: Diagnosis present

## 2019-01-25 DIAGNOSIS — Z8249 Family history of ischemic heart disease and other diseases of the circulatory system: Secondary | ICD-10-CM

## 2019-01-25 DIAGNOSIS — Z833 Family history of diabetes mellitus: Secondary | ICD-10-CM

## 2019-01-25 DIAGNOSIS — F909 Attention-deficit hyperactivity disorder, unspecified type: Secondary | ICD-10-CM | POA: Diagnosis present

## 2019-01-25 DIAGNOSIS — Z20828 Contact with and (suspected) exposure to other viral communicable diseases: Secondary | ICD-10-CM | POA: Diagnosis present

## 2019-01-25 DIAGNOSIS — Z79891 Long term (current) use of opiate analgesic: Secondary | ICD-10-CM

## 2019-01-25 DIAGNOSIS — E039 Hypothyroidism, unspecified: Secondary | ICD-10-CM | POA: Diagnosis present

## 2019-01-25 HISTORY — DX: Diverticulitis of intestine, part unspecified, without perforation or abscess without bleeding: K57.92

## 2019-01-25 LAB — COMPREHENSIVE METABOLIC PANEL
ALT: 57 U/L — ABNORMAL HIGH (ref 0–44)
AST: 26 U/L (ref 15–41)
Albumin: 3.7 g/dL (ref 3.5–5.0)
Alkaline Phosphatase: 77 U/L (ref 38–126)
Anion gap: 9 (ref 5–15)
BUN: 15 mg/dL (ref 6–20)
CO2: 27 mmol/L (ref 22–32)
Calcium: 8.6 mg/dL — ABNORMAL LOW (ref 8.9–10.3)
Chloride: 100 mmol/L (ref 98–111)
Creatinine, Ser: 0.7 mg/dL (ref 0.61–1.24)
GFR calc Af Amer: >60 mL/min (ref >60)
GFR calc non Af Amer: >60 mL/min (ref >60)
Glucose, Bld: 116 mg/dL — ABNORMAL HIGH (ref 70–99)
Potassium: 3.8 mmol/L (ref 3.5–5.1)
Sodium: 136 mmol/L (ref 135–145)
Total Bilirubin: 1 mg/dL (ref 0.3–1.2)
Total Protein: 7.2 g/dL (ref 6.5–8.1)

## 2019-01-25 LAB — CBC
HCT: 55.7 % — ABNORMAL HIGH (ref 39.0–52.0)
Hemoglobin: 17.4 g/dL — ABNORMAL HIGH (ref 13.0–17.0)
MCH: 28.9 pg (ref 26.0–34.0)
MCHC: 31.2 g/dL (ref 30.0–36.0)
MCV: 92.4 fL (ref 80.0–100.0)
Platelets: 216 10*3/uL (ref 150–400)
RBC: 6.03 MIL/uL — ABNORMAL HIGH (ref 4.22–5.81)
RDW: 14.4 % (ref 11.5–15.5)
WBC: 12.8 10*3/uL — ABNORMAL HIGH (ref 4.0–10.5)
nRBC: 0 % (ref 0.0–0.2)

## 2019-01-25 LAB — LIPASE, BLOOD: Lipase: 29 U/L (ref 11–51)

## 2019-01-25 LAB — URINALYSIS, ROUTINE W REFLEX MICROSCOPIC
Bacteria, UA: NONE SEEN
Bilirubin Urine: NEGATIVE
Glucose, UA: NEGATIVE mg/dL
Ketones, ur: NEGATIVE mg/dL
Leukocytes,Ua: NEGATIVE
Nitrite: NEGATIVE
Protein, ur: NEGATIVE mg/dL
Specific Gravity, Urine: 1.008 (ref 1.005–1.030)
pH: 7 (ref 5.0–8.0)

## 2019-01-25 LAB — SARS CORONAVIRUS 2 BY RT PCR (HOSPITAL ORDER, PERFORMED IN ~~LOC~~ HOSPITAL LAB): SARS Coronavirus 2: NEGATIVE

## 2019-01-25 MED ORDER — FOLIC ACID 1 MG PO TABS
1.0000 mg | ORAL_TABLET | Freq: Every day | ORAL | Status: DC
  Administered 2019-01-25 – 2019-01-27 (×3): 1 mg via ORAL
  Filled 2019-01-25 (×3): qty 1

## 2019-01-25 MED ORDER — LORAZEPAM 1 MG PO TABS: 1.0000 mg | ORAL_TABLET | ORAL | Status: DC | PRN

## 2019-01-25 MED ORDER — IOHEXOL 300 MG/ML  SOLN
100.0000 mL | Freq: Once | INTRAMUSCULAR | Status: AC | PRN
  Administered 2019-01-25: 100 mL via INTRAVENOUS

## 2019-01-25 MED ORDER — SODIUM CHLORIDE 0.9% FLUSH: 3.0000 mL | Freq: Once | INTRAVENOUS | Status: DC

## 2019-01-25 MED ORDER — METRONIDAZOLE IN NACL 5-0.79 MG/ML-% IV SOLN
500.0000 mg | Freq: Three times a day (TID) | INTRAVENOUS | Status: DC
  Administered 2019-01-26 – 2019-01-27 (×5): 500 mg via INTRAVENOUS
  Filled 2019-01-25 (×5): qty 100

## 2019-01-25 MED ORDER — CIPROFLOXACIN IN D5W 400 MG/200ML IV SOLN
400.0000 mg | Freq: Once | INTRAVENOUS | Status: AC
  Administered 2019-01-25: 400 mg via INTRAVENOUS
  Filled 2019-01-25: qty 200

## 2019-01-25 MED ORDER — ALPRAZOLAM 0.25 MG PO TABS
0.2500 mg | ORAL_TABLET | Freq: Three times a day (TID) | ORAL | Status: DC | PRN
  Administered 2019-01-25: 0.25 mg via ORAL
  Filled 2019-01-25: qty 1

## 2019-01-25 MED ORDER — SODIUM CHLORIDE 0.9 % IV BOLUS
500.0000 mL | Freq: Once | INTRAVENOUS | Status: AC
  Administered 2019-01-25: 500 mL via INTRAVENOUS

## 2019-01-25 MED ORDER — ENOXAPARIN SODIUM 40 MG/0.4ML ~~LOC~~ SOLN
40.0000 mg | SUBCUTANEOUS | Status: DC
  Administered 2019-01-26: 40 mg via SUBCUTANEOUS
  Filled 2019-01-25: qty 0.4

## 2019-01-25 MED ORDER — SENNOSIDES-DOCUSATE SODIUM 8.6-50 MG PO TABS: 1.0000 | ORAL_TABLET | Freq: Every evening | ORAL | Status: DC | PRN

## 2019-01-25 MED ORDER — LORAZEPAM 2 MG/ML IJ SOLN: 1.0000 mg | INTRAMUSCULAR | Status: DC | PRN

## 2019-01-25 MED ORDER — METRONIDAZOLE IN NACL 5-0.79 MG/ML-% IV SOLN
500.0000 mg | Freq: Once | INTRAVENOUS | Status: AC
  Administered 2019-01-25: 500 mg via INTRAVENOUS
  Filled 2019-01-25: qty 100

## 2019-01-25 MED ORDER — ONDANSETRON HCL 4 MG/2ML IJ SOLN: 4.0000 mg | Freq: Four times a day (QID) | INTRAMUSCULAR | Status: DC | PRN

## 2019-01-25 MED ORDER — THIAMINE HCL 100 MG/ML IJ SOLN: 100.0000 mg | Freq: Every day | INTRAMUSCULAR | Status: DC

## 2019-01-25 MED ORDER — ACETAMINOPHEN 325 MG PO TABS
650.0000 mg | ORAL_TABLET | Freq: Four times a day (QID) | ORAL | Status: DC | PRN
  Administered 2019-01-25 – 2019-01-26 (×2): 650 mg via ORAL
  Filled 2019-01-25 (×2): qty 2

## 2019-01-25 MED ORDER — VITAMIN B-1 100 MG PO TABS
100.0000 mg | ORAL_TABLET | Freq: Every day | ORAL | Status: DC
  Administered 2019-01-25 – 2019-01-27 (×3): 100 mg via ORAL
  Filled 2019-01-25 (×3): qty 1

## 2019-01-25 MED ORDER — ONDANSETRON HCL 4 MG PO TABS: 4.0000 mg | ORAL_TABLET | Freq: Four times a day (QID) | ORAL | Status: DC | PRN

## 2019-01-25 MED ORDER — OXYCODONE-ACETAMINOPHEN 5-325 MG PO TABS: 1.0000 | ORAL_TABLET | Freq: Four times a day (QID) | ORAL | Status: DC | PRN

## 2019-01-25 MED ORDER — SODIUM CHLORIDE 0.9 % IV SOLN
INTRAVENOUS | Status: DC
  Administered 2019-01-25: 22:00:00 via INTRAVENOUS

## 2019-01-25 MED ORDER — CIPROFLOXACIN IN D5W 400 MG/200ML IV SOLN
400.0000 mg | Freq: Two times a day (BID) | INTRAVENOUS | Status: DC
  Administered 2019-01-26 – 2019-01-27 (×3): 400 mg via INTRAVENOUS
  Filled 2019-01-25 (×3): qty 200

## 2019-01-25 MED ORDER — ADULT MULTIVITAMIN W/MINERALS CH
1.0000 | ORAL_TABLET | Freq: Every day | ORAL | Status: DC
  Administered 2019-01-25 – 2019-01-27 (×3): 1 via ORAL
  Filled 2019-01-25 (×3): qty 1

## 2019-01-25 NOTE — ED Provider Notes (Addendum)
[facility] EMERGENCY DEPARTMENT Provider Note   CSN: [id number] Arrival date & time: 01/25/19  1302     History   Chief Complaint Chief Complaint  Patient presents with   Abdominal Pain    HPI Nicholas Sharp is a 53 y.o. male.     HPI   He presents for evaluation of abdominal pain left lower, radiating to his rectum, present for about a week, worsening over the last 2 days.  No nausea, vomiting or diarrhea.  No blood in stool.  He has had decreased appetite for 2 days.  Yesterday, he got a Covid test done at a local pharmacy.  He denies fever, chills, known sick exposure, cough, shortness of breath or chest pain.  There are no other known modifying factors.  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   Other and unspecified hyperlipidemia 08/01/2012   Atypical chest pain 02/12/2012   GERD (gastroesophageal reflux disease) 02/12/2012    Past Surgical History:  Procedure Laterality Date   ESOPHAGOGASTRODUODENOSCOPY     Dr Magod-hiatal hernia, normal esophagus & esophageal bx, gastritis (no bx), normal D1/D2   ESOPHAGOGASTRODUODENOSCOPY  02/17/2012   Procedure: ESOPHAGOGASTRODUODENOSCOPY (EGD);  Surgeon: Corbin Adeobert M Rourk, MD;  Location: [facility];  Service: Endoscopy;  Laterality: N/A;  3:45   KNEE SURGERY     x 2   right   SHOULDER SURGERY     x 2  left        Home Medications    Prior to Admission  medications   Medication Sig Start Date End Date Taking? Authorizing Provider  ALPRAZolam Prudy Feeler(XANAX) 1 MG tablet Take one tablet po prn TID for anxiety ; use sparingly, must last one month 01/17/19   Merlyn AlbertLuking, William S, MD  Ascorbic Acid (VITAMIN C) 1000 MG tablet Take 1,000 mg by mouth daily.    [provider]  diclofenac (VOLTAREN) 75 MG EC tablet Take one tablet bid with food 01/10/19   Merlyn AlbertLuking, William S, MD  hydrOXYzine (ATARAX/VISTARIL) 50 MG tablet TAKE 1 TABLET BY MOUTH TWICE DAILY MAY CAUSE DROWSINESS 06/15/18   Merlyn AlbertLuking, William S, MD  metoprolol tartrate (LOPRESSOR) 50 MG tablet Take within two hours of the test 11/04/18   Croitoru, Mihai, MD  Multiple Vitamin (MULTIVITAMIN) tablet Take 1 tablet by mouth daily.    [provider]  oxyCODONE-acetaminophen (PERCOCET) 7.5-325 MG tablet Take one tablet po every 6 hrs prn pain 01/11/19   Merlyn AlbertLuking, William S, MD  oxyCODONE-acetaminophen (PERCOCET/ROXICET) 5-325 MG tablet Take 1 tablet by mouth every 6 (six) hours as needed. 01/17/19   [provider]  predniSONE (STERAPRED UNI-PAK 21 TAB) 10 MG (21) TBPK tablet See admin instructions. see package 01/20/19   [provider]  testosterone cypionate (DEPOTESTOSTERONE CYPIONATE) 200 MG/ML injection ADM 0.5 ML IM Q 1 WK 01/03/19   [provider]  testosterone enanthate (DELATESTRYL) 200 MG/ML injection Inject into the muscle every  14 (fourteen) days. For IM use only    [provider]  tiZANidine (ZANAFLEX) 4 MG tablet Take 1 tablet (4 mg total) by mouth every 8 (eight) hours as needed for muscle spasms. 01/10/19   Merlyn Albert, MD    Family History Family History  Problem Relation Age of Onset   Diabetes Mother    Dementia Mother    Congestive Heart Failure Mother    Hypertension Father    Depression Sister    Colon cancer Neg Hx    Esophageal cancer Neg Hx    Rectal cancer Neg Hx    Stomach cancer Neg Hx    Pancreatic cancer Neg  Hx    Prostate cancer Neg Hx     Social History Social History   Tobacco Use   Smoking status: Never Smoker   Smokeless tobacco: Never Used  Substance Use Topics   Alcohol use: Yes    Alcohol/week: 15.0 standard drinks    Types: 15 Cans of beer per week    Comment: several beers/wk   Drug use: No     Allergies   Hydrocodone   Review of Systems Review of Systems  All other systems reviewed and are negative.    Physical Exam Updated Vital Signs BP (!) 148/106 (BP Location: Right Arm)    Pulse 96    Temp 98.2 F (36.8 C) (Oral)    Resp 16    Ht 6\' 2"  (1.88 m)    Wt 102.1 kg    SpO2 97%    BMI 28.89 kg/m   Physical Exam Vitals signs and nursing note reviewed.  Constitutional:      General: He is not in acute distress.    Appearance: He is well-developed. He is not ill-appearing or diaphoretic.  HENT:     Head: Normocephalic and atraumatic.     Right Ear: External ear normal.     Left Ear: External ear normal.  Eyes:     Conjunctiva/sclera: Conjunctivae normal.     Pupils: Pupils are equal, round, and reactive to light.  Neck:     Musculoskeletal: Normal range of motion and neck supple.     Trachea: Phonation normal.  Cardiovascular:     Rate and Rhythm: Normal rate and regular rhythm.     Heart sounds: Normal heart sounds.  Pulmonary:     Effort: Pulmonary effort is normal.     Breath sounds: Normal breath sounds.  Abdominal:     General: There is no distension.     Palpations: Abdomen is soft. There is no mass.     Tenderness: There is abdominal tenderness (Left lower quadrant, mild).     Hernia: No hernia is present.  Musculoskeletal: Normal range of motion.  Skin:    General: Skin is warm and dry.  Neurological:     Mental Status: He is alert and oriented to person, place, and time.     Cranial Nerves: No cranial nerve deficit.     Sensory: No sensory deficit.     Motor: No abnormal muscle tone.     Coordination: Coordination normal.    Psychiatric:        Mood and Affect: Mood normal.        Behavior: Behavior normal.        Thought Content: Thought content normal.        Judgment: Judgment normal.      ED Treatments / Results  Labs (all labs ordered are listed, but only  abnormal results are displayed) Labs Reviewed  CBC - Abnormal; Notable for the following components:      Result Value   WBC 12.8 (*)    RBC 6.03 (*)    Hemoglobin 17.4 (*)    HCT 55.7 (*)    All other components within normal limits  URINALYSIS, ROUTINE W REFLEX MICROSCOPIC - Abnormal; Notable for the following components:   APPearance HAZY (*)    Hgb urine dipstick SMALL (*)    All other components within normal limits  SARS CORONAVIRUS 2 BY RT PCR (HOSPITAL ORDER, PERFORMED IN [facility])  LIPASE, BLOOD  COMPREHENSIVE METABOLIC PANEL    EKG None  Radiology No results found.  Procedures Procedures (including critical care time)  Medications Ordered in ED Medications  sodium chloride flush (NS) 0.9 % injection 3 mL (3 mLs Intravenous Not Given 01/25/19 1412)  iohexol (OMNIPAQUE) 300 MG/ML solution 100 mL (has no administration in time range)  sodium chloride 0.9 % bolus 500 mL (0 mLs Intravenous Stopped 01/25/19 1426)     Initial Impression / Assessment and Plan / ED Course  I have reviewed the triage vital signs and the nursing notes.  Pertinent labs & imaging results that were available during my care of the patient were reviewed by me and considered in my medical decision making (see chart for details).  Clinical Course as of Jan 25 1523  Wed Jan 25, 2019  1522 Normal except white count high  CBC(!) [EW]  1522 Normal  SARS Coronavirus 2 by RT PCR (hospital order, performed in [facility] hospital lab) Nasopharyngeal Nasopharyngeal Swab [EW]  1522 Normal except small blood on dipstick  Urinalysis, Routine w reflex microscopic(!) [EW]    Clinical Course User Index [EW] Mancel Bale, MD         Patient Vitals for the past 24 hrs:  BP Temp Temp src Pulse Resp SpO2 Height Weight  01/25/19 1311 (!) 148/106 98.2 F (36.8 C) Oral 96 16 97 %  (1.88 m) 102.1 kg    3:24 PM Reevaluation with update and discussion. After initial assessment and treatment, an updated evaluation reveals no change in status, CT pending.  Patient updated. Mancel Bale   Medical Decision Making: Abdominal pain, ongoing without resolution, and symptoms concerning for diverticular disease progression now with diverticulitis.  No prior episodes of diverticulitis but he does have known diverticular disease.  No clinical evidence for sepsis, or suspected metabolic instability.  Doubt prostatitis as etiology, imaging can show prostatic inflammation if it is present.  Screening for COVID-19 negative.  JONTRELL BUSHONG was evaluated in Emergency Department on 01/25/2019 for the symptoms described in the history of present illness. He was evaluated in the context of the global COVID-19 pandemic, which necessitated consideration that the patient might be at risk for infection with the SARS-CoV-2 virus that causes COVID-19. Institutional protocols and algorithms that pertain to the evaluation of patients at risk for COVID-19 are in a state of rapid change based on information released by regulatory bodies including the CDC and federal and state organizations. These policies and algorithms were followed during the patient's care in the ED.  CRITICAL CARE- no Performed by: Mancel Bale  Nursing Notes Reviewed/ Care Coordinated Applicable Imaging Reviewed Interpretation of Laboratory Data incorporated into ED treatment  Plan-disposition by Dr. Rubin Payor following CT imaging.  Final Clinical Impressions(s) / ED Diagnoses   Final diagnoses:  Left lower quadrant abdominal pain    ED Discharge Orders  None       Daleen Bo, MD 01/25/19 1524    Daleen Bo, MD 02/06/19 1745

## 2019-01-25 NOTE — ED Provider Notes (Signed)
  Physical Exam  BP (!) 148/106 (BP Location: Right Arm)   Pulse 96   Temp 98.2 F (36.8 C) (Oral)   Resp 16   Ht 6\' 2"  (1.88 m)   Wt 102.1 kg   SpO2 97%   BMI 28.89 kg/m   Physical Exam  ED Course/Procedures   Clinical Course as of Jan 25 1732  Wed Jan 25, 2019  1522 Normal except white count high  CBC(!) [EW]  1522 Normal  SARS Coronavirus 2 by RT PCR (hospital order, performed in [facility] hospital lab) Nasopharyngeal Nasopharyngeal Swab [EW]  1522 Normal except small blood on dipstick  Urinalysis, Routine w reflex microscopic(!) [EW]    Clinical Course User Index [EW] Daleen Bo, MD    Procedures  MDM  Patient with abdominal pain.  CT scan shows perforated diverticulitis.  White count mildly elevated.  Will admit to hospitalist.      Davonna Belling, MD 01/25/19 [phone]

## 2019-01-25 NOTE — H&P (Signed)
TRH H&P   Patient Demographics:    Nicholas Sharp, is a 53 y.o. male  MRN: [id number]   DOB - 05/15/1965  Admit Date - 01/25/2019  Outpatient Primary MD for the patient is Gerda DissLuking, Vilinda BlanksWilliam S, MD  Referring MD/NP/PA: Dr Effie ShyWentz  Patient coming from: Home  Chief Complaint  Patient presents with  . Abdominal Pain      HPI:    Nicholas Sharp  is a 53 y.o. male, with past medical history for GERD, hyperlipidemia, presents to ED secondary to complaints of abdominal pain, reports pain for last 48 hours, left lower abdomen, radiating to his rectum, denies any nausea, vomiting, diarrhea or constipation, no bright red blood per rectum or melena, denies fever, chills, cough, or COVID-19 exposure, patient reports history of diverticulosis diagnosed with a screening colon[place] years ago in [place]. - in ED patient with mild leukocytosis at 12 K, T abdomen pelvis significant for acute diverticulitis with microperforation, started on Cipro and Flagyl and I was requested to admit/.    Review of systems:    In addition to the HPI above,  Denies fever at home No Headache, No changes with Vision or hearing, No problems swallowing food or Liquids, No Chest pain, Cough or Shortness of Breath, Reports abdominal pain, No Nausea or Vommitting, Bowel movements are regular, No Blood in stool or Urine, No dysuria, No new skin rashes or bruises, No new joints pains-aches,  No new weakness, tingling, numbness in any extremity, No recent weight gain or loss, No polyuria, polydypsia or polyphagia, No significant Mental Stressors.  A full 10 point Review of Systems was done, except as stated above, all other Review of Systems were negative.   With Past History of the following :    Past Medical History:  Diagnosis Date  . ADHD (attention deficit hyperactivity disorder)   . Alcohol  consumption heavy   . DDD (degenerative disc disease)   . ED (erectile dysfunction)   . GERD (gastroesophageal reflux disease)   . Hyperlipidemia    Diet controlled  . Insomnia    Previously      Past Surgical History:  Procedure Laterality Date  . ESOPHAGOGASTRODUODENOSCOPY     Dr Magod-hiatal hernia, normal esophagus & esophageal bx, gastritis (no bx), normal D1/D2  . ESOPHAGOGASTRODUODENOSCOPY  02/17/2012   Procedure: ESOPHAGOGASTRODUODENOSCOPY (EGD);  Surgeon: Corbin Adeobert M Rourk, MD;  Location: [facility];  Service: Endoscopy;  Laterality: N/A;  3:45  . KNEE SURGERY     x 2   right  . SHOULDER SURGERY     x 2  left      Social History:     Social History   Tobacco Use  . Smoking status: Never Smoker  . Smokeless tobacco: Never Used  Substance Use Topics  . Alcohol use: Yes    Alcohol/week: 15.0 standard drinks    Types: 15 Cans  of beer per week    Comment: several beers/wk        Family History :     Family History  Problem Relation Age of Onset  . Diabetes Mother   . Dementia Mother   . Congestive Heart Failure Mother   . Hypertension Father   . Depression Sister   . Colon cancer Neg Hx   . Esophageal cancer Neg Hx   . Rectal cancer Neg Hx   . Stomach cancer Neg Hx   . Pancreatic cancer Neg Hx   . Prostate cancer Neg Hx       Home Medications:   Prior to Admission medications   Medication Sig Start Date End Date Taking? Authorizing Provider  acetaminophen (TYLENOL) 500 MG tablet Take 500-1,000 mg by mouth every 6 (six) hours as needed for mild pain or moderate pain.   Yes [provider]  ALPRAZolam Prudy Feeler) 1 MG tablet Take one tablet po prn TID for anxiety ; use sparingly, must last one month Patient taking differently: Take 1 mg by mouth at bedtime. *May take one tablet up to three times daily as needed for anxiety* 01/17/19  Yes Merlyn Albert, MD  Ascorbic Acid (VITAMIN C) 1000 MG tablet Take 1,000 mg by mouth daily.   Yes  [provider]  oxyCODONE-acetaminophen (PERCOCET/ROXICET) 5-325 MG tablet Take 1 tablet by mouth every 6 (six) hours as needed for moderate pain.  01/17/19  Yes [provider]  predniSONE (STERAPRED UNI-PAK 21 TAB) 10 MG (21) TBPK tablet Take 10 mg by mouth See admin instructions. 6 day course starting on 01/20/2019. (6,5,4,3,2,1) 01/20/19  Yes [provider]  testosterone cypionate (DEPOTESTOSTERONE CYPIONATE) 200 MG/ML injection Inject 100 mg into the muscle every Friday.  01/03/19  Yes [provider]  tiZANidine (ZANAFLEX) 4 MG tablet Take 1 tablet (4 mg total) by mouth every 8 (eight) hours as needed for muscle spasms. 01/10/19  Yes Merlyn Albert, MD  hydrOXYzine (ATARAX/VISTARIL) 50 MG tablet TAKE 1 TABLET BY MOUTH TWICE DAILY MAY CAUSE DROWSINESS Patient not taking: Reported on 01/25/2019 06/15/18   Merlyn Albert, MD  metoprolol tartrate (LOPRESSOR) 50 MG tablet Take within two hours of the test Patient not taking: Reported on 01/25/2019 11/04/18   Croitoru, Rachelle Hora, MD  oxyCODONE-acetaminophen (PERCOCET) 7.5-325 MG tablet Take one tablet po every 6 hrs prn pain Patient not taking: Reported on 01/25/2019 01/11/19   Merlyn Albert, MD     Allergies:     Allergies  Allergen Reactions  . Hydrocodone     Felt really weird and strange     Physical Exam:   Vitals  Blood pressure (!) 136/95, pulse 94, temperature 98.2 F (36.8 C), temperature source Oral, resp. rate 17, height 6\' 2"  (1.88 m), weight 102.1 kg, SpO2 98 %.   1. General developed male, lying in bed in NAD,    2. Normal affect and insight, Not Suicidal or Homicidal, Awake Alert, Oriented X 3.  3. No F.N deficits, ALL C.Nerves Intact, Strength 5/5 all 4 extremities, Sensation intact all 4 extremities, Plantars down going.  4. Ears and Eyes appear Normal, Conjunctivae clear, PERRLA. Moist Oral Mucosa.  5. Supple Neck, No JVD, No cervical lymphadenopathy appriciated, No  Carotid Bruits.  6. Symmetrical Chest wall movement, Good air movement bilaterally, CTAB.  7. RRR, No Gallops, Rubs or Murmurs, No Parasternal Heave.  8. Positive Bowel Sounds, Abdomen Soft, mild tenderness in left lower abdomen, no organomegaly appriciated,No rebound -guarding  or rigidity.  9.  No Cyanosis, Normal Skin Turgor, No Skin Rash or Bruise.  10. Good muscle tone,  joints appear normal , no effusions, Normal ROM.  11. No Palpable Lymph Nodes in Neck or Axillae    Data Review:    CBC Recent Labs  Lab 01/25/19 1400  WBC 12.8*  HGB 17.4*  HCT 55.7*  PLT 216  MCV 92.4  MCH 28.9  MCHC 31.2  RDW 14.4   ------------------------------------------------------------------------------------------------------------------  Chemistries  Recent Labs  Lab 01/25/19 1400  NA 136  K 3.8  CL 100  CO2 27  GLUCOSE 116*  BUN 15  CREATININE 0.70  CALCIUM 8.6*  AST 26  ALT 57*  ALKPHOS 77  BILITOT 1.0   ------------------------------------------------------------------------------------------------------------------ estimated creatinine clearance is 137.8 mL/min (by C-G formula based on SCr of 0.7 mg/dL). ------------------------------------------------------------------------------------------------------------------ No results for input(s): TSH, T4TOTAL, T3FREE, THYROIDAB in the last 72 hours.  Invalid input(s): FREET3  Coagulation profile No results for input(s): INR, PROTIME in the last 168 hours. ------------------------------------------------------------------------------------------------------------------- No results for input(s): DDIMER in the last 72 hours. -------------------------------------------------------------------------------------------------------------------  Cardiac Enzymes No results for input(s): CKMB, TROPONINI, MYOGLOBIN in the last 168 hours.  Invalid input(s): CK  ------------------------------------------------------------------------------------------------------------------ No results found for: BNP   ---------------------------------------------------------------------------------------------------------------  Urinalysis    Component Value Date/Time   COLORURINE YELLOW 01/25/2019 1315   APPEARANCEUR HAZY (A) 01/25/2019 1315   LABSPEC 1.008 01/25/2019 1315   PHURINE 7.0 01/25/2019 1315   GLUCOSEU NEGATIVE 01/25/2019 1315   HGBUR SMALL (A) 01/25/2019 1315   BILIRUBINUR NEGATIVE 01/25/2019 [place] 01/25/2019 1315   PROTEINUR NEGATIVE 01/25/2019 1315   UROBILINOGEN 0.2 08/22/2014 1053   NITRITE NEGATIVE 01/25/2019 1315   LEUKOCYTESUR NEGATIVE 01/25/2019 1315    ----------------------------------------------------------------------------------------------------------------   Imaging Results:    Ct Abdomen Pelvis W Contrast  Result Date: 01/25/2019 CLINICAL DATA:  Severe abdominal pain. EXAM: CT ABDOMEN AND PELVIS WITH CONTRAST TECHNIQUE: Multidetector CT imaging of the abdomen and pelvis was performed using the standard protocol following bolus administration of intravenous contrast. CONTRAST:  171mL OMNIPAQUE IOHEXOL 300 MG/ML  SOLN COMPARISON:  01/26/2018 CT abdomen/pelvis. FINDINGS: Lower chest: No significant pulmonary nodules or acute consolidative airspace disease. Hepatobiliary: Normal liver size. Subcentimeter hypodense segment 2 left liver lobe lesion, too small to characterize, unchanged, considered benign. No new liver lesions. Normal gallbladder with no radiopaque cholelithiasis. No biliary ductal dilatation. Pancreas: Normal, with no mass or duct dilation. Spleen: Normal size. No mass. Adrenals/Urinary Tract: Normal adrenals. No hydronephrosis. Subcentimeter hypodense renal cortical lesion in interpolar right kidney is too small to characterize and requires no follow-up. No additional renal lesions. Normal bladder.  Stomach/Bowel: Normal non-distended stomach. Normal caliber small bowel with no small bowel wall thickening. Normal appendix. Moderate sigmoid diverticulosis. There is segmental moderate wall thickening with prominent surrounding fat stranding in mid sigmoid colon, compatible with acute sigmoid diverticulitis. A few tiny foci of free air indicate localized perforation. No pericolonic abscess. Vascular/Lymphatic: Atherosclerotic nonaneurysmal abdominal aorta. Patent portal, splenic, hepatic and renal veins. No pathologically enlarged lymph nodes in the abdomen or pelvis. Reproductive: Top-normal size prostate. Nonspecific tiny internal prostatic calcifications. Other: No ascites. Musculoskeletal: No aggressive appearing focal osseous lesions. Mild thoracolumbar spondylosis. IMPRESSION: 1. Acute sigmoid diverticulitis with tiny foci of pericolonic free air indicating perforation. No discrete abscess. 2.  Aortic Atherosclerosis (ICD10-I70.0). Electronically Signed   By: Ilona Sorrel M.D.   On: 01/25/2019 16:10    My personal review of EKG: Rhythm NSR, Rate  63 /min, QTc 415 , no Acute ST changes   Assessment & Plan:    Active Problems:   GERD (gastroesophageal reflux disease)   Hypotestosteronism   Essential hypertension, benign   ADHD (attention deficit hyperactivity disorder)   Alcohol consumption heavy   Acute diverticulitis  Acute Diverticultis with microperforation - patient presents with Abd pain, mild leukocytosis, he is afebrile, CT abdomen pelvis showing evidence of acute diverticulitis with microperforation, for now we will keep on clear liquid diet, will continue with IV Cipro and Flagyl, as needed pain meds, nausea medication, and as needed stool softener as well. -Consulted regarding further recommendation.  Alcohol use -She drinks 3 nights per week on average, keep on CIWA protocol.  Hypothyroidism -Continue with supplement as an outpatient.  DVT Prophylaxis  Lovenox - SCDs   AM  Labs Ordered, also please review Full Orders  Family Communication: Admission, patients condition and plan of care including tests being ordered have been discussed with the patient  who indicate understanding and agree with the plan and Code Status.  Code Status Full  Likely DC to Home  Condition GUARDED   Consults called: GI Consult requested in epic  Admission status: Observation  Time spent in minutes : 50 minutes   Huey Bienenstock M.D on 01/25/2019 at 8:34 PM  Between 7am to 7pm - Pager - [phone]. After 7pm go to [email or web address] - password [facility]  [facility] - Office  [phone]

## 2019-01-25 NOTE — ED Triage Notes (Signed)
Patient complains of severe abdominal pain. Patient was sent here by his primary doctor to rule out diverticulitis.

## 2019-01-25 NOTE — Progress Notes (Signed)
   Subjective:  Audio plus video  Patient ID: Nicholas Sharp, male    DOB: Dec 10, 1965, 53 y.o.   MRN: [id number]  Abdominal Pain This is a recurrent problem. The current episode started 1 to 4 weeks ago. The pain is located in the LLQ and generalized abdominal region. The quality of the pain is sharp. Associated symptoms comments: Fever that broke overnight, turning to left or right in bed hurts, laying flat he is fine, pt does feel achy but feels better that yesterday. He has tried acetaminophen (prednisone) for the symptoms.   Pt is eating mild and light (crackers, ginger ale, soup)  Virtual Visit via Video Note  I connected with Nicholas Sharp on 01/25/19 at  1:10 PM EDT by a video enabled telemedicine application and verified that I am speaking with the correct person using two identifiers.  Location: Patient: home Provider: office   I discussed the limitations of evaluation and management by telemedicine and the availability of in person appointments. The patient expressed understanding and agreed to proceed.  History of Present Illness:    Observations/Objective:   Assessment and Plan:   Follow Up Instructions:    I discussed the assessment and treatment plan with the patient. The patient was provided an opportunity to ask questions and all were answered. The patient agreed with the plan and demonstrated an understanding of the instructions.   The patient was advised to call back or seek an in-person evaluation if the symptoms worsen or if the condition fails to improve as anticipated.  I provided 25 minutes of non-face-to-face time during this encounter. Patient connects again with ongoing challenges.  Fever broke last night.  Minute reemerges morning.  Appetite is diminished.  No nausea.  No vomiting  Notes ongoing low abdominal pain.  Was primarily in the left lower quadrant.  Has been coming on for about a week.  Now seems to be deep in the pelvis.  No major  change in bowel habits  Noting aching fever and chills at times  Was not sure about his colonoscopy we did a research in presence of patient.  Indeed his colonoscopy revealed substantial left-sided diverticulosis  Vicente Males, LPN   Review of Systems  Gastrointestinal: Positive for abdominal pain.       Objective:   Physical Exam  Virtual      Assessment & Plan:  Impression progressive abdominal pain lower abdominal left lower abdominal.  Positive fever.  Positive chills positive aching.  Diminished energy.  Diverticulosis via colonoscopy.  Concern regarding diverticulitis.  Other considerations would be prostatitis based on symptomatology needs emergent work-up rationale discussed.  Multiple questions answered  Emergency room doctor call patient will proceed to ER for further work-up

## 2019-01-26 ENCOUNTER — Telehealth: Payer: Self-pay | Admitting: Family Medicine

## 2019-01-26 ENCOUNTER — Encounter: Payer: Self-pay | Admitting: Family Medicine

## 2019-01-26 ENCOUNTER — Encounter (HOSPITAL_COMMUNITY): Payer: Self-pay | Admitting: Gastroenterology

## 2019-01-26 DIAGNOSIS — K5792 Diverticulitis of intestine, part unspecified, without perforation or abscess without bleeding: Secondary | ICD-10-CM | POA: Diagnosis not present

## 2019-01-26 DIAGNOSIS — K5720 Diverticulitis of large intestine with perforation and abscess without bleeding: Secondary | ICD-10-CM | POA: Diagnosis present

## 2019-01-26 DIAGNOSIS — I10 Essential (primary) hypertension: Secondary | ICD-10-CM | POA: Diagnosis present

## 2019-01-26 DIAGNOSIS — E039 Hypothyroidism, unspecified: Secondary | ICD-10-CM | POA: Diagnosis present

## 2019-01-26 DIAGNOSIS — Z789 Other specified health status: Secondary | ICD-10-CM | POA: Diagnosis not present

## 2019-01-26 DIAGNOSIS — R1032 Left lower quadrant pain: Secondary | ICD-10-CM | POA: Diagnosis not present

## 2019-01-26 DIAGNOSIS — Z79891 Long term (current) use of opiate analgesic: Secondary | ICD-10-CM | POA: Diagnosis not present

## 2019-01-26 DIAGNOSIS — Z79899 Other long term (current) drug therapy: Secondary | ICD-10-CM | POA: Diagnosis not present

## 2019-01-26 DIAGNOSIS — E785 Hyperlipidemia, unspecified: Secondary | ICD-10-CM | POA: Diagnosis present

## 2019-01-26 DIAGNOSIS — Z20828 Contact with and (suspected) exposure to other viral communicable diseases: Secondary | ICD-10-CM | POA: Diagnosis present

## 2019-01-26 DIAGNOSIS — Z8249 Family history of ischemic heart disease and other diseases of the circulatory system: Secondary | ICD-10-CM | POA: Diagnosis not present

## 2019-01-26 DIAGNOSIS — K219 Gastro-esophageal reflux disease without esophagitis: Secondary | ICD-10-CM | POA: Diagnosis present

## 2019-01-26 DIAGNOSIS — Z833 Family history of diabetes mellitus: Secondary | ICD-10-CM | POA: Diagnosis not present

## 2019-01-26 DIAGNOSIS — Z7952 Long term (current) use of systemic steroids: Secondary | ICD-10-CM | POA: Diagnosis not present

## 2019-01-26 DIAGNOSIS — F909 Attention-deficit hyperactivity disorder, unspecified type: Secondary | ICD-10-CM | POA: Diagnosis present

## 2019-01-26 LAB — CBC
HCT: 54.7 % — ABNORMAL HIGH (ref 39.0–52.0)
Hemoglobin: 17.3 g/dL — ABNORMAL HIGH (ref 13.0–17.0)
MCH: 28.9 pg (ref 26.0–34.0)
MCHC: 31.6 g/dL (ref 30.0–36.0)
MCV: 91.5 fL (ref 80.0–100.0)
Platelets: 198 10*3/uL (ref 150–400)
RBC: 5.98 MIL/uL — ABNORMAL HIGH (ref 4.22–5.81)
RDW: 13.9 % (ref 11.5–15.5)
WBC: 10 10*3/uL (ref 4.0–10.5)
nRBC: 0 % (ref 0.0–0.2)

## 2019-01-26 LAB — BASIC METABOLIC PANEL
Anion gap: 10 (ref 5–15)
BUN: 15 mg/dL (ref 6–20)
CO2: 24 mmol/L (ref 22–32)
Calcium: 8.4 mg/dL — ABNORMAL LOW (ref 8.9–10.3)
Chloride: 103 mmol/L (ref 98–111)
Creatinine, Ser: 0.8 mg/dL (ref 0.61–1.24)
GFR calc Af Amer: >60 mL/min (ref >60)
GFR calc non Af Amer: >60 mL/min (ref >60)
Glucose, Bld: 91 mg/dL (ref 70–99)
Potassium: 3.5 mmol/L (ref 3.5–5.1)
Sodium: 137 mmol/L (ref 135–145)

## 2019-01-26 LAB — HIV ANTIBODY (ROUTINE TESTING W REFLEX): HIV Screen 4th Generation wRfx: NONREACTIVE

## 2019-01-26 MED ORDER — HYDROMORPHONE HCL 1 MG/ML IJ SOLN
0.5000 mg | INTRAMUSCULAR | Status: DC | PRN
  Administered 2019-01-26 (×2): 0.5 mg via INTRAVENOUS
  Filled 2019-01-26 (×3): qty 0.5

## 2019-01-26 MED ORDER — ZOLPIDEM TARTRATE 5 MG PO TABS: 5.0000 mg | ORAL_TABLET | Freq: Every evening | ORAL | Status: DC | PRN

## 2019-01-26 NOTE — Telephone Encounter (Signed)
Pt wanted to let Dr. Richardson Landry know that "he got it right" & pt is currently inpatient at [facility] with diverticulitis

## 2019-01-26 NOTE — Consult Note (Signed)
[facility] Consult  Reason for Consult: Diverticulitis with microperforation Referring Physician: Dr. Gwenlyn Perking   Chief Complaint    Abdominal Pain      HPI: Nicholas Sharp is a 53 y.o. male with a history of 2 weeks of lower abdominal pain on the left that he confused with some back pain that he has had chronically. He thought that he had pulled something getting in and out of bed with his back, and says that he was taking percocet and this masked his abdominal pain. He says he started having some constipation and also some fevers, and he spoke with Dr. Jena Gauss who is a family friend, and decided to come to the ED for evaluation.  He was worked up and found to have signs of diverticulitis and and microperforation but no fluid collection.  He has been on antibiotics and is on clear liquid diet. He had associated anorexia but no nausea/vomiting. He had a colonoscopy in 2018 that demonstrated some diverticulosis but no polyps.   Past Medical History:  Diagnosis Date  . ADHD (attention deficit hyperactivity disorder)   . Alcohol consumption heavy   . DDD (degenerative disc disease)   . ED (erectile dysfunction)   . GERD (gastroesophageal reflux disease)   . Hyperlipidemia    Diet controlled  . Insomnia    Previously    Past Surgical History:  Procedure Laterality Date  . COLONOSCOPY  10/2016   left sided diverticulosis  . ESOPHAGOGASTRODUODENOSCOPY     Dr Magod-hiatal hernia, normal esophagus & esophageal bx, gastritis (no bx), normal D1/D2  . ESOPHAGOGASTRODUODENOSCOPY  02/17/2012   Mild reflux esophagitis, small hh  . KNEE SURGERY     x 2   right  . SHOULDER SURGERY     x 2  left    Family History  Problem Relation Age of Onset  . Diabetes Mother   . Dementia Mother   . Congestive Heart Failure Mother   . Irritable bowel syndrome Mother   . Diverticulosis Mother   . Hypertension Father   . Depression Sister   . Diverticulitis Sister   . Colon cancer  Neg Hx   . Esophageal cancer Neg Hx   . Rectal cancer Neg Hx   . Stomach cancer Neg Hx   . Pancreatic cancer Neg Hx   . Prostate cancer Neg Hx     Social History   Tobacco Use  . Smoking status: Never Smoker  . Smokeless tobacco: Never Used  Substance Use Topics  . Alcohol use: Yes    Alcohol/week: 15.0 standard drinks    Types: 15 Cans of beer per week    Comment: several beers/wk  . Drug use: No    Medications:  I have reviewed the patient's current medications. Prior to Admission:  Facility-Administered Medications Prior to Admission  Medication Dose Route Frequency Provider Last Rate Last Dose  . 0.9 %  sodium chloride infusion  500 mL Intravenous Continuous Charlie Pitter III, MD       Medications Prior to Admission  Medication Sig Dispense Refill Last Dose  . acetaminophen (TYLENOL) 500 MG tablet Take 500-1,000 mg by mouth every 6 (six) hours as needed for mild pain or moderate pain.   01/25/2019 at 1300  . ALPRAZolam (XANAX) 1 MG tablet Take one tablet po prn TID for anxiety ; use sparingly, must last one month (Patient taking differently: Take 1 mg by mouth at bedtime. *May take one tablet up to three times daily  as needed for anxiety*) 30 tablet 2 01/24/2019 at Unknown time  . Ascorbic Acid (VITAMIN C) 1000 MG tablet Take 1,000 mg by mouth daily.   01/25/2019 at Unknown time  . oxyCODONE-acetaminophen (PERCOCET/ROXICET) 5-325 MG tablet Take 1 tablet by mouth every 6 (six) hours as needed for moderate pain.    Past Month at Unknown time  . predniSONE (STERAPRED UNI-PAK 21 TAB) 10 MG (21) TBPK tablet Take 10 mg by mouth See admin instructions. 6 day course starting on 01/20/2019. (6,5,4,3,2,1)   01/25/2019 at Unknown time  . testosterone cypionate (DEPOTESTOSTERONE CYPIONATE) 200 MG/ML injection Inject 100 mg into the muscle every Friday.    Past Week at Unknown time  . tiZANidine (ZANAFLEX) 4 MG tablet Take 1 tablet (4 mg total) by mouth every 8 (eight) hours as needed for  muscle spasms. 30 tablet 0 UNKNOWN  . hydrOXYzine (ATARAX/VISTARIL) 50 MG tablet TAKE 1 TABLET BY MOUTH TWICE DAILY MAY CAUSE DROWSINESS (Patient not taking: Reported on 01/25/2019) 40 tablet 0 Not Taking at Unknown time  . metoprolol tartrate (LOPRESSOR) 50 MG tablet Take within two hours of the test (Patient not taking: Reported on 01/25/2019) 1 tablet 0 Not Taking at Unknown time  . oxyCODONE-acetaminophen (PERCOCET) 7.5-325 MG tablet Take one tablet po every 6 hrs prn pain (Patient not taking: Reported on 01/25/2019) 24 tablet 0 Not Taking at Unknown time   Scheduled: . enoxaparin (LOVENOX) injection  40 mg Subcutaneous Q24H  . folic acid  1 mg Oral Daily  . multivitamin with minerals  1 tablet Oral Daily  . sodium chloride flush  3 mL Intravenous Once  . thiamine  100 mg Oral Daily   Or  . thiamine  100 mg Intravenous Daily   Continuous: . sodium chloride 75 mL/hr at 01/25/19 2152  . ciprofloxacin 400 mg (01/26/19 1114)  . metronidazole 500 mg (01/26/19 0928)   ITG:PQDIYMEBRAXEN, ALPRAZolam, HYDROmorphone (DILAUDID) injection, LORazepam **OR** LORazepam, ondansetron **OR** ondansetron (ZOFRAN) IV, oxyCODONE-acetaminophen, senna-docusate  Allergies  Allergen Reactions  . Hydrocodone     Felt really weird and strange     ROS:  A comprehensive review of systems was negative except for: Constitutional: positive for anorexia and fevers Gastrointestinal: positive for abdominal pain and constipation  Blood pressure 120/86, pulse 79, temperature 98 F (36.7 C), temperature source Oral, resp. rate 18, height 6\' 2"  (1.88 m), weight 104.8 kg, SpO2 95 %. Physical Exam Vitals signs reviewed.  Constitutional:      Appearance: He is well-developed.  HENT:     Head: Normocephalic and atraumatic.  Eyes:     Extraocular Movements: Extraocular movements intact.     Pupils: Pupils are equal, round, and reactive to light.  Cardiovascular:     Rate and Rhythm: Normal rate and regular  rhythm.  Pulmonary:     Effort: Pulmonary effort is normal.     Breath sounds: Normal breath sounds.  Abdominal:     General: There is no distension.     Palpations: Abdomen is soft.     Tenderness: There is abdominal tenderness in the left lower quadrant. There is no guarding or rebound.  Musculoskeletal: Normal range of motion.     Comments: No edema, moves all extremities   Skin:    General: Skin is warm and dry.  Neurological:     General: No focal deficit present.     Mental Status: He is alert and oriented to person, place, and time.  Psychiatric:  Mood and Affect: Mood normal.        Behavior: Behavior normal.     Results: Results for orders placed or performed during the hospital encounter of 01/25/19 (from the past 48 hour(s))  Urinalysis, Routine w reflex microscopic     Status: Abnormal   Collection Time: 01/25/19  1:15 PM  Result Value Ref Range   Color, Urine YELLOW YELLOW   APPearance HAZY (A) CLEAR   Specific Gravity, Urine 1.008 1.005 - 1.030   pH 7.0 5.0 - 8.0   Glucose, UA NEGATIVE NEGATIVE mg/dL   Hgb urine dipstick SMALL (A) NEGATIVE   Bilirubin Urine NEGATIVE NEGATIVE   Ketones, ur NEGATIVE NEGATIVE mg/dL   Protein, ur NEGATIVE NEGATIVE mg/dL   Nitrite NEGATIVE NEGATIVE   Leukocytes,Ua NEGATIVE NEGATIVE   RBC / HPF 0-5 0 - 5 RBC/hpf   WBC, UA 0-5 0 - 5 WBC/hpf   Bacteria, UA NONE SEEN NONE SEEN    Comment: Performed at [facility], [address], [place], [place] [address]  SARS Coronavirus 2 by RT PCR (hospital order, performed in [facility] hospital lab) Nasopharyngeal Nasopharyngeal Swab     Status: None   Collection Time: 01/25/19  1:40 PM   Specimen: Nasopharyngeal Swab  Result Value Ref Range   SARS Coronavirus 2 NEGATIVE NEGATIVE    Comment: (NOTE) If result is NEGATIVE SARS-CoV-2 target nucleic acids are NOT DETECTED. The SARS-CoV-2 RNA is generally detectable in upper and lower  respiratory specimens during the acute phase of  infection. The lowest  concentration of SARS-CoV-2 viral copies this assay can detect is 250  copies / mL. A negative result does not preclude SARS-CoV-2 infection  and should not be used as the sole basis for treatment or other  patient management decisions.  A negative result may occur with  improper specimen collection / handling, submission of specimen other  than nasopharyngeal swab, presence of viral mutation(s) within the  areas targeted by this assay, and inadequate number of viral copies  (<250 copies / mL). A negative result must be combined with clinical  observations, patient history, and epidemiological information. If result is POSITIVE SARS-CoV-2 target nucleic acids are DETECTED. The SARS-CoV-2 RNA is generally detectable in upper and lower  respiratory specimens dur ing the acute phase of infection.  Positive  results are indicative of active infection with SARS-CoV-2.  Clinical  correlation with patient history and other diagnostic information is  necessary to determine patient infection status.  Positive results do  not rule out bacterial infection or co-infection with other viruses. If result is PRESUMPTIVE POSTIVE SARS-CoV-2 nucleic acids MAY BE PRESENT.   A presumptive positive result was obtained on the submitted specimen  and confirmed on repeat testing.  While 2019 novel coronavirus  (SARS-CoV-2) nucleic acids may be present in the submitted sample  additional confirmatory testing may be necessary for epidemiological  and / or clinical management purposes  to differentiate between  SARS-CoV-2 and other Sarbecovirus currently known to infect humans.  If clinically indicated additional testing with an alternate test  methodology [id number]) is advised. The SARS-CoV-2 RNA is generally  detectable in upper and lower respiratory sp ecimens during the acute  phase of infection. The expected result is Negative. Fact Sheet for Patients:   BoilerBrush.com.cy Fact Sheet for Healthcare Providers: https://pope.com/ This test is not yet approved or cleared by the [place] FDA and has been authorized for detection and/or diagnosis of SARS-CoV-2 by FDA under an Emergency Use Authorization (EUA).  This EUA will remain in effect (meaning this test can be used) for the duration of the COVID-19 declaration under Section 564(b)(1) of the Act, 21 U.S.C. section 360bbb-3(b)(1), unless the authorization is terminated or revoked sooner. Performed at [facility], [address], [place], [place] [address]   Lipase, blood     Status: None   Collection Time: 01/25/19  2:00 PM  Result Value Ref Range   Lipase 29 11 - 51 U/L    Comment: Performed at [facility], [address] [place], [place] [address]  Comprehensive metabolic panel     Status: Abnormal   Collection Time: 01/25/19  2:00 PM  Result Value Ref Range   Sodium 136 135 - 145 mmol/L   Potassium 3.8 3.5 - 5.1 mmol/L   Chloride 100 98 - 111 mmol/L   CO2 27 22 - 32 mmol/L   Glucose, Bld 116 (H) 70 - 99 mg/dL   BUN 15 6 - 20 mg/dL   Creatinine, Ser 0.70 0.61 - 1.24 mg/dL   Calcium 8.6 (L) 8.9 - 10.3 mg/dL   Total Protein 7.2 6.5 - 8.1 g/dL   Albumin 3.7 3.5 - 5.0 g/dL   AST 26 15 - 41 U/L   ALT 57 (H) 0 - 44 U/L   Alkaline Phosphatase 77 38 - 126 U/L   Total Bilirubin 1.0 0.3 - 1.2 mg/dL   GFR calc non Af Amer >60 >60 mL/min   GFR calc Af Amer >60 >60 mL/min   Anion gap 9 5 - 15    Comment: Performed at [facility], [address], [place], [place] [address]  CBC     Status: Abnormal   Collection Time: 01/25/19  2:00 PM  Result Value Ref Range   WBC 12.8 (H) 4.0 - 10.5 K/uL   RBC 6.03 (H) 4.22 - 5.81 MIL/uL   Hemoglobin 17.4 (H) 13.0 - 17.0 g/dL   HCT 55.7 (H) 39.0 - 52.0 %   MCV 92.4 80.0 - 100.0 fL   MCH 28.9 26.0 - 34.0 pg   MCHC 31.2 30.0 - 36.0 g/dL   RDW 14.4 11.5 - 15.5 %   Platelets 216 150 - 400 K/uL    nRBC 0.0 0.0 - 0.2 %    Comment: Performed at [facility], [address], [place], [place] [address]  Basic metabolic panel     Status: Abnormal   Collection Time: 01/26/19  6:00 AM  Result Value Ref Range   Sodium 137 135 - 145 mmol/L   Potassium 3.5 3.5 - 5.1 mmol/L   Chloride 103 98 - 111 mmol/L   CO2 24 22 - 32 mmol/L   Glucose, Bld 91 70 - 99 mg/dL   BUN 15 6 - 20 mg/dL   Creatinine, Ser 0.80 0.61 - 1.24 mg/dL   Calcium 8.4 (L) 8.9 - 10.3 mg/dL   GFR calc non Af Amer >60 >60 mL/min   GFR calc Af Amer >60 >60 mL/min   Anion gap 10 5 - 15    Comment: Performed at [facility], [address], [place], [place] [address]  CBC     Status: Abnormal   Collection Time: 01/26/19  6:00 AM  Result Value Ref Range   WBC 10.0 4.0 - 10.5 K/uL   RBC 5.98 (H) 4.22 - 5.81 MIL/uL   Hemoglobin 17.3 (H) 13.0 - 17.0 g/dL   HCT 54.7 (H) 39.0 - 52.0 %   MCV 91.5 80.0 - 100.0 fL   MCH 28.9  26.0 - 34.0 pg   MCHC 31.6 30.0 - 36.0 g/dL   RDW 81.113.9 91.411.5 - 78.215.5 %   Platelets 198 150 - 400 K/uL   nRBC 0.0 0.0 - 0.2 %    Comment: Performed at [facility], [address] [place], [place] [address]   Personally reviewed- thickened sigmoid colon and small foci of air but no fluid, consistent with microperforation   Ct Abdomen Pelvis W Contrast  Result Date: 01/25/2019 CLINICAL DATA:  Severe abdominal pain. EXAM: CT ABDOMEN AND PELVIS WITH CONTRAST TECHNIQUE: Multidetector CT imaging of the abdomen and pelvis was performed using the standard protocol following bolus administration of intravenous contrast. CONTRAST:  100mL OMNIPAQUE IOHEXOL 300 MG/ML  SOLN COMPARISON:  01/26/2018 CT abdomen/pelvis. FINDINGS: Lower chest: No significant pulmonary nodules or acute consolidative airspace disease. Hepatobiliary: Normal liver size. Subcentimeter hypodense segment 2 left liver lobe lesion, too small to characterize, unchanged, considered benign. No new liver lesions. Normal gallbladder with no radiopaque  cholelithiasis. No biliary ductal dilatation. Pancreas: Normal, with no mass or duct dilation. Spleen: Normal size. No mass. Adrenals/Urinary Tract: Normal adrenals. No hydronephrosis. Subcentimeter hypodense renal cortical lesion in interpolar right kidney is too small to characterize and requires no follow-up. No additional renal lesions. Normal bladder. Stomach/Bowel: Normal non-distended stomach. Normal caliber small bowel with no small bowel wall thickening. Normal appendix. Moderate sigmoid diverticulosis. There is segmental moderate wall thickening with prominent surrounding fat stranding in mid sigmoid colon, compatible with acute sigmoid diverticulitis. A few tiny foci of free air indicate localized perforation. No pericolonic abscess. Vascular/Lymphatic: Atherosclerotic nonaneurysmal abdominal aorta. Patent portal, splenic, hepatic and renal veins. No pathologically enlarged lymph nodes in the abdomen or pelvis. Reproductive: Top-normal size prostate. Nonspecific tiny internal prostatic calcifications. Other: No ascites. Musculoskeletal: No aggressive appearing focal osseous lesions. Mild thoracolumbar spondylosis. IMPRESSION: 1. Acute sigmoid diverticulitis with tiny foci of pericolonic free air indicating perforation. No discrete abscess. 2.  Aortic Atherosclerosis (ICD10-I70.0). Electronically Signed   By: Delbert PhenixJason A Poff M.D.   On: 01/25/2019 16:10    Assessment & Plan:  Nicholas Sharp is a 53 y.o. male with diverticulitis and microperforation. He is doing well and is on clear diet. He is having pain control with oral medication.   We have discussed that diverticulitis is a spectrum of disease. We discussed that it ranges from simple diverticulitis that can be treated with oral antibiotics as an outpatient to severe cases with perforations that require emergency surgery and colostomy.  We discussed that there case is more complicated in nature.  We have discussed that some of the complicated  cases require admission to the hospital for IV antibiotics and some require Interventional Radiology drainage of an abscess.  We discussed that he does not have an abscess at this time.   We have discussed that during the acute inflammation and infection period that it is more likely to need an colostomy due to the risk of the colon anastomosis not healing and result in a leak.  We discussed that we do not always have to do surgery, and that we talk with each patient and have an individualized approach.   Advance diet as tolerated IV Antibiotics, would do Augmentin at D.c to complete 14 day course  Can follow up with me in 4 weeks to see how he is doing   All questions were answered to the satisfaction of the patient.   Lucretia RoersLindsay C Cledith Kamiya 01/26/2019, 11:54 AM

## 2019-01-26 NOTE — Consult Note (Addendum)
Referring Provider:Vassie Lollrlos, MD Primary Care Physician:  Merlyn Albert, MD Primary Gastroenterologist:  Dr. Amada Jupiter  Reason for Consultation: Diverticulitis with microperforation  HPI: Nicholas Sharp is a 53 y.o. male with past medical history of GERD, hyperlipidemia presented to the emergency department with left lower quadrant pain with radiation into the rectum.  Patient states that in retrospect he probably has been having some left lower quadrant pain for several weeks.  He has been dealing with back pain from an injury for several weeks and initially thought his left lower quadrant pain was related to strain from his back.  Over the past several days his symptoms have been worsening.  He has had some chills and subjective fevers. Complains of poor appetite.  Yesterday with a bowel movement his stool was tinged pinkish-red.  He has had some issues with fluctuating constipation and diarrhea in the setting of recent narcotic use and MiraLAX to compensate for opioid-induced constipation.  Denies any upper GI symptoms.  In the ED he was noted to have a mild leukocytosis of 12,800, hemoglobin 17.4, creatinine 0.7, ALT 57.  CT abdomen pelvis with contrast showed subcentimeter hypodense segment to left liver lobe lesion, too small to characterize. Moderate sigmoid diverticulosis.  There is segmental moderate wall thickening with prominent surrounding fat stranding in mid sigmoid colon, compatible with acute sigmoid diverticulitis.  A few tiny foci of free air indicate localized perforation.  No pericolonic abscess.  Patient has been maintained on a clear liquid diet.  He is receiving IV Cipro and IV Flagyl.  This morning his white blood cell count is down to 10,000.  His pain is manageable.  Tolerating clear liquids.  Last colonoscopy in 2018 by Dr. Myrtie Neither, left-sided diverticulosis.   Prior to Admission medications   Medication Sig Start Date End Date Taking? Authorizing Provider   acetaminophen (TYLENOL) 500 MG tablet Take 500-1,000 mg by mouth every 6 (six) hours as needed for mild pain or moderate pain.   Yes [provider]  ALPRAZolam Prudy Feeler) 1 MG tablet Take one tablet po prn TID for anxiety ; use sparingly, must last one month Patient taking differently: Take 1 mg by mouth at bedtime. *May take one tablet up to three times daily as needed for anxiety* 01/17/19  Yes Merlyn Albert, MD  Ascorbic Acid (VITAMIN C) 1000 MG tablet Take 1,000 mg by mouth daily.   Yes [provider]  oxyCODONE-acetaminophen (PERCOCET/ROXICET) 5-325 MG tablet Take 1 tablet by mouth every 6 (six) hours as needed for moderate pain.  01/17/19  Yes [provider]  predniSONE (STERAPRED UNI-PAK 21 TAB) 10 MG (21) TBPK tablet Take 10 mg by mouth See admin instructions. 6 day course starting on 01/20/2019. (6,5,4,3,2,1) 01/20/19  Yes [provider]  testosterone cypionate (DEPOTESTOSTERONE CYPIONATE) 200 MG/ML injection Inject 100 mg into the muscle every Friday.  01/03/19  Yes [provider]  tiZANidine (ZANAFLEX) 4 MG tablet Take 1 tablet (4 mg total) by mouth every 8 (eight) hours as needed for muscle spasms. 01/10/19  Yes Merlyn Albert, MD  hydrOXYzine (ATARAX/VISTARIL) 50 MG tablet TAKE 1 TABLET BY MOUTH TWICE DAILY MAY CAUSE DROWSINESS Patient not taking: Reported on 01/25/2019 06/15/18   Merlyn Albert, MD  metoprolol tartrate (LOPRESSOR) 50 MG tablet Take within two hours of the test Patient not taking: Reported on 01/25/2019 11/04/18   Croitoru, Mihai, MD  oxyCODONE-acetaminophen (PERCOCET) 7.5-325 MG tablet Take one tablet po every 6 hrs prn pain  Patient not taking: Reported on 01/25/2019 01/11/19   Mikey Kirschner, MD    Current Facility-Administered Medications  Medication Dose Route Frequency Provider Last Rate Last Dose  . 0.9 %  sodium chloride infusion   Intravenous Continuous Elgergawy, Silver Huguenin, MD 75 mL/hr at 01/25/19 2152     . acetaminophen (TYLENOL) tablet 650 mg  650 mg Oral Q6H PRN Elgergawy, Silver Huguenin, MD   650 mg at 01/26/19 0243  . ALPRAZolam Duanne Moron) tablet 0.25 mg  0.25 mg Oral TID PRN Elgergawy, Silver Huguenin, MD   0.25 mg at 01/25/19 2152  . ciprofloxacin (CIPRO) IVPB 400 mg  400 mg Intravenous Q12H Elgergawy, Dawood S, MD      . enoxaparin (LOVENOX) injection 40 mg  40 mg Subcutaneous Q24H Elgergawy, Silver Huguenin, MD      . folic acid (FOLVITE) tablet 1 mg  1 mg Oral Daily Elgergawy, Silver Huguenin, MD   1 mg at 01/25/19 2152  . HYDROmorphone (DILAUDID) injection 0.5 mg  0.5 mg Intravenous Q4H PRN Jani Gravel, MD   0.5 mg at 01/26/19 0530  . LORazepam (ATIVAN) tablet 1-4 mg  1-4 mg Oral Q1H PRN Elgergawy, Silver Huguenin, MD       Or  . LORazepam (ATIVAN) injection 1-4 mg  1-4 mg Intravenous Q1H PRN Elgergawy, Silver Huguenin, MD      . metroNIDAZOLE (FLAGYL) IVPB 500 mg  500 mg Intravenous Q8H Elgergawy, Silver Huguenin, MD 100 mL/hr at 01/26/19 0243 500 mg at 01/26/19 0243  . multivitamin with minerals tablet 1 tablet  1 tablet Oral Daily Elgergawy, Silver Huguenin, MD   1 tablet at 01/25/19 2152  . ondansetron (ZOFRAN) tablet 4 mg  4 mg Oral Q6H PRN Elgergawy, Silver Huguenin, MD       Or  . ondansetron (ZOFRAN) injection 4 mg  4 mg Intravenous Q6H PRN Elgergawy, Silver Huguenin, MD      . oxyCODONE-acetaminophen (PERCOCET/ROXICET) 5-325 MG per tablet 1 tablet  1 tablet Oral Q6H PRN Elgergawy, Silver Huguenin, MD      . senna-docusate (Senokot-S) tablet 1 tablet  1 tablet Oral QHS PRN Elgergawy, Silver Huguenin, MD      . sodium chloride flush (NS) 0.9 % injection 3 mL  3 mL Intravenous Once Elgergawy, Silver Huguenin, MD      . thiamine (VITAMIN B-1) tablet 100 mg  100 mg Oral Daily Elgergawy, Silver Huguenin, MD   100 mg at 01/25/19 2152   Or  . thiamine (B-1) injection 100 mg  100 mg Intravenous Daily Elgergawy, Silver Huguenin, MD        Allergies as of 01/25/2019 - Review Complete 01/25/2019  Allergen Reaction Noted  . Hydrocodone  01/10/2019    Past Medical History:  Diagnosis  Date  . ADHD (attention deficit hyperactivity disorder)   . Alcohol consumption heavy   . DDD (degenerative disc disease)   . ED (erectile dysfunction)   . GERD (gastroesophageal reflux disease)   . Hyperlipidemia    Diet controlled  . Insomnia    Previously    Past Surgical History:  Procedure Laterality Date  . COLONOSCOPY  10/2016   left sided diverticulosis  . ESOPHAGOGASTRODUODENOSCOPY     Dr Magod-hiatal hernia, normal esophagus & esophageal bx, gastritis (no bx), normal D1/D2  . ESOPHAGOGASTRODUODENOSCOPY  02/17/2012   Mild reflux esophagitis, small hh  . KNEE SURGERY     x 2   right  . SHOULDER SURGERY     x 2  left  Family History  Problem Relation Age of Onset  . Diabetes Mother   . Dementia Mother   . Congestive Heart Failure Mother   . Hypertension Father   . Depression Sister   . Colon cancer Neg Hx   . Esophageal cancer Neg Hx   . Rectal cancer Neg Hx   . Stomach cancer Neg Hx   . Pancreatic cancer Neg Hx   . Prostate cancer Neg Hx     Social History   Socioeconomic History  . Marital status: Single    Spouse name: Not on file  . Number of children: 1  . Years of education: Not on file  . Highest education level: Not on file  Occupational History  . 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 Financial resource strain: Not on file  . Food insecurity    Worry: Not on file    Inability: Not on file  . Transportation needs    Medical: Not on file    Non-medical: Not on file  Tobacco Use  . Smoking status: Never Smoker  . Smokeless tobacco: Never Used  Substance and Sexual Activity  . Alcohol use: Yes    Alcohol/week: 15.0 standard drinks    Types: 15 Cans of beer per week    Comment: several beers/wk  . Drug use: No  . Sexual activity: Not on file  Lifestyle  . Physical activity    Days per week: Not on file    Minutes per session: Not on file  . Stress: Not on file  Relationships  . Social  Musicianconnections    Talks on phone: Not on file    Gets together: Not on file    Attends religious service: Not on file    Active member of club or organization: Not on file    Attends meetings of clubs or organizations: Not on file    Relationship status: Not on file  . Intimate partner violence    Fear of current or ex partner: Not on file    Emotionally abused: Not on file    Physically abused: Not on file    Forced sexual activity: Not on file  Other Topics Concern  . Not on file  Social History Narrative   1 son-24 healthy   Lives alone     ROS:  General: Negative for anorexia, weight loss, fever, chills, fatigue, weakness. Eyes: Negative for vision changes.  ENT: Negative for hoarseness, difficulty swallowing , nasal congestion. CV: Negative for chest pain, angina, palpitations, dyspnea on exertion, peripheral edema.  Respiratory: Negative for dyspnea at rest, dyspnea on exertion, cough, sputum, wheezing.  GI: See history of present illness. GU:  Negative for dysuria, hematuria, urinary incontinence, urinary frequency, nocturnal urination.  MS: Negative for joint pain.  Positive low back pain.  Derm: Negative for rash or itching.  Neuro: Negative for weakness, abnormal sensation, seizure, frequent headaches, memory loss, confusion.  Psych: Negative for anxiety, depression, suicidal ideation, hallucinations.  Endo: Negative for unusual weight change.  Heme: Negative for bruising or bleeding. Allergy: Negative for rash or hives.       Physical Examination: Vital signs in last 24 hours: Temp:  [98 F (36.7 C)-99 F (37.2 C)] 98 F (36.7 C) (10/29 0538) Pulse Rate:  [79-96] 79 (10/29 0538) Resp:  [16-18] 18 (10/29 0538) BP: (120-148)/(86-106) 120/86 (10/29 0538) SpO2:  [95 %-98 %] 95 % (10/29 0538) Weight:  [102.1 kg-104.8 kg] 104.8 kg (10/28  2042) Last BM Date: 01/26/19  General: Well-nourished, well-developed in no acute distress.  Head: Normocephalic, atraumatic.    Eyes: Conjunctiva pink, no icterus. Mouth: Oropharyngeal mucosa moist and pink , no lesions erythema or exudate. Neck: Supple without thyromegaly, masses, or lymphadenopathy.  Lungs: Clear to auscultation bilaterally.  Heart: Regular rate and rhythm, no murmurs rubs or gallops.  Abdomen: Bowel sounds are normal, nondistended, no hepatosplenomegaly or masses, no abdominal bruits or    hernia , no rebound or guarding.  Moderate left lower quadrant tenderness Rectal: Not performed Extremities: No lower extremity edema, clubbing, deformity.  Neuro: Alert and oriented x 4 , grossly normal neurologically.  Skin: Warm and dry, no rash or jaundice.   Psych: Alert and cooperative, normal mood and affect.        Intake/Output from previous day: 10/28 0701 - 10/29 0700 In: 888.9 [I.V.:361.5; IV Piggyback:527.4] Out: -  Intake/Output this shift: No intake/output data recorded.  Lab Results: CBC Recent Labs    01/25/19 1400 01/26/19 0600  WBC 12.8* 10.0  HGB 17.4* 17.3*  HCT 55.7* 54.7*  MCV 92.4 91.5  PLT 216 198   BMET Recent Labs    01/25/19 1400 01/26/19 0600  NA 136 137  K 3.8 3.5  CL 100 103  CO2 27 24  GLUCOSE 116* 91  BUN 15 15  CREATININE 0.70 0.80  CALCIUM 8.6* 8.4*   LFT Recent Labs    01/25/19 1400  BILITOT 1.0  ALKPHOS 77  AST 26  ALT 57*  PROT 7.2  ALBUMIN 3.7    Lipase Recent Labs    01/25/19 1400  LIPASE 29    PT/INR No results for input(s): LABPROT, INR in the last 72 hours.    Imaging Studies: Ct Abdomen Pelvis W Contrast  Result Date: 01/25/2019 CLINICAL DATA:  Severe abdominal pain. EXAM: CT ABDOMEN AND PELVIS WITH CONTRAST TECHNIQUE: Multidetector CT imaging of the abdomen and pelvis was performed using the standard protocol following bolus administration of intravenous contrast. CONTRAST:  OMNIPAQUE IOHEXOL 300 MG/ML  SOLN COMPARISON:  01/26/2018 CT abdomen/pelvis. FINDINGS: Lower chest: No significant pulmonary nodules or acute  consolidative airspace disease. Hepatobiliary: Normal liver size. Subcentimeter hypodense segment 2 left liver lobe lesion, too small to characterize, unchanged, considered benign. No new liver lesions. Normal gallbladder with no radiopaque cholelithiasis. No biliary ductal dilatation. Pancreas: Normal, with no mass or duct dilation. Spleen: Normal size. No mass. Adrenals/Urinary Tract: Normal adrenals. No hydronephrosis. Subcentimeter hypodense renal cortical lesion in interpolar right kidney is too small to characterize and requires no follow-up. No additional renal lesions. Normal bladder. Stomach/Bowel: Normal non-distended stomach. Normal caliber small bowel with no small bowel wall thickening. Normal appendix. Moderate sigmoid diverticulosis. There is segmental moderate wall thickening with prominent surrounding fat stranding in mid sigmoid colon, compatible with acute sigmoid diverticulitis. A few tiny foci of free air indicate localized perforation. No pericolonic abscess. Vascular/Lymphatic: Atherosclerotic nonaneurysmal abdominal aorta. Patent portal, splenic, hepatic and renal veins. No pathologically enlarged lymph nodes in the abdomen or pelvis. Reproductive: Top-normal size prostate. Nonspecific tiny internal prostatic calcifications. Other: No ascites. Musculoskeletal: No aggressive appearing focal osseous lesions. Mild thoracolumbar spondylosis. IMPRESSION: 1. Acute sigmoid diverticulitis with tiny foci of pericolonic free air indicating perforation. No discrete abscess. 2.  Aortic Atherosclerosis (ICD10-I70.0). Electronically Signed   By: Delbert Phenix M.D.   On: 01/25/2019 16:10  [4 week]   Impression: Pleasant 53 year old gentleman presenting with progressive left lower quadrant pain, subjective fever and chills.  CT shows acute sigmoid diverticulitis with tiny foci of pericolonic free air indicating perforation.  No abscess seen.  This is patient's first episode of documented diverticulitis.   Colonoscopy in August 2018 with left-sided diverticulosis.  Leukocytosis resolved overnight.  He is on IV Cipro and IV Flagyl.  Tolerating clear liquids.  Elevated ALT: chronic finding. F/U with PCP or primary GI.  Plan: 1. Continue IV Cipro/IV Flagyl. 2. Continue clear liquid diet. 3. Supportive measures. 4. He will need to complete 14-day course of antibiotics after discharge. 5. Consider repeat imaging if clinical deterioration to rule out abscess formation. Otherwise consider follow-up CT in several weeks. 6. After resolution of diverticulitis, he will need high-fiber diet moving forward. 7. F/U with Dr. Myrtie Neither as outpatient.  We would like to thank you for the opportunity to participate in the care of [employer].  Leanna Battles. Dixon [facility]os [facility] [phone] 10/29/20203:47 PM    LOS: 0 days

## 2019-01-26 NOTE — Progress Notes (Addendum)
PROGRESS NOTE    Nicholas Sharp  EXB:[id number] DOB: 02-02-1966 DOA: 01/25/2019 PCP: Merlyn Albert, MD     Brief Narrative:  As per H&P written by Dr. [name] on 01/25/2019 53 y.o. male, with past medical history for GERD, hyperlipidemia, presents to ED secondary to complaints of abdominal pain, reports pain for last 48 hours, left lower abdomen, radiating to his rectum, denies any nausea, vomiting, diarrhea or constipation, no bright red blood per rectum or melena, denies fever, chills, cough, or COVID-19 exposure, patient reports history of diverticulosis diagnosed with a screening colonoscopy couple years ago in [place]. - in ED patient with mild leukocytosis at 12 K, T abdomen pelvis significant for acute diverticulitis with microperforation, started on Cipro and Flagyl and I was requested to admit/.  Assessment & Plan: 1-acute diverticulitis with microperforation -Abdominal pain slowly improving -No nausea or vomiting currently -Tolerating clear liquids -Continue IV antibiotics and as needed analgesics -Follow general surgery recommendations.  2-alcohol use -Continue folic acid, thiamine and CIWA protocol monitoring. -No active withdrawal symptoms appreciated.  3-hypotestosteronism  -Continue outpatient despotestosterone injection and further evaluation.   4-leukocytosis  -In the setting of #1 -continue IV antibiotics and IVF's -no fever   DVT prophylaxis: Lovenox Code Status: Full code Family Communication: No family at bedside Disposition Plan: Continue IV antibiotics, continue clear liquid diet, continue as needed analgesics.  Follow general surgery recommendations.  Patient improving appropriately.  Consultants:   Gastroenterology service  General surgery  Procedures:   See below for x-ray reports.  Antimicrobials:  Anti-infectives (From admission, onward)   Start     Dose/Rate Route Frequency Ordered Stop   01/26/19 0800  ciprofloxacin (CIPRO)  IVPB 400 mg     400 mg 200 mL/hr over 60 Minutes Intravenous Every 12 hours 01/25/19 1750     01/26/19 0200  metroNIDAZOLE (FLAGYL) IVPB 500 mg     500 mg 100 mL/hr over 60 Minutes Intravenous Every 8 hours 01/25/19 1750     01/25/19 1630  ciprofloxacin (CIPRO) IVPB 400 mg     400 mg 200 mL/hr over 60 Minutes Intravenous  Once 01/25/19 1623 01/25/19 1928   01/25/19 1630  metroNIDAZOLE (FLAGYL) IVPB 500 mg     500 mg 100 mL/hr over 60 Minutes Intravenous  Once 01/25/19 1623 01/25/19 1927       Subjective: Afebrile, no chest pain, no nausea, no vomiting.  Still having abdominal discomfort (left lower quadrant), but improved.  Objective: Vitals:   01/25/19 2042 01/25/19 2043 01/26/19 0538 01/26/19 1529  BP:  (!) 141/100 120/86 (!) 140/97  Pulse:  94 79 70  Resp:  16 18 20   Temp:  99 F (37.2 C) 98 F (36.7 C) 98.6 F (37 C)  TempSrc:  Oral Oral   SpO2:  97% 95% 97%  Weight: 104.8 kg     Height: 6\' 2"  (1.88 m)       Intake/Output Summary (Last 24 hours) at 01/26/2019 1627 Last data filed at 01/26/2019 0300 Gross per 24 hour  Intake 388.9 ml  Output --  Net 388.9 ml   Filed Weights   01/25/19 1311 01/25/19 2042  Weight: 102.1 kg 104.8 kg    Examination: General exam: Alert, awake, oriented x 3; no chest pain, no nausea, no vomiting.  Reports improvement in his left lower quadrant abdominal pain. Respiratory system: Clear to auscultation. Respiratory effort normal. Cardiovascular system:RRR. No murmurs, rubs, gallops. Gastrointestinal system: Abdomen is nondistended, soft and mildly tender to palpation of left  lower quadrant.  No guarding. Normal bowel sounds heard. Central nervous system: Alert and oriented. No focal neurological deficits. Extremities: No C/C/E, +pedal pulses Skin: No rashes, lesions or ulcers Psychiatry: Judgement and insight appear normal. Mood & affect appropriate.     Data Reviewed: I have personally reviewed following labs and imaging  studies  CBC: Recent Labs  Lab 01/25/19 1400 01/26/19 0600  WBC 12.8* 10.0  HGB 17.4* 17.3*  HCT 55.7* 54.7*  MCV 92.4 91.5  PLT 216 198   Basic Metabolic Panel: Recent Labs  Lab 01/25/19 1400 01/26/19 0600  NA 136 137  K 3.8 3.5  CL 100 103  CO2 27 24  GLUCOSE 116* 91  BUN 15 15  CREATININE 0.70 0.80  CALCIUM 8.6* 8.4*   GFR: Estimated Creatinine Clearance: 139.3 mL/min (by C-G formula based on SCr of 0.8 mg/dL).   Liver Function Tests: Recent Labs  Lab 01/25/19 1400  AST 26  ALT 57*  ALKPHOS 77  BILITOT 1.0  PROT 7.2  ALBUMIN 3.7   Recent Labs  Lab 01/25/19 1400  LIPASE 29   Urine analysis:    Component Value Date/Time   COLORURINE YELLOW 01/25/2019 1315   APPEARANCEUR HAZY (A) 01/25/2019 1315   LABSPEC 1.008 01/25/2019 1315   PHURINE 7.0 01/25/2019 1315   GLUCOSEU NEGATIVE 01/25/2019 1315   HGBUR SMALL (A) 01/25/2019 1315   BILIRUBINUR NEGATIVE 01/25/2019 1315   KETONESUR NEGATIVE 01/25/2019 1315   PROTEINUR NEGATIVE 01/25/2019 1315   UROBILINOGEN 0.2 08/22/2014 1053   NITRITE NEGATIVE 01/25/2019 1315   LEUKOCYTESUR NEGATIVE 01/25/2019 1315    Recent Results (from the past 240 hour(s))  SARS Coronavirus 2 by RT PCR (hospital order, [facility]) Nasopharyngeal Nasopharyngeal Swab     Status: None   Collection Time: 01/25/19  1:40 PM   Specimen: Nasopharyngeal Swab  Result Value Ref Range Status   SARS Coronavirus 2 NEGATIVE NEGATIVE Final    Comment: (NOTE) If result is NEGATIVE SARS-CoV-2 target nucleic acids are NOT DETECTED. The SARS-CoV-2 RNA is generally detectable in upper and lower  respiratory specimens during the acute phase of infection. The lowest  concentration of SARS-CoV-2 viral copies this assay can detect is 250  copies / mL. A negative result does not preclude SARS-CoV-2 infection  and should not be used as the sole basis for treatment or other  patient management decisions.  A negative result  may occur with  improper specimen collection / handling, submission of specimen other  than nasopharyngeal swab, presence of viral mutation(s) within the  areas targeted by this assay, and inadequate number of viral copies  (<250 copies / mL). A negative result must be combined with clinical  observations, patient history, and epidemiological information. If result is POSITIVE SARS-CoV-2 target nucleic acids are DETECTED. The SARS-CoV-2 RNA is generally detectable in upper and lower  respiratory specimens dur ing the acute phase of infection.  Positive  results are indicative of active infection with SARS-CoV-2.  Clinical  correlation with patient history and other diagnostic information is  necessary to determine patient infection status.  Positive results do  not rule out bacterial infection or co-infection with other viruses. If result is PRESUMPTIVE POSTIVE SARS-CoV-2 nucleic acids MAY BE PRESENT.   A presumptive positive result was obtained on the submitted specimen  and confirmed on repeat testing.  While 2019 novel coronavirus  (SARS-CoV-2) nucleic acids may be present in the submitted sample  additional confirmatory testing may be necessary for  epidemiological  and / or clinical management purposes  to differentiate between  SARS-CoV-2 and other Sarbecovirus currently known to infect humans.  If clinically indicated additional testing with an alternate test  methodology [phone]) is advised. The SARS-CoV-2 RNA is generally  detectable in upper and lower respiratory sp ecimens during the acute  phase of infection. The expected result is Negative. Fact Sheet for Patients:  StrictlyIdeas.no Fact Sheet for Healthcare Providers: BankingDealers.co.za This test is not yet approved or cleared by the [place] FDA and has been authorized for detection and/or diagnosis of SARS-CoV-2 by FDA under an Emergency Use Authorization (EUA).   This EUA will remain in effect (meaning this test can be used) for the duration of the COVID-19 declaration under Section 564(b)(1) of the Act, 21 U.S.C. section 360bbb-3(b)(1), unless the authorization is terminated or revoked sooner. Performed at [facility], [address], [place], [place] [address]      Radiology Studies: Ct Abdomen Pelvis W Contrast  Result Date: 01/25/2019 CLINICAL DATA:  Severe abdominal pain. EXAM: CT ABDOMEN AND PELVIS WITH CONTRAST TECHNIQUE: Multidetector CT imaging of the abdomen and pelvis was performed using the standard protocol following bolus administration of intravenous contrast. CONTRAST:  137mL OMNIPAQUE IOHEXOL 300 MG/ML  SOLN COMPARISON:  01/26/2018 CT abdomen/pelvis. FINDINGS: Lower chest: No significant pulmonary nodules or acute consolidative airspace disease. Hepatobiliary: Normal liver size. Subcentimeter hypodense segment 2 left liver lobe lesion, too small to characterize, unchanged, considered benign. No new liver lesions. Normal gallbladder with no radiopaque cholelithiasis. No biliary ductal dilatation. Pancreas: Normal, with no mass or duct dilation. Spleen: Normal size. No mass. Adrenals/Urinary Tract: Normal adrenals. No hydronephrosis. Subcentimeter hypodense renal cortical lesion in interpolar right kidney is too small to characterize and requires no follow-up. No additional renal lesions. Normal bladder. Stomach/Bowel: Normal non-distended stomach. Normal caliber small bowel with no small bowel wall thickening. Normal appendix. Moderate sigmoid diverticulosis. There is segmental moderate wall thickening with prominent surrounding fat stranding in mid sigmoid colon, compatible with acute sigmoid diverticulitis. A few tiny foci of free air indicate localized perforation. No pericolonic abscess. Vascular/Lymphatic: Atherosclerotic nonaneurysmal abdominal aorta. Patent portal, splenic, hepatic and renal veins. No pathologically enlarged lymph nodes in  the abdomen or pelvis. Reproductive: Top-normal size prostate. Nonspecific tiny internal prostatic calcifications. Other: No ascites. Musculoskeletal: No aggressive appearing focal osseous lesions. Mild thoracolumbar spondylosis. IMPRESSION: 1. Acute sigmoid diverticulitis with tiny foci of pericolonic free air indicating perforation. No discrete abscess. 2.  Aortic Atherosclerosis (ICD10-I70.0). Electronically Signed   By: Ilona Sorrel M.D.   On: 01/25/2019 16:10    Scheduled Meds:  enoxaparin (LOVENOX) injection  40 mg Subcutaneous Z16R   folic acid  1 mg Oral Daily   multivitamin with minerals  1 tablet Oral Daily   sodium chloride flush  3 mL Intravenous Once   thiamine  100 mg Oral Daily   Or   thiamine  100 mg Intravenous Daily   Continuous Infusions:  sodium chloride 75 mL/hr at 01/25/19 2152   ciprofloxacin 400 mg (01/26/19 1114)   metronidazole 500 mg (01/26/19 0928)     LOS: 0 days    Time spent:30 minutes.    Barton Dubois, MD [facility] Pager [phone]   01/26/2019, 4:27 PM

## 2019-01-27 DIAGNOSIS — R1032 Left lower quadrant pain: Secondary | ICD-10-CM | POA: Diagnosis not present

## 2019-01-27 DIAGNOSIS — E349 Endocrine disorder, unspecified: Secondary | ICD-10-CM

## 2019-01-27 DIAGNOSIS — K5720 Diverticulitis of large intestine with perforation and abscess without bleeding: Secondary | ICD-10-CM | POA: Diagnosis not present

## 2019-01-27 DIAGNOSIS — I10 Essential (primary) hypertension: Secondary | ICD-10-CM

## 2019-01-27 DIAGNOSIS — K5792 Diverticulitis of intestine, part unspecified, without perforation or abscess without bleeding: Secondary | ICD-10-CM | POA: Diagnosis not present

## 2019-01-27 DIAGNOSIS — K219 Gastro-esophageal reflux disease without esophagitis: Secondary | ICD-10-CM | POA: Diagnosis not present

## 2019-01-27 DIAGNOSIS — Z789 Other specified health status: Secondary | ICD-10-CM | POA: Diagnosis not present

## 2019-01-27 MED ORDER — SACCHAROMYCES BOULARDII 250 MG PO CAPS: 250.0000 mg | ORAL_CAPSULE | Freq: Two times a day (BID) | ORAL | 0 refills | Status: DC

## 2019-01-27 MED ORDER — OXYCODONE-ACETAMINOPHEN 5-325 MG PO TABS: 1.0000 | ORAL_TABLET | Freq: Three times a day (TID) | ORAL | 0 refills | Status: AC | PRN

## 2019-01-27 MED ORDER — AMOXICILLIN-POT CLAVULANATE 875-125 MG PO TABS: 1.0000 | ORAL_TABLET | Freq: Two times a day (BID) | ORAL | 0 refills | Status: AC

## 2019-01-27 MED ORDER — ADULT MULTIVITAMIN W/MINERALS CH: 1.0000 | ORAL_TABLET | Freq: Every day | ORAL | Status: DC

## 2019-01-27 NOTE — Discharge Summary (Signed)
Physician Discharge Summary  Nicholas Sharp:[id number] DOB: 07-22-1965 DOA: 01/25/2019  PCP: Nicholas Albert, MD  Admit date: 01/25/2019 Discharge date: 01/27/2019  Time spent: 35 minutes  Recommendations for Outpatient Follow-up:  1. Repeat CBC to follow WBCs trend 2. Continue assisting with alcohol cessation   Discharge Diagnoses:  Active Problems:   GERD (gastroesophageal reflux disease)   Hypotestosteronism   Essential hypertension, benign   ADHD (attention deficit hyperactivity disorder)   Alcohol consumption heavy   Acute diverticulitis   Diverticulitis of colon with perforation   Left lower quadrant abdominal pain   Discharge Condition: Stable and improved.  Patient discharged home with instruction to follow-up with PCP and general surgery as an outpatient.  Diet recommendation: Low residue diet.  Filed Weights   01/25/19 1311 01/25/19 2042  Weight: 102.1 kg 104.8 kg    History of present illness:  As per H&P written by Dr. Randol Sharp on 01/25/2019 53 y.o.male,with past medical history for GERD, hyperlipidemia, presents to ED secondary to complaints of abdominal pain, reports pain for last 48 hours, left lower abdomen, radiating to his rectum, denies any nausea, vomiting, diarrhea or constipation, no bright red blood per rectum or melena, denies fever, chills, cough, or COVID-19 exposure, patient reports history of diverticulosis diagnosed with a screening colonoscopy couple years ago in [place]. - in EDpatient with mild leukocytosis at 12 K, T abdomen pelvis significant for acute diverticulitis with microperforation, started on Cipro and Flagyl and TRH calleed to admit patient for further evaluation and management.  Hospital Course:  1-acute diverticulitis with microperforation -Abdominal pain significantly improved at time of discharge. -No nausea or vomiting currently -Continue as needed analgesics and low residue diet -Patient advised to maintain  adequate hydration -12 more days of oral antibiotics to be pursued as recommended by general surgery -Outpatient follow-up with general surgery and PCP after discharge.  2-alcohol use -No acute withdrawal appreciated -Continue multivitamin for thiamine and folic acid supplementation -Alcohol cessation counseling provided.  3-hypotestosteronism  -Continue outpatient injections and close follow up.   4-leukocytosis  -In the setting of #1 -Improved and within normal limits at discharge -no fever -Pete CBC at follow-up visit to reassess WBCs trend Complete antibiotic therapy.  Procedures:  See below for x-ray reports.  Consultations:  General surgery  Gastroenterology  Discharge Exam: Vitals:   01/26/19 2146 01/27/19 0503  BP: 133/88 (!) 143/85  Pulse: 80 100  Resp: 20 20  Temp: 98.2 F (36.8 C) 98.7 F (37.1 C)  SpO2: 98% 96%    General: Reports significant improvement in abdominal pain, no nausea, no vomiting, tolerating diet.  Patient is afebrile. Cardiovascular: S1 and S2, no rubs, no gallops, no murmurs.   Respiratory: Clear to auscultation bilateral Abdomen: Soft, nondistended, positive bowel sounds; mild left lower quadrant discomfort with deep palpation Extremities: No edema, no cyanosis, no clubbing.   Discharge Instructions   Discharge Instructions    Diet - low sodium heart healthy   Complete by: As directed    Discharge instructions   Complete by: As directed    Take medications as prescribed Follow low-sodium diet Maintain adequate hydration Stop overusing alcohol Follow-up with general surgery as instructed.   Increase activity slowly   Complete by: As directed      Allergies as of 01/27/2019      Reactions   Hydrocodone    Felt really weird and strange      Medication List    STOP taking these medications   hydrOXYzine  50 MG tablet Commonly known as: ATARAX/VISTARIL   metoprolol tartrate 50 MG tablet Commonly known as:  LOPRESSOR   predniSONE 10 MG (21) Tbpk tablet Commonly known as: STERAPRED UNI-PAK 21 TAB     TAKE these medications   acetaminophen 500 MG tablet Commonly known as: TYLENOL Take 500-1,000 mg by mouth every 6 (six) hours as needed for mild pain or moderate pain.   ALPRAZolam 1 MG tablet Commonly known as: XANAX Take one tablet po prn TID for anxiety ; use sparingly, must last one month What changed:   how much to take  how to take this  when to take this  additional instructions   amoxicillin-clavulanate 875-125 MG tablet Commonly known as: Augmentin Take 1 tablet by mouth 2 (two) times daily for 12 days.   multivitamin with minerals Tabs tablet Take 1 tablet by mouth daily. Start taking on: January 28, 2019   oxyCODONE-acetaminophen 5-325 MG tablet Commonly known as: PERCOCET/ROXICET Take 1 tablet by mouth every 8 (eight) hours as needed for up to 5 days for severe pain. What changed:   when to take this  reasons to take this  Another medication with the same name was removed. Continue taking this medication, and follow the directions you see here.   saccharomyces boulardii 250 MG capsule Commonly known as: Florastor Take 1 capsule (250 mg total) by mouth 2 (two) times daily.   testosterone cypionate 200 MG/ML injection Commonly known as: DEPOTESTOSTERONE CYPIONATE Inject 100 mg into the muscle every Friday.   tiZANidine 4 MG tablet Commonly known as: Zanaflex Take 1 tablet (4 mg total) by mouth every 8 (eight) hours as needed for muscle spasms.   vitamin C 1000 MG tablet Take 1,000 mg by mouth daily.      Allergies  Allergen Reactions  . Hydrocodone     Felt really weird and strange   Follow-up Information    Lucretia Roers, MD Follow up on 02/28/2019.   Specialty: General Surgery Why: follow up after diverticulitis with microperforation Contact information: [address] [facility] [address] [phone]        Nicholas Albert, MD. Schedule an appointment as soon as possible for a visit in 2 week(s).   Specialty: Family Medicine Contact information: [address] [place] [address] [phone]           The results of significant diagnostics from this hospitalization (including imaging, microbiology, ancillary and laboratory) are listed below for reference.    Significant Diagnostic Studies: Ct Abdomen Pelvis W Contrast  Result Date: 01/25/2019 CLINICAL DATA:  Severe abdominal pain. EXAM: CT ABDOMEN AND PELVIS WITH CONTRAST TECHNIQUE: Multidetector CT imaging of the abdomen and pelvis was performed using the standard protocol following bolus administration of intravenous contrast. CONTRAST:  OMNIPAQUE IOHEXOL 300 MG/ML  SOLN COMPARISON:  01/26/2018 CT abdomen/pelvis. FINDINGS: Lower chest: No significant pulmonary nodules or acute consolidative airspace disease. Hepatobiliary: Normal liver size. Subcentimeter hypodense segment 2 left liver lobe lesion, too small to characterize, unchanged, considered benign. No new liver lesions. Normal gallbladder with no radiopaque cholelithiasis. No biliary ductal dilatation. Pancreas: Normal, with no mass or duct dilation. Spleen: Normal size. No mass. Adrenals/Urinary Tract: Normal adrenals. No hydronephrosis. Subcentimeter hypodense renal cortical lesion in interpolar right kidney is too small to characterize and requires no follow-up. No additional renal lesions. Normal bladder. Stomach/Bowel: Normal non-distended stomach. Normal caliber small bowel with no small bowel wall thickening. Normal appendix. Moderate sigmoid diverticulosis. There is segmental  moderate wall thickening with prominent surrounding fat stranding in mid sigmoid colon, compatible with acute sigmoid diverticulitis. A few tiny foci of free air indicate localized perforation. No pericolonic abscess. Vascular/Lymphatic: Atherosclerotic nonaneurysmal abdominal aorta. Patent portal, splenic,  hepatic and renal veins. No pathologically enlarged lymph nodes in the abdomen or pelvis. Reproductive: Top-normal size prostate. Nonspecific tiny internal prostatic calcifications. Other: No ascites. Musculoskeletal: No aggressive appearing focal osseous lesions. Mild thoracolumbar spondylosis. IMPRESSION: 1. Acute sigmoid diverticulitis with tiny foci of pericolonic free air indicating perforation. No discrete abscess. 2.  Aortic Atherosclerosis (ICD10-I70.0). Electronically Signed   By: Delbert PhenixJason A Poff M.D.   On: 01/25/2019 16:10   Microbiology: Recent Results (from the past 240 hour(s))  SARS Coronavirus 2 by RT PCR (hospital order, performed in [facility] hospital lab) Nasopharyngeal Nasopharyngeal Swab     Status: None   Collection Time: 01/25/19  1:40 PM   Specimen: Nasopharyngeal Swab  Result Value Ref Range Status   SARS Coronavirus 2 NEGATIVE NEGATIVE Final    Comment: (NOTE) If result is NEGATIVE SARS-CoV-2 target nucleic acids are NOT DETECTED. The SARS-CoV-2 RNA is generally detectable in upper and lower  respiratory specimens during the acute phase of infection. The lowest  concentration of SARS-CoV-2 viral copies this assay can detect is 250  copies / mL. A negative result does not preclude SARS-CoV-2 infection  and should not be used as the sole basis for treatment or other  patient management decisions.  A negative result may occur with  improper specimen collection / handling, submission of specimen other  than nasopharyngeal swab, presence of viral mutation(s) within the  areas targeted by this assay, and inadequate number of viral copies  (<250 copies / mL). A negative result must be combined with clinical  observations, patient history, and epidemiological information. If result is POSITIVE SARS-CoV-2 target nucleic acids are DETECTED. The SARS-CoV-2 RNA is generally detectable in upper and lower  respiratory specimens dur ing the acute phase of infection.  Positive   results are indicative of active infection with SARS-CoV-2.  Clinical  correlation with patient history and other diagnostic information is  necessary to determine patient infection status.  Positive results do  not rule out bacterial infection or co-infection with other viruses. If result is PRESUMPTIVE POSTIVE SARS-CoV-2 nucleic acids MAY BE PRESENT.   A presumptive positive result was obtained on the submitted specimen  and confirmed on repeat testing.  While 2019 novel coronavirus  (SARS-CoV-2) nucleic acids may be present in the submitted sample  additional confirmatory testing may be necessary for epidemiological  and / or clinical management purposes  to differentiate between  SARS-CoV-2 and other Sarbecovirus currently known to infect humans.  If clinically indicated additional testing with an alternate test  methodology [phone]([id number]) is advised. The SARS-CoV-2 RNA is generally  detectable in upper and lower respiratory sp ecimens during the acute  phase of infection. The expected result is Negative. Fact Sheet for Patients:  BoilerBrush.com.cyhttps://www.fda.gov/media/136312/download Fact Sheet for Healthcare Providers: https://pope.com/https://www.fda.gov/media/136313/download This test is not yet approved or cleared by the [place] FDA and has been authorized for detection and/or diagnosis of SARS-CoV-2 by FDA under an Emergency Use Authorization (EUA).  This EUA will remain in effect (meaning this test can be used) for the duration of the COVID-19 declaration under Section 564(b)(1) of the Act, 21 U.S.C. section 360bbb-3(b)(1), unless the authorization is terminated or revoked sooner. Performed at [facility], [address] [place], [place] [address]      Labs:  Basic Metabolic Panel: Recent Labs  Lab 01/25/19 1400 01/26/19 0600  NA 136 137  K 3.8 3.5  CL 100 103  CO2 27 24  GLUCOSE 116* 91  BUN 15 15  CREATININE 0.70 0.80  CALCIUM 8.6* 8.4*   Liver Function Tests: Recent Labs  Lab  01/25/19 1400  AST 26  ALT 57*  ALKPHOS 77  BILITOT 1.0  PROT 7.2  ALBUMIN 3.7   Recent Labs  Lab 01/25/19 1400  LIPASE 29   CBC: Recent Labs  Lab 01/25/19 1400 01/26/19 0600  WBC 12.8* 10.0  HGB 17.4* 17.3*  HCT 55.7* 54.7*  MCV 92.4 91.5  PLT 216 198    Signed:  Barton Dubois MD.  [facility] 01/27/2019, 3:26 PM

## 2019-01-27 NOTE — Progress Notes (Signed)
Subjective: Is feeling substantially better since about 6 am. Minimal to no abdominal pain. Denies N/V. No other GI complaints. Has several questions about diet at discharge which we discussed.  Objective: Vital signs in last 24 hours: Temp:  [98.2 F (36.8 C)-98.7 F (37.1 C)] 98.7 F (37.1 C) (10/30 0503) Pulse Rate:  [70-100] 100 (10/30 0503) Resp:  [20] 20 (10/30 0503) BP: (133-143)/(85-97) 143/85 (10/30 0503) SpO2:  [96 %-98 %] 96 % (10/30 0503) Last BM Date: 01/26/19 General:   Alert and oriented, pleasant Head:  Normocephalic and atraumatic. Eyes:  No icterus, sclera clear. Conjuctiva pink.  Heart:  S1, S2 present, no murmurs noted.  Lungs: Clear to auscultation bilaterally, without wheezing, rales, or rhonchi.  Abdomen:  Bowel sounds present, soft, non-tender, non-distended. No HSM or hernias noted. No rebound or guarding. No masses appreciated  Msk:  Symmetrical without gross deformities. Pulses:  Normal bilateral DP pulses noted. Extremities:  Without clubbing or edema. Neurologic:  Alert and  oriented x4;  grossly normal neurologically. Psych:  Alert and cooperative. Normal mood and affect.  Intake/Output from previous day: No intake/output data recorded. Intake/Output this shift: No intake/output data recorded.  Lab Results: Recent Labs    01/25/19 1400 01/26/19 0600  WBC 12.8* 10.0  HGB 17.4* 17.3*  HCT 55.7* 54.7*  PLT 216 198   BMET Recent Labs    01/25/19 1400 01/26/19 0600  NA 136 137  K 3.8 3.5  CL 100 103  CO2 27 24  GLUCOSE 116* 91  BUN 15 15  CREATININE 0.70 0.80  CALCIUM 8.6* 8.4*   LFT Recent Labs    01/25/19 1400  PROT 7.2  ALBUMIN 3.7  AST 26  ALT 57*  ALKPHOS 77  BILITOT 1.0   PT/INR No results for input(s): LABPROT, INR in the last 72 hours. Hepatitis Panel No results for input(s): HEPBSAG, HCVAB, HEPAIGM, HEPBIGM in the last 72 hours.   Studies/Results: Ct Abdomen Pelvis W Contrast  Result Date:  01/25/2019 CLINICAL DATA:  Severe abdominal pain. EXAM: CT ABDOMEN AND PELVIS WITH CONTRAST TECHNIQUE: Multidetector CT imaging of the abdomen and pelvis was performed using the standard protocol following bolus administration of intravenous contrast. CONTRAST:  OMNIPAQUE IOHEXOL 300 MG/ML  SOLN COMPARISON:  01/26/2018 CT abdomen/pelvis. FINDINGS: Lower chest: No significant pulmonary nodules or acute consolidative airspace disease. Hepatobiliary: Normal liver size. Subcentimeter hypodense segment 2 left liver lobe lesion, too small to characterize, unchanged, considered benign. No new liver lesions. Normal gallbladder with no radiopaque cholelithiasis. No biliary ductal dilatation. Pancreas: Normal, with no mass or duct dilation. Spleen: Normal size. No mass. Adrenals/Urinary Tract: Normal adrenals. No hydronephrosis. Subcentimeter hypodense renal cortical lesion in interpolar right kidney is too small to characterize and requires no follow-up. No additional renal lesions. Normal bladder. Stomach/Bowel: Normal non-distended stomach. Normal caliber small bowel with no small bowel wall thickening. Normal appendix. Moderate sigmoid diverticulosis. There is segmental moderate wall thickening with prominent surrounding fat stranding in mid sigmoid colon, compatible with acute sigmoid diverticulitis. A few tiny foci of free air indicate localized perforation. No pericolonic abscess. Vascular/Lymphatic: Atherosclerotic nonaneurysmal abdominal aorta. Patent portal, splenic, hepatic and renal veins. No pathologically enlarged lymph nodes in the abdomen or pelvis. Reproductive: Top-normal size prostate. Nonspecific tiny internal prostatic calcifications. Other: No ascites. Musculoskeletal: No aggressive appearing focal osseous lesions. Mild thoracolumbar spondylosis. IMPRESSION: 1. Acute sigmoid diverticulitis with tiny foci of pericolonic free air indicating perforation. No discrete abscess. 2.  Aortic  Atherosclerosis (ICD10-I70.0).  Electronically Signed   By: Ilona Sorrel M.D.   On: 01/25/2019 16:10    Assessment: Pleasant 53 year old gentleman presenting with progressive left lower quadrant pain, subjective fever and chills.  CT shows acute sigmoid diverticulitis with tiny foci of pericolonic free air indicating perforation.  No abscess seen.  This is patient's first episode of documented diverticulitis.  Colonoscopy in August 2018 with left-sided diverticulosis.  Leukocytosis resolved overnight Wednesday-Thursday.  He is on IV Cipro and IV Flagyl.  Tolerating clear liquids. And pain has been slowly improving.  He was seen by surgery yesterday, no abscess noted and unlikely need for IR. Plan acute treatment and outpatient surgical follow-up in 4 weeks to discuss options of possible surgical management.  Elevated ALT: chronic finding. F/U with PCP or primary GI ([facility]).  Plan: 1. Continue IV antibiotics while inpatient; complete total 14 day course of antibiotics 2. Soft diet during home recuperation 3. High fiber diet at home after acute resolution 4. Follow-up with surgery 4 weeks after discharge per their recommendations 5. Follow-up with Dr. Loletha Carrow ([facility] at discharge)   Thank you for allowing [place] to participate in the care of Nicholas Slipper, DNP, AGNP-C Adult & Gerontological Nurse Practitioner [facility]     LOS: 1 day    01/27/2019, 8:17 AM

## 2019-01-27 NOTE — Discharge Instructions (Signed)
Bland Diet A bland diet consists of foods that are often soft and do not have a lot of fat, fiber, or extra seasonings. Foods without fat, fiber, or seasoning are easier for the body to digest. They are also less likely to irritate your mouth, throat, stomach, and other parts of your digestive system. A bland diet is sometimes called a BRAT diet. What is my plan? Your health care provider or food and nutrition specialist (dietitian) may recommend specific changes to your diet to prevent symptoms or to treat your symptoms. These changes may include:  Eating small meals often.  Cooking food until it is soft enough to chew easily.  Chewing your food well.  Drinking fluids slowly.  Not eating foods that are very spicy, sour, or fatty.  Not eating citrus fruits, such as oranges and grapefruit. What do I need to know about this diet?  Eat a variety of foods from the bland diet food list.  Do not follow a bland diet longer than needed.  Ask your health care provider whether you should take vitamins or supplements. What foods can I eat? Grains  Hot cereals, such as cream of wheat. Rice. Bread, crackers, or tortillas made from refined white flour. Vegetables Canned or cooked vegetables. Mashed or boiled potatoes. Fruits  Bananas. Applesauce. Other types of cooked or canned fruit with the skin and seeds removed, such as canned peaches or pears. Meats and other proteins  Scrambled eggs. Creamy peanut butter or other nut butters. Lean, well-cooked meats, such as chicken or fish. Tofu. Soups or broths. Dairy Low-fat dairy products, such as milk, cottage cheese, or yogurt. Beverages  Water. Herbal tea. Apple juice. Fats and oils Mild salad dressings. Canola or olive oil. Sweets and desserts Pudding. Custard. Fruit gelatin. Ice cream. The items listed above may not be a complete list of recommended foods and beverages. Contact a dietitian for more options. What foods are not  recommended? Grains Whole grain breads and cereals. Vegetables Raw vegetables. Fruits Raw fruits, especially citrus, berries, or dried fruits. Dairy Whole fat dairy foods. Beverages Caffeinated drinks. Alcohol. Seasonings and condiments Strongly flavored seasonings or condiments. Hot sauce. Salsa. Other foods Spicy foods. Fried foods. Sour foods, such as pickled or fermented foods. Foods with high sugar content. Foods high in fiber. The items listed above may not be a complete list of foods and beverages to avoid. Contact a dietitian for more information. Summary  A bland diet consists of foods that are often soft and do not have a lot of fat, fiber, or extra seasonings.  Foods without fat, fiber, or seasoning are easier for the body to digest.  Check with your health care provider to see how long you should follow this diet plan. It is not meant to be followed for long periods. This information is not intended to replace advice given to you by your health care provider. Make sure you discuss any questions you have with your health care provider. Document Released: 07/08/2015 Document Revised: 04/14/2017 Document Reviewed: 04/14/2017 Elsevier Patient Education  2020 [employer].   Diverticulitis  Diverticulitis is infection or inflammation of small pouches (diverticula) in the colon that form due to a condition called diverticulosis. Diverticula can trap stool (feces) and bacteria, causing infection and inflammation. Diverticulitis may cause severe stomach pain and diarrhea. It may lead to tissue damage in the colon that causes bleeding. The diverticula may also burst (rupture) and cause infected stool to enter other areas of the abdomen. Complications of  diverticulitis can include:  Bleeding.  Severe infection.  Severe pain.  Rupture (perforation) of the colon.  Blockage (obstruction) of the colon. What are the causes? This condition is caused by stool becoming trapped  in the diverticula, which allows bacteria to grow in the diverticula. This leads to inflammation and infection. What increases the risk? You are more likely to develop this condition if:  You have diverticulosis. The risk for diverticulosis increases if: ? You are overweight or obese. ? You use tobacco products. ? You do not get enough exercise.  You eat a diet that does not include enough fiber. High-fiber foods include fruits, vegetables, beans, nuts, and whole grains. What are the signs or symptoms? Symptoms of this condition may include:  Pain and tenderness in the abdomen. The pain is normally located on the left side of the abdomen, but it may occur in other areas.  Fever and chills.  Bloating.  Cramping.  Nausea.  Vomiting.  Changes in bowel routines.  Blood in your stool. How is this diagnosed? This condition is diagnosed based on:  Your medical history.  A physical exam.  Tests to make sure there is nothing else causing your condition. These tests may include: ? Blood tests. ? Urine tests. ? Imaging tests of the abdomen, including X-rays, ultrasounds, MRIs, or CT scans. How is this treated? Most cases of this condition are mild and can be treated at home. Treatment may include:  Taking over-the-counter pain medicines.  Following a clear liquid diet.  Taking antibiotic medicines by mouth.  Rest. More severe cases may need to be treated at a hospital. Treatment may include:  Not eating or drinking.  Taking prescription pain medicine.  Receiving antibiotic medicines through an IV tube.  Receiving fluids and nutrition through an IV tube.  Surgery. When your condition is under control, your health care provider may recommend that you have a colonoscopy. This is an exam to look at the entire large intestine. During the exam, a lubricated, bendable tube is inserted into the anus and then passed into the rectum, colon, and other parts of the large  intestine. A colonoscopy can show how severe your diverticula are and whether something else may be causing your symptoms. Follow these instructions at home: Medicines  Take over-the-counter and prescription medicines only as told by your health care provider. These include fiber supplements, probiotics, and stool softeners.  If you were prescribed an antibiotic medicine, take it as told by your health care provider. Do not stop taking the antibiotic even if you start to feel better.  Do not drive or use heavy machinery while taking prescription pain medicine. General instructions   Follow a full liquid diet or another diet as directed by your health care provider. After your symptoms improve, your health care provider may tell you to change your diet. He or she may recommend that you eat a diet that contains at least 25 g (25 grams) of fiber daily. Fiber makes it easier to pass stool. Healthy sources of fiber include: ? Berries. One cup contains 4-8 grams of fiber. ? Beans or lentils. One half cup contains 5-8 grams of fiber. ? Green vegetables. One cup contains 4 grams of fiber.  Exercise for at least 30 minutes, 3 times each week. You should exercise hard enough to raise your heart rate and break a sweat.  Keep all follow-up visits as told by your health care provider. This is important. You may need a colonoscopy. Contact a health  care provider if:  Your pain does not improve.  You have a hard time drinking or eating food.  Your bowel movements do not return to normal. Get help right away if:  Your pain gets worse.  Your symptoms do not get better with treatment.  Your symptoms suddenly get worse.  You have a fever.  You vomit more than one time.  You have stools that are bloody, black, or tarry. Summary  Diverticulitis is infection or inflammation of small pouches (diverticula) in the colon that form due to a condition called diverticulosis. Diverticula can trap stool  (feces) and bacteria, causing infection and inflammation.  You are at higher risk for this condition if you have diverticulosis and you eat a diet that does not include enough fiber.  Most cases of this condition are mild and can be treated at home. More severe cases may need to be treated at a hospital.  When your condition is under control, your health care provider may recommend that you have an exam called a colonoscopy. This exam can show how severe your diverticula are and whether something else may be causing your symptoms. This information is not intended to replace advice given to you by your health care provider. Make sure you discuss any questions you have with your health care provider. Document Released: 12/24/2004 Document Revised: 02/26/2017 Document Reviewed: 04/18/2016 Elsevier Patient Education  2020 [employer].

## 2019-01-27 NOTE — Progress Notes (Signed)
Nsg Discharge Note  Admit Date:  01/25/2019 Discharge date: 01/27/2019   Nicholas Sharp to be D/C'd Home per MD order.  AVS completed.  Copy for chart, and copy for patient signed, and dated. Patient/caregiver able to verbalize understanding.  Discharge Medication: Allergies as of 01/27/2019      Reactions   Hydrocodone    Felt really weird and strange      Medication List    STOP taking these medications   hydrOXYzine 50 MG tablet Commonly known as: ATARAX/VISTARIL   metoprolol tartrate 50 MG tablet Commonly known as: LOPRESSOR   predniSONE 10 MG (21) Tbpk tablet Commonly known as: STERAPRED UNI-PAK 21 TAB     TAKE these medications   acetaminophen 500 MG tablet Commonly known as: TYLENOL Take 500-1,000 mg by mouth every 6 (six) hours as needed for mild pain or moderate pain.   ALPRAZolam 1 MG tablet Commonly known as: XANAX Take one tablet po prn TID for anxiety ; use sparingly, must last one month What changed:   how much to take  how to take this  when to take this  additional instructions   amoxicillin-clavulanate 875-125 MG tablet Commonly known as: Augmentin Take 1 tablet by mouth 2 (two) times daily for 12 days.   multivitamin with minerals Tabs tablet Take 1 tablet by mouth daily. Start taking on: January 28, 2019   oxyCODONE-acetaminophen 5-325 MG tablet Commonly known as: PERCOCET/ROXICET Take 1 tablet by mouth every 8 (eight) hours as needed for up to 5 days for severe pain. What changed:   when to take this  reasons to take this  Another medication with the same name was removed. Continue taking this medication, and follow the directions you see here.   saccharomyces boulardii 250 MG capsule Commonly known as: Florastor Take 1 capsule (250 mg total) by mouth 2 (two) times daily.   testosterone cypionate 200 MG/ML injection Commonly known as: DEPOTESTOSTERONE CYPIONATE Inject 100 mg into the muscle every Friday.   tiZANidine 4 MG  tablet Commonly known as: Zanaflex Take 1 tablet (4 mg total) by mouth every 8 (eight) hours as needed for muscle spasms.   vitamin C 1000 MG tablet Take 1,000 mg by mouth daily.       Discharge Assessment: Vitals:   01/26/19 2146 01/27/19 0503  BP: 133/88 (!) 143/85  Pulse: 80 100  Resp: 20 20  Temp: 98.2 F (36.8 C) 98.7 F (37.1 C)  SpO2: 98% 96%   Skin clean, dry and intact without evidence of skin break down, no evidence of skin tears noted. IV catheter discontinued intact. Site without signs and symptoms of complications - no redness or edema noted at insertion site, patient denies c/o pain - only slight tenderness at site.  Dressing with slight pressure applied.  D/c Instructions-Education: Discharge instructions given to patient/family with verbalized understanding. D/c education completed with patient/family including follow up instructions, medication list, d/c activities limitations if indicated, with other d/c instructions as indicated by MD - patient able to verbalize understanding, all questions fully answered. Patient instructed to return to ED, call 911, or call MD for any changes in condition.  Patient escorted via [name], and D/C home via private auto.  Dorcas Mcmurray, LPN 13/24/4010 2:72 PM

## 2019-01-27 NOTE — Progress Notes (Signed)
[facility] Progress Note     Subjective: Pain improved. Having Bms. Has not ate soft diet yet.    Objective: Vital signs in last 24 hours: Temp:  [98.2 F (36.8 C)-98.7 F (37.1 C)] 98.7 F (37.1 C) (10/30 0503) Pulse Rate:  [70-100] 100 (10/30 0503) Resp:  [20] 20 (10/30 0503) BP: (133-143)/(85-97) 143/85 (10/30 0503) SpO2:  [96 %-98 %] 96 % (10/30 0503) Last BM Date: 01/26/19  Intake/Output from previous day: No intake/output data recorded. Intake/Output this shift: Total I/O In: 240 [P.O.:240] Out: -   General appearance: alert, cooperative and no distress Resp: normal work of breathing GI: soft, non-tender; bowel sounds normal; no masses,  no organomegaly  Lab Results:  Recent Labs    01/25/19 1400 01/26/19 0600  WBC 12.8* 10.0  HGB 17.4* 17.3*  HCT 55.7* 54.7*  PLT 216 198   BMET Recent Labs    01/25/19 1400 01/26/19 0600  NA 136 137  K 3.8 3.5  CL 100 103  CO2 27 24  GLUCOSE 116* 91  BUN 15 15  CREATININE 0.70 0.80  CALCIUM 8.6* 8.4*   PT/INR No results for input(s): LABPROT, INR in the last 72 hours.  Studies/Results: Ct Abdomen Pelvis W Contrast  Result Date: 01/25/2019 CLINICAL DATA:  Severe abdominal pain. EXAM: CT ABDOMEN AND PELVIS WITH CONTRAST TECHNIQUE: Multidetector CT imaging of the abdomen and pelvis was performed using the standard protocol following bolus administration of intravenous contrast. CONTRAST:  OMNIPAQUE IOHEXOL 300 MG/ML  SOLN COMPARISON:  01/26/2018 CT abdomen/pelvis. FINDINGS: Lower chest: No significant pulmonary nodules or acute consolidative airspace disease. Hepatobiliary: Normal liver size. Subcentimeter hypodense segment 2 left liver lobe lesion, too small to characterize, unchanged, considered benign. No new liver lesions. Normal gallbladder with no radiopaque cholelithiasis. No biliary ductal dilatation. Pancreas: Normal, with no mass or duct dilation. Spleen: Normal size. No mass.  Adrenals/Urinary Tract: Normal adrenals. No hydronephrosis. Subcentimeter hypodense renal cortical lesion in interpolar right kidney is too small to characterize and requires no follow-up. No additional renal lesions. Normal bladder. Stomach/Bowel: Normal non-distended stomach. Normal caliber small bowel with no small bowel wall thickening. Normal appendix. Moderate sigmoid diverticulosis. There is segmental moderate wall thickening with prominent surrounding fat stranding in mid sigmoid colon, compatible with acute sigmoid diverticulitis. A few tiny foci of free air indicate localized perforation. No pericolonic abscess. Vascular/Lymphatic: Atherosclerotic nonaneurysmal abdominal aorta. Patent portal, splenic, hepatic and renal veins. No pathologically enlarged lymph nodes in the abdomen or pelvis. Reproductive: Top-normal size prostate. Nonspecific tiny internal prostatic calcifications. Other: No ascites. Musculoskeletal: No aggressive appearing focal osseous lesions. Mild thoracolumbar spondylosis. IMPRESSION: 1. Acute sigmoid diverticulitis with tiny foci of pericolonic free air indicating perforation. No discrete abscess. 2.  Aortic Atherosclerosis (ICD10-I70.0). Electronically Signed   By: Delbert Phenix M.D.   On: 01/25/2019 16:10    Anti-infectives: Anti-infectives (From admission, onward)   Start     Dose/Rate Route Frequency Ordered Stop   01/26/19 0800  ciprofloxacin (CIPRO) IVPB 400 mg     400 mg 200 mL/hr over 60 Minutes Intravenous Every 12 hours 01/25/19 1750     01/26/19 0200  metroNIDAZOLE (FLAGYL) IVPB 500 mg     500 mg 100 mL/hr over 60 Minutes Intravenous Every 8 hours 01/25/19 1750     01/25/19 1630  ciprofloxacin (CIPRO) IVPB 400 mg     400 mg 200 mL/hr over 60 Minutes Intravenous  Once 01/25/19 1623 01/25/19 1928   01/25/19 1630  metroNIDAZOLE (FLAGYL)  IVPB 500 mg     500 mg 100 mL/hr over 60 Minutes Intravenous  Once 01/25/19 1623 01/25/19 1927      Assessment/Plan: MR.  Nicholas Sharp is a 53 yo with diverticulitis with microperforation. Doing great. Home with Augmentin to finish 14 day course Can see in 4 weeks to check on patient  Criss Rosales / low fiber diet while diverticulitis being treated    LOS: 1 day    Virl Cagey 01/27/2019

## 2019-02-20 ENCOUNTER — Telehealth: Payer: Self-pay | Admitting: Family Medicine

## 2019-02-20 NOTE — Telephone Encounter (Signed)
Left message to return call. Need more information.

## 2019-02-20 NOTE — Telephone Encounter (Signed)
Pt needs a work note stating he can return to work 12/3. He was seen in Oct for back pain and diverticulitis.   Please fax to [phone] [facility])

## 2019-02-21 ENCOUNTER — Encounter: Payer: Self-pay | Admitting: Family Medicine

## 2019-02-21 NOTE — Telephone Encounter (Signed)
Wis (write I will sign), sure

## 2019-02-21 NOTE — Telephone Encounter (Signed)
Pt returned call and states that he needs a work note to go back to work. Pt states that he was out of work in October due to back injury and then he had diverticulitis. Pt states that his normal vacation would have been the whole month of November. Pt states since he missed a few day of regular work in October he is needing a doctors note. Pt would like to return to work on 03/02/2019. Please advise. Thank you

## 2019-02-21 NOTE — Telephone Encounter (Signed)
Please type up work note and nurses will get provider signature. Thank you

## 2019-03-01 ENCOUNTER — Telehealth: Payer: Self-pay | Admitting: Internal Medicine

## 2019-03-01 NOTE — Telephone Encounter (Signed)
Patient called me November 28 while I was out of town on vacation.  Having waxing and waning intermittent left lower quadrant abdominal pain  -  reminded him of his recent bout of diverticulitis for which he was hospitalized.  No fever no bleeding. I told him to back off to a clear liquid diet and call me the next day.  If pain worsens, he was to call me back that evening  -   he did call me back and said the pain was a boring and unrelenting no fever.  No nausea or vomiting.  Symptoms identical to recent episode of diverticulitis  Patient denies any allergies.  I decided to go ahead and call in empiric antibiotic treatment for him [facility])  I called and Cipro 750 twice daily x10 days and Flagyl 500 3 times daily x10 days.  I told him to stay on a clear liquid diet for now.  If he worsens, he is to let me know.  Further recommendations to follow.  Of note, he wants to have his colonoscopy done by the physician to did his colonoscopy previously in [place].  States he has insecurities about a "friend" doing his procedure although he wants me to continue to be his gastroenterologist.  In addition, from his recent hospitalization he was invited to see Dr. Constance Haw back in the office sometime in December 2020.  I think this is a good idea.  He should follow through on making an appointment.

## 2019-03-01 NOTE — Telephone Encounter (Signed)
Spoke with pt. Pt is starting to feel better than he was feeling last week. Pt's abdominal pain is improving. Pt still reports no nausea and no vomiting. Pt is taking his Antibiotics as directed daily with no problems. Pt is wanting to advance from his liquid diet to soft foods and wanted to make sure it's ok to do so. Pt will avoid fatty, oily fired foods at this time. Pt also mentioned that he's having some lower back pain and inflammation. Pt says he was advised to take only Tylenol and would like to know if he can take another otc med besides Tylenol,  to calm down the pain and inflammation.

## 2019-03-01 NOTE — Telephone Encounter (Signed)
Patient should make an appointment and be seen by Dr. Constance Haw this month

## 2019-03-01 NOTE — Telephone Encounter (Signed)
Noted. Pt is aware of RMR's recommendations and will f/u with Dr. Constance Haw this month.

## 2019-03-01 NOTE — Telephone Encounter (Signed)
Go ahead and advise him he may go to a low residue full liquid diet slowly today-give him examples. May use ibuprofen but would not go over 400 mg per 24 hours.  As abdominal pain totally subsides, may advance to a low residue diet.  Wait 2 weeks before resuming Benefiber. Complete course of antibiotics.

## 2019-03-07 ENCOUNTER — Telehealth: Payer: Self-pay | Admitting: Family Medicine

## 2019-03-07 DIAGNOSIS — Z13220 Encounter for screening for lipoid disorders: Secondary | ICD-10-CM

## 2019-03-07 DIAGNOSIS — I10 Essential (primary) hypertension: Secondary | ICD-10-CM

## 2019-03-07 DIAGNOSIS — Z79899 Other long term (current) drug therapy: Secondary | ICD-10-CM

## 2019-03-07 DIAGNOSIS — Z125 Encounter for screening for malignant neoplasm of prostate: Secondary | ICD-10-CM

## 2019-03-07 NOTE — Telephone Encounter (Signed)
Patient is requesting labs to be done because he would like to know where he is now than what was done in February to compare them. He states not having any problems he changed his diet and loss some weight ,he just wants to see how he is doing and would he need a follow up visit to go over labs. Please advise

## 2019-03-07 NOTE — Telephone Encounter (Signed)
Last labs: 12/2018- cbc cmp                    04/2018-lipid

## 2019-03-07 NOTE — Telephone Encounter (Signed)
Lip liv m7 cbc psa, plus o vdo at least three d before o v

## 2019-03-08 NOTE — Telephone Encounter (Signed)
Blood work ordered in [employer]. Patient notified and scheduled follow up virtual visit

## 2019-03-10 ENCOUNTER — Telehealth: Payer: Self-pay | Admitting: Family Medicine

## 2019-03-10 ENCOUNTER — Encounter: Payer: Self-pay | Admitting: Gastroenterology

## 2019-03-10 ENCOUNTER — Ambulatory Visit (HOSPITAL_COMMUNITY)
Admission: RE | Admit: 2019-03-10 | Discharge: 2019-03-10 | Disposition: A | Discharge status: Home / Self Care | Payer: 59 | Source: Ambulatory Visit | Attending: Gastroenterology | Admitting: Gastroenterology

## 2019-03-10 ENCOUNTER — Ambulatory Visit (INDEPENDENT_AMBULATORY_CARE_PROVIDER_SITE_OTHER): Payer: 59 | Admitting: Gastroenterology

## 2019-03-10 ENCOUNTER — Other Ambulatory Visit: Payer: Self-pay

## 2019-03-10 VITALS — BP 138/80 | HR 69 | Temp 97.0°F | Ht 74.0 in | Wt 217.8 lb

## 2019-03-10 DIAGNOSIS — R1032 Left lower quadrant pain: Secondary | ICD-10-CM | POA: Insufficient documentation

## 2019-03-10 DIAGNOSIS — Z8379 Family history of other diseases of the digestive system: Secondary | ICD-10-CM

## 2019-03-10 MED ORDER — IOHEXOL 300 MG/ML  SOLN
100.0000 mL | Freq: Once | INTRAMUSCULAR | Status: AC | PRN
  Administered 2019-03-10: 100 mL via INTRAVENOUS

## 2019-03-10 MED ORDER — IOHEXOL 9 MG/ML PO SOLN
ORAL | Status: AC
  Filled 2019-03-10: qty 1000

## 2019-03-10 NOTE — Telephone Encounter (Signed)
Add ittga antibody test, beware when adding on to tests already rdered often they fail to do first test unless reordered

## 2019-03-10 NOTE — Telephone Encounter (Signed)
Pt most recent lab work ordered is PSA, CBC, BMET, HEPATIC and LIPID. Please advise. Thank you

## 2019-03-10 NOTE — Assessment & Plan Note (Signed)
Pleasant 53 year old gentleman with history of complicated diverticulitis with microperforation back in October presenting today with intermittent left lower quadrant discomfort which has been worse over the last couple weeks.  States he never really returned to baseline after his episode of diverticulitis in October.  He has some postprandial left lower quadrant tenderness.  No change in bowel habits outside of loose stools while on antibiotic therapy, now resolved.  Colonoscopy up-to-date as outlined.  Would be concerned about possibility of recurrent diverticulitis versus persistent diverticulitis/abscess.  We will plan CT abdomen and pelvis with contrast to further evaluate abdominal pain and rule out abscess.  Encouraged him to keep appointment next week with Dr. Constance Haw. We will touch base with patient later today with further recommendations based on CT findings.

## 2019-03-10 NOTE — Telephone Encounter (Signed)
Patient hasn't had his blood work done yet and was wondering if we could add a test for Celiac's disease and gluten intolerance.  He said his aunt has these problems and he is wondering if he also does.

## 2019-03-10 NOTE — Progress Notes (Signed)
Primary Care Physician: Merlyn Albert, MD  Primary Gastroenterologist:  Roetta Sessions, MD   Chief Complaint  Patient presents with  . Abdominal Pain    comes/goes, left lower, concerned d/t h/o diverticulitis. Finished ABX 2 nights ago    HPI: Nicholas Sharp is a 53 y.o. male here for follow-up of diverticulitis.  First episode of documented diverticulitis back in October.  At the time he been having significant back pain related to a back injury and had noticed some pain in the left lower abdomen but thought it was related.  This went on for several weeks before left lower quadrant pain worsened and he developed a fever. Initially evaluated by PCP back in October through virtual visit.  He was advised to go to the ED for evaluation.  CT on January 25, 2019 in the ED showed acute sigmoid diverticulitis with tiny foci of pericolonic free air indicating perforation.  No discrete abscess.  Patient was admitted for management.  This was his first episode of documented diverticulitis.  He was treated with IV antibiotics and sent home on a course of Augmentin.  Patient states that his abdominal pain significantly improved but never really seem to go away.  Patient reached out to Dr. Jena Gauss on November 28 with complaints of intermittent left lower quadrant pain worsening over couple of weeks.  No fever.  Bowel movements are regular.  Within 24 hours pain somewhat unrelenting and was provided a round of antibiotic therapy in way of Cipro 750 mg twice daily and Flagyl 500 mg 3 times daily both for 10 days.  Abdominal pain again improved on antibiotic therapy.  He had went to low residue diet.  Because of persisting pain he was advised to come in for evaluation.  Patient states bowel movements were regular except while on antibiotics and then became a little bit more loose.  He has been off antibiotics for 2 days, stools back to normal.  No blood in stool or melena.  No nausea or vomiting.   Continues to have postprandial discomfort in the left lower quadrant and sometimes more in the center of his pelvis.  No fever.  No dysuria.  States his back injury has improved.  He is on light duty at work.  Last colonoscopy in August 2018 by Dr. Myrtie Neither, left-sided diverticulosis.  Patient has lost over 20 pounds this year, some of this during his illness but also he contributes to change in diet and decreased alcohol consumption.  Current Outpatient Medications  Medication Sig Dispense Refill  . ALPRAZolam (XANAX) 1 MG tablet Take one tablet po prn TID for anxiety ; use sparingly, must last one month (Patient taking differently: Take 1 mg by mouth at bedtime. *May take one tablet up to three times daily as needed for anxiety*) 30 tablet 2  . Ascorbic Acid (VITAMIN C) 1000 MG tablet Take 1,000 mg by mouth daily.    . Multiple Vitamin (MULTIVITAMIN WITH MINERALS) TABS tablet Take 1 tablet by mouth daily.    Marland Kitchen testosterone cypionate (DEPOTESTOSTERONE CYPIONATE) 200 MG/ML injection Inject 100 mg into the muscle every Friday.      No current facility-administered medications for this visit.    Allergies as of 03/10/2019 - Review Complete 03/10/2019  Allergen Reaction Noted  . Hydrocodone  01/10/2019    ROS:  General: Negative for anorexia, weight loss, fever, chills, fatigue, weakness. ENT: Negative for hoarseness, difficulty swallowing , nasal congestion. CV: Negative for chest pain, angina,  palpitations, dyspnea on exertion, peripheral edema.  Respiratory: Negative for dyspnea at rest, dyspnea on exertion, cough, sputum, wheezing.  GI: See history of present illness. GU:  Negative for dysuria, hematuria, urinary incontinence, urinary frequency, nocturnal urination.  Endo: Negative for unusual weight change.    Physical Examination:   BP 138/80   Pulse 69   Temp (!) 97 F (36.1 C) (Oral)   Ht 6\' 2"  (1.88 m)   Wt 217 lb 12.8 oz (98.8 kg)   BMI 27.96 kg/m   General:  Well-nourished, well-developed in no acute distress.  Eyes: No icterus. Mouth: Oropharyngeal mucosa moist and pink , no lesions erythema or exudate. Lungs: Clear to auscultation bilaterally.  Heart: Regular rate and rhythm, no murmurs rubs or gallops.  Abdomen: Bowel sounds are normal, nondistended, no hepatosplenomegaly or masses, no abdominal bruits or hernia , no rebound or guarding. LLQ tenderness on palpation, mild. Some tenderness in RLQ, mild.    Extremities: No lower extremity edema. No clubbing or deformities. Neuro: Alert and oriented x 4   Skin: Warm and dry, no jaundice.   Psych: Alert and cooperative, normal mood and affect.  Labs:  Lab Results  Component Value Date   CREATININE 0.80 01/26/2019   BUN 15 01/26/2019   NA 137 01/26/2019   K 3.5 01/26/2019   CL 103 01/26/2019   CO2 24 01/26/2019   Lab Results  Component Value Date   ALT 57 (H) 01/25/2019   AST 26 01/25/2019   ALKPHOS 77 01/25/2019   BILITOT 1.0 01/25/2019   Lab Results  Component Value Date   WBC 10.0 01/26/2019   HGB 17.3 (H) 01/26/2019   HCT 54.7 (H) 01/26/2019   MCV 91.5 01/26/2019   PLT 198 01/26/2019   Lab Results  Component Value Date   LIPASE 29 01/25/2019    Imaging Studies: No results found.

## 2019-03-10 NOTE — Patient Instructions (Signed)
1. CT scan today. We will call you with results.

## 2019-03-10 NOTE — Patient Instructions (Signed)
No PA needed for CT abd/pelvis w/contrast per [employer] website. 

## 2019-03-13 ENCOUNTER — Ambulatory Visit: Payer: 59 | Admitting: Family Medicine

## 2019-03-13 NOTE — Telephone Encounter (Signed)
Tissue transglutaminase antibody IgA

## 2019-03-13 NOTE — Telephone Encounter (Signed)
IgA order placed. Pt is aware. Pt will come by office before having blood work to pick up lab orders so lab will draw all test.

## 2019-03-13 NOTE — Telephone Encounter (Signed)
Unable to find ITTGA antibody. IgA antibody does come up for celiac disease. Please clarify. Thank you

## 2019-03-15 ENCOUNTER — Telehealth: Payer: 59 | Admitting: Cardiovascular Disease

## 2019-03-15 ENCOUNTER — Ambulatory Visit: Payer: 59 | Admitting: Family Medicine

## 2019-03-16 ENCOUNTER — Other Ambulatory Visit: Payer: Self-pay

## 2019-03-16 ENCOUNTER — Ambulatory Visit (INDEPENDENT_AMBULATORY_CARE_PROVIDER_SITE_OTHER): Payer: 59 | Admitting: General Surgery

## 2019-03-16 ENCOUNTER — Encounter: Payer: Self-pay | Admitting: General Surgery

## 2019-03-16 VITALS — BP 128/76 | HR 65 | Temp 98.2°F | Resp 16 | Ht 74.0 in | Wt 218.0 lb

## 2019-03-16 DIAGNOSIS — K5720 Diverticulitis of large intestine with perforation and abscess without bleeding: Secondary | ICD-10-CM

## 2019-03-19 NOTE — Progress Notes (Signed)
[facility] Note   HPI:  53 y.o. Male presents to clinic for follow-up evaluation after having his first episode of diverticulitis with small contained perforation, treated with antibiotics. He has been doing well and has no complaints or issues. He is going to get a colonoscopy in a few weeks per his report.   Review of Systems:  No fever or  Chills No pain  Regular BMs  All other review of systems: otherwise negative   Vital Signs:  BP 128/76 (BP Location: Left Arm, Patient Position: Sitting, Cuff Size: Normal)   Pulse 65   Temp 98.2 F (36.8 C) (Oral)   Resp 16   Ht 6\' 2"  (1.88 m)   Wt 218 lb (98.9 kg)   SpO2 95%   BMI 27.99 kg/m    Physical Exam:  Physical Exam Vitals reviewed.  HENT:     Head: Normocephalic.  Cardiovascular:     Rate and Rhythm: Normal rate.  Pulmonary:     Effort: Pulmonary effort is normal.  Abdominal:     General: There is no distension.     Palpations: Abdomen is soft.     Tenderness: There is no abdominal tenderness.  Neurological:     General: No focal deficit present.     Mental Status: He is alert.  Psychiatric:        Mood and Affect: Mood normal.        Behavior: Behavior normal.      Assessment:  53 y.o. yo Male with resolving diverticulitis. This was his first episode.   We have discussed that diverticulitis is a spectrum of disease. We discussed that it ranges from simple diverticulitis that can be treated with oral antibiotics as an outpatient to severe cases with perforations that require emergency surgery and colostomy.  We discussed that his case we slightly more complex due to the contained perforation and need for hospitailzation.  We discussed that surgery after diverticulitis is an individualized discussion and that we do not have to rush into a surgery, and can see if he develops future episodes. We discussed exercise and high fiber diet to prevent flares and discussed avoiding red meat and smoking.     He is going to see GI for a colonoscopy after his episode in the upcoming weeks.  We are available in the future if he continues to have issues.   All of the above recommendations were discussed with the patient, and all of patient's questions were answered to his expressed satisfaction.  Curlene Labrum, MD [facility] [address] [place], [place] [address] [phone] (office)

## 2019-04-13 ENCOUNTER — Encounter: Payer: Self-pay | Admitting: Gastroenterology

## 2019-04-13 ENCOUNTER — Ambulatory Visit: Payer: 59 | Admitting: Gastroenterology

## 2019-04-13 VITALS — BP 124/72 | HR 86 | Temp 97.6°F | Ht 74.0 in | Wt 218.0 lb

## 2019-04-13 DIAGNOSIS — K5732 Diverticulitis of large intestine without perforation or abscess without bleeding: Secondary | ICD-10-CM | POA: Diagnosis not present

## 2019-04-13 NOTE — Patient Instructions (Signed)
If you are age 53 or older, your body mass index should be between 23-30. Your Body mass index is 27.99 kg/m. If this is out of the aforementioned range listed, please consider follow up with your Primary Care Provider.  If you are age 3 or younger, your body mass index should be between 19-25. Your Body mass index is 27.99 kg/m. If this is out of the aformentioned range listed, please consider follow up with your Primary Care Provider.   Follow up as needed.   It was a pleasure to see you today!  Dr. Myrtie Neither

## 2019-04-13 NOTE — Progress Notes (Signed)
[facility] Progress Note  Chief Complaint: Diverticulitis  Subjective  History: Screening colonoscopy with me August 2018, normal except for left-sided diverticulosis. Admitted to [facility] late October with severe diverticulitis, CT scan showing microperforation.  Treated conservatively with bowel rest and antibiotics, surgical consultation was obtained but no surgery necessary. He contacted Dr. Kendell Bane of the [facility] group in late November with slowly worsening left lower quadrant pain, and was prescribed Cipro and Flagyl, and seen in their clinic on 03/10/2019. Was still having some intermittent pain, so repeat CT abdomen was obtained as below.  Meziah is feeling well these days, Donnell pain is resolved.  Much of the time he has 1 or 2 BMs per day, soon after having morning meal or coffee, but other times feels might miss a day and feel bloated.  This seems to vary with his diet, stress and level of activity. He denies rectal bleeding, eats a healthy diet with plenty of fiber, low-fat lean protein.  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  ROS: Cardiovascular:  no chest pain Respiratory: no dyspnea Remainder of systems negative except as above  The patient's Past Medical, Family and Social History were reviewed and are on file in the EMR.  Objective:  Med list reviewed  Current Outpatient Medications:  .  ALPRAZolam (XANAX) 1 MG tablet, Take one tablet po prn TID for anxiety ; use sparingly, must last one month, Disp: 30 tablet, Rfl: 2 .  Ascorbic Acid (VITAMIN C) 1000 MG tablet, Take 1,000 mg by mouth daily., Disp: , Rfl:  .  Multiple Vitamin (MULTIVITAMIN WITH MINERALS) TABS tablet, Take 1 tablet by mouth daily., Disp:  , Rfl:  .  testosterone cypionate (DEPOTESTOSTERONE CYPIONATE) 200 MG/ML injection, Inject 100 mg into the muscle every Friday. , Disp: , Rfl:    Vital signs in last 24  hrs: Vitals:   04/13/19 1452  BP: 124/72  Pulse: 86  Temp: 97.6 F (36.4 C)  SpO2: 98%    Physical Exam  Well-appearing  HEENT: sclera anicteric, oral mucosa moist without lesions  Neck: supple, no thyromegaly, JVD or lymphadenopathy  Cardiac: RRR without murmurs, S1S2 heard, no peripheral edema  Pulm: clear to auscultation bilaterally, normal RR and effort noted  Abdomen: soft, no tenderness, with active bowel sounds. No guarding or palpable hepatosplenomegaly.  Skin; warm and dry, no jaundice or rash  Recent Labs:  CBC Latest Ref Rng & Units 01/26/2019 01/25/2019 05/02/2018  WBC 4.0 - 10.5 K/uL 10.0 12.8(H) 6.3  Hemoglobin 13.0 - 17.0 g/dL 17.3(H) 17.4(H) 17.1  Hematocrit 39.0 - 52.0 % 54.7(H) 55.7(H) 50.2  Platelets 150 - 400 K/uL 198 216 265   CMP Latest Ref Rng & Units 01/26/2019 01/25/2019 05/02/2018  Glucose 70 - 99 mg/dL 91 774(J) 84  BUN 6 - 20 mg/dL 15 15 24   Creatinine 0.61 - 1.24 mg/dL 2.87 8.67  Sodium 135 - 145 mmol/L 137 136 139  Potassium 3.5 - 5.1 mmol/L 3.5 3.8 4.2  Chloride 98 - 111 mmol/L 103 100 103  CO2 22 - 32 mmol/L 24 27 16(L)  Calcium 8.9 - 10.3 mg/dL 6.72) 0.9(O) 9.0  Total Protein 6.5 - 8.1 g/dL - 7.2 7.0  Total Bilirubin 0.3 - 1.2 mg/dL - 1.0 0.3  Alkaline Phos 38 - 126 U/L - 77 94  AST 15 - 41 U/L - 26 41(H)  ALT 0 - 44 U/L -  57(H) 53(H)     Radiologic studies:  CLINICAL DATA:  54 year old male with perforated sigmoid diverticulitis in October. Recurrent left lower quadrant pain.   EXAM: CT ABDOMEN AND PELVIS WITH CONTRAST   TECHNIQUE: Multidetector CT imaging of the abdomen and pelvis was performed using the standard protocol following bolus administration of intravenous contrast.   CONTRAST:  144mL OMNIPAQUE IOHEXOL 300 MG/ML  SOLN   COMPARISON:  CT Abdomen and Pelvis 01/25/2019.   FINDINGS: Lower chest: Negative.   Hepatobiliary: Stable and negative liver and gallbladder.   Pancreas: Negative.   Spleen:  Negative.   Adrenals/Urinary Tract: Normal adrenal glands.   Symmetric and normal renal enhancement and contrast excretion. Multifocal punctate left lower pole nephrolithiasis (coronal images 59 and 62. Negative left ureter. Diminutive and unremarkable bladder.   Stomach/Bowel: Negative rectum. Redundant sigmoid colon. The mid sigmoid along the left pelvic side wall was most abnormal in October and has a more normal appearance now (series 2 images 75 through 29). Mild residual soft tissue thickening along the left side wall with no fluid collection or abscess. Diverticulosis continues toward the descending colon but no active inflammation identified at this time.   Oral contrast has reached the descending colon and is mixed with stool upstream. Redundant transverse colon. Normal retrocecal appendix. No large bowel inflammation identified today.   Negative terminal ileum. Small bowel is within normal limits. Negative stomach and duodenum. No free air. No free fluid.   Vascular/Lymphatic: Suboptimal intravascular contrast today but the major arterial structures appear patent. Portal venous system is patent. No lymphadenopathy.   Reproductive: Negative.   Other: No pelvic free fluid.   Musculoskeletal: Stable lower lumbar spine and hip degeneration. No acute osseous abnormality identified.   IMPRESSION: 1. Essentially resolved sigmoid diverticulitis since October with no complicating features. No active inflammation identified today. 2. No other acute or inflammatory process in the abdomen or pelvis. Increased retained stool in the colon. 3. Punctate left nephrolithiasis with no obstructive uropathy.     Electronically Signed   By: Genevie Ann M.D.   On: 03/10/2019 12:44  (CT images from both December and October were personally reviewed) @ASSESSMENTPLANBEGIN @ Assessment: Encounter Diagnosis  Name Primary?  . Diverticulitis of colon Yes   This was his first episode of  diverticulitis, and while it was on the more severe and complicated end of the spectrum, that seems likely due to the fact that it had most like to been going on a few weeks before he came to medical attention.  He was having concomitant back pain from some muscle strain and thought the abdominal pain was related to that as well.  While he required prolonged antibiotic course and had a 4 to 6-week recovery, I believe the episode is completely resolved at this point.  He is eating healthy diet, bowel habits generally regular, he has no rectal bleeding or red flag signs to suggest malignancy.  No polyps on colonoscopy August 2018.  He had been told by both GI and surgical consultants in the hospital that he should have a colonoscopy because of this episode.  I disagree, and think that a colonoscopy would be of low yield at this point.  This was his first episode, it has now resolved, all entire clinical picture including imaging were consistent with diverticulitis. We also discussed how the historic diverticulitis related dietary restrictions have no data to support them.  Plan: Blythe will continue his current healthy diet and lifestyle management and see me as needed.  If he has recurrent pain that he feels is reminiscent of the diverticulitis, he will call me and perhaps need another course of antibiotics.  If that occurs and does not get better with empiric therapy, may need repeat imaging.   Total time 30 minutes, over half spent face-to-face with patient in counseling and coordination of care.   Charlie Pitter III

## 2019-04-14 ENCOUNTER — Other Ambulatory Visit: Payer: Self-pay | Admitting: Family Medicine

## 2019-04-19 ENCOUNTER — Ambulatory Visit (INDEPENDENT_AMBULATORY_CARE_PROVIDER_SITE_OTHER): Payer: 59 | Admitting: Family Medicine

## 2019-04-19 ENCOUNTER — Other Ambulatory Visit: Payer: Self-pay

## 2019-04-19 DIAGNOSIS — Z20822 Contact with and (suspected) exposure to covid-19: Secondary | ICD-10-CM

## 2019-04-19 DIAGNOSIS — R0981 Nasal congestion: Secondary | ICD-10-CM | POA: Diagnosis not present

## 2019-04-19 NOTE — Progress Notes (Signed)
   Subjective:  Audiovideo  Patient ID: Nicholas Sharp, male    DOB: 1965/05/06, 54 y.o.   MRN: [id number]  Sore Throat  This is a new problem. The current episode started yesterday. Associated symptoms comments: Runny nose, congestion, drainage, right earache . He has tried nothing for the symptoms.   Virtual Visit via Video Note  I connected with Nicholas Sharp on 04/19/19 at  3:50 PM EST by a video enabled telemedicine application and verified that I am speaking with the correct person using two identifiers.  Location: Patient: home Provider: office   I discussed the limitations of evaluation and management by telemedicine and the availability of in person appointments. The patient expressed understanding and agreed to proceed.  History of Present Illness:    Observations/Objective:   Assessment and Plan:   Follow Up Instructions:    I discussed the assessment and treatment plan with the patient. The patient was provided an opportunity to ask questions and all were answered. The patient agreed with the plan and demonstrated an understanding of the instructions.   The patient was advised to call back or seek an in-person evaluation if the symptoms worsen or if the condition fails to improve as anticipated.  I provided 20 minutes of non-face-to-face time during this encounter.  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  Mild tickle in the throat.  Some drainage intermittently.      Review of Systems No vomiting no diarrhea    Objective:   Physical Exam   Virtual     Assessment & Plan:  Impression viral syndrome.  Potentially COVID-19.  Discussed.  Warning signs discussed.  Testing encouraged.  Isolation pending results rationale discussed questions answered

## 2019-05-03 ENCOUNTER — Encounter: Payer: Self-pay | Admitting: Family Medicine

## 2019-05-04 ENCOUNTER — Ambulatory Visit (INDEPENDENT_AMBULATORY_CARE_PROVIDER_SITE_OTHER): Payer: 59 | Admitting: Family Medicine

## 2019-05-04 ENCOUNTER — Other Ambulatory Visit: Payer: Self-pay

## 2019-05-04 VITALS — BP 128/82 | Temp 97.9°F | Wt 221.4 lb

## 2019-05-04 DIAGNOSIS — R21 Rash and other nonspecific skin eruption: Secondary | ICD-10-CM

## 2019-05-04 NOTE — Progress Notes (Signed)
   Subjective:    Patient ID: Nicholas Sharp, male    DOB: May 15, 1965, 54 y.o.   MRN: [id number]  HPI  Patient arrives with bite to right lower arm . Patient noticed the bite yesterday but it had a red ring around it this am.   Developed a bite  No pain and tnd   Patient notes that discomfort has improved somewhat at this point today.  He did get out of school hunting this weekend so may well been exposed to a spider or other insect  Also notes a tiny nodule in his scrotum that has been worrying him for a while  Review of Systems No headache, no major weight loss or weight gain, no chest pain no back pain abdominal pain no change in bowel habits complete ROS otherwise negative     Objective:   Physical Exam  Alert vitals stable, NAD. Blood pressure good on repeat. HEENT normal. Lungs clear. Heart regular rate and rhythm. Tiny bite-like mark on the OR right wrist with the surrounding patch of erythema not tender scrotal subcuticular region reveals a tiny cyst palpable in the subcutaneous region      Assessment & Plan:  Impression 1 benign cyst of scrotum reassured  2.  Nonvenomous bite could well have been a spider warning signs discussed

## 2019-05-05 ENCOUNTER — Encounter: Payer: Self-pay | Admitting: Family Medicine

## 2019-05-13 LAB — CBC WITH DIFFERENTIAL/PLATELET
Basophils Absolute: 0 10*3/uL (ref 0.0–0.2)
Basos: 1 %
EOS (ABSOLUTE): 0.1 10*3/uL (ref 0.0–0.4)
Eos: 2 %
Hematocrit: 53.3 % — ABNORMAL HIGH (ref 37.5–51.0)
Hemoglobin: 18.4 g/dL — ABNORMAL HIGH (ref 13.0–17.7)
Immature Grans (Abs): 0 10*3/uL (ref 0.0–0.1)
Immature Granulocytes: 0 %
Lymphocytes Absolute: 1.5 10*3/uL (ref 0.7–3.1)
Lymphs: 33 %
MCH: 30.1 pg (ref 26.6–33.0)
MCHC: 34.5 g/dL (ref 31.5–35.7)
MCV: 87 fL (ref 79–97)
Monocytes Absolute: 0.5 10*3/uL (ref 0.1–0.9)
Monocytes: 12 %
Neutrophils Absolute: 2.4 10*3/uL (ref 1.4–7.0)
Neutrophils: 52 %
Platelets: 240 10*3/uL (ref 150–450)
RBC: 6.11 x10E6/uL — ABNORMAL HIGH (ref 4.14–5.80)
RDW: 13 % (ref 11.6–15.4)
WBC: 4.5 10*3/uL (ref 3.4–10.8)

## 2019-05-13 LAB — HEPATIC FUNCTION PANEL
ALT: 40 IU/L (ref 0–44)
AST: 37 IU/L (ref 0–40)
Albumin: 4.3 g/dL (ref 3.8–4.9)
Alkaline Phosphatase: 92 IU/L (ref 39–117)
Bilirubin Total: 0.7 mg/dL (ref 0.0–1.2)
Bilirubin, Direct: 0.17 mg/dL (ref 0.00–0.40)
Total Protein: 6.6 g/dL (ref 6.0–8.5)

## 2019-05-13 LAB — LIPID PANEL
Chol/HDL Ratio: 4.8 ratio (ref 0.0–5.0)
Cholesterol, Total: 188 mg/dL (ref 100–199)
HDL: 39 mg/dL — ABNORMAL LOW (ref >39)
LDL Chol Calc (NIH): 131 mg/dL — ABNORMAL HIGH (ref 0–99)
Triglycerides: 96 mg/dL (ref 0–149)
VLDL Cholesterol Cal: 18 mg/dL (ref 5–40)

## 2019-05-13 LAB — BASIC METABOLIC PANEL
BUN/Creatinine Ratio: 21 — ABNORMAL HIGH (ref 9–20)
BUN: 15 mg/dL (ref 6–24)
CO2: 24 mmol/L (ref 20–29)
Calcium: 9.1 mg/dL (ref 8.7–10.2)
Chloride: 104 mmol/L (ref 96–106)
Creatinine, Ser: 0.72 mg/dL — ABNORMAL LOW (ref 0.76–1.27)
GFR calc Af Amer: 123 mL/min/{1.73_m2} (ref >59)
GFR calc non Af Amer: 107 mL/min/{1.73_m2} (ref >59)
Glucose: 82 mg/dL (ref 65–99)
Potassium: 4.4 mmol/L (ref 3.5–5.2)
Sodium: 144 mmol/L (ref 134–144)

## 2019-05-13 LAB — PSA: Prostate Specific Ag, Serum: 1.1 ng/mL (ref 0.0–4.0)

## 2019-05-14 ENCOUNTER — Encounter: Payer: Self-pay | Admitting: Family Medicine

## 2019-05-14 LAB — TISSUE TRANSGLUTAMINASE, IGA: Transglutaminase IgA: <2 U/mL (ref 0–3)

## 2019-07-05 ENCOUNTER — Telehealth: Payer: Self-pay | Admitting: Family Medicine

## 2019-07-05 NOTE — Telephone Encounter (Signed)
Patient found a tick on his bottom today and doesn't know how long its been back their he wants to know your thoughts on this? He uses [facility]- [address]

## 2019-07-05 NOTE — Telephone Encounter (Signed)
Pt states he is not having any fever, headache, joint pain or bodyaches, no symptoms. Does not have a rash around it or any signs of infection that he can tell but it is a hard area to see. He had to use a mirror to pull of tick. He states a couple of times in the past dr Brett Canales sent in antibiotic just to be safe for him after getting tick bites. Pt states he will be outside and its ok to leave message on his voicemail.

## 2019-07-06 ENCOUNTER — Other Ambulatory Visit: Payer: Self-pay | Admitting: *Deleted

## 2019-07-06 MED ORDER — DOXYCYCLINE HYCLATE 100 MG PO TABS: 100.0000 mg | ORAL_TABLET | Freq: Two times a day (BID) | ORAL | 0 refills | Status: DC

## 2019-07-06 NOTE — Telephone Encounter (Signed)
Left message for pt to return call.

## 2019-07-06 NOTE — Telephone Encounter (Signed)
Discussed with pt and he states the area is now a little swollen and he does want doxy sent in. Med sent to pharm.

## 2019-07-06 NOTE — Telephone Encounter (Signed)
Let him know experts these days are recommending with complete abscence of symtoms, not to treat. If he is not comfortable with this, can call in doxy 100 bid ten d

## 2019-07-07 ENCOUNTER — Other Ambulatory Visit: Payer: Self-pay | Admitting: Family Medicine

## 2019-07-10 NOTE — Telephone Encounter (Signed)
6 mo worth  

## 2019-08-09 ENCOUNTER — Other Ambulatory Visit: Payer: Self-pay

## 2019-08-09 ENCOUNTER — Ambulatory Visit: Payer: 59 | Admitting: Family Medicine

## 2019-08-09 VITALS — BP 128/84 | HR 59 | Temp 96.9°F | Wt 216.0 lb

## 2019-08-09 DIAGNOSIS — M5481 Occipital neuralgia: Secondary | ICD-10-CM

## 2019-08-09 NOTE — Progress Notes (Signed)
Patient ID: Nicholas Sharp, male    DOB: Oct 13, 1965, 54 y.o.   MRN: [id number]   Chief Complaint  Patient presents with  . Headache   Subjective:    HPI  Patient comes in today with complaints of pain on the right side of his head that radiates toward his neck for a couple of days, "on and off."  Patient states he has had this happened in the past and "nothing was figured out."   Intermittent head pain, that is sharp and lasting 1-2 seconds and resolving on it's own.  Feels like someone "thumped him on the head." On back right of head and radiates down to the neck. No rash or skin changes.  No injury to head or neck.  Doesn't recall resting head in unusual position.  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  No visual changes, frontal headache, nausea, vomiting, photophobia, or numbness and tingling.  Per chart has had this similar pain in past in 2016.  Had MRI brain and was normal. Was seen by Dr. Claudius Sis in past.  Pt also mentioning that he stopped taking testosterone.  Only using xanax prn. And is interested in tapering off this.   Medical History Nicholas Sharp has a past medical history of ADHD (attention deficit hyperactivity disorder), Alcohol consumption heavy, Back injury, DDD (degenerative disc disease), Diverticulitis, ED (erectile dysfunction), GERD (gastroesophageal reflux disease), Hyperlipidemia, and Insomnia.   Outpatient Encounter Medications as of 08/09/2019  Medication Sig  . ALPRAZolam (XANAX) 1 MG tablet TAKE 1 TABLET BY MOUTH THREE TIMES DAILY AS NEEDED FOR ANXIETY  . Ascorbic Acid (VITAMIN C) 1000 MG tablet Take 1,000 mg by mouth daily.  Marland Kitchen doxycycline (VIBRA-TABS) 100 MG tablet Take 1 tablet (100 mg total) by mouth 2 (two) times daily.  . Multiple Vitamin (MULTIVITAMIN WITH MINERALS) TABS tablet Take 1 tablet by mouth daily.  Marland Kitchen testosterone cypionate (DEPOTESTOSTERONE CYPIONATE) 200 MG/ML injection Inject 100 mg into  the muscle every Friday.    No facility-administered encounter medications on file as of 08/09/2019.     Review of Systems  Constitutional: Negative for chills and fever.  HENT: Negative for congestion, ear discharge, ear pain, rhinorrhea, sinus pressure, sinus pain, sneezing and sore throat.   Eyes: Negative for photophobia, pain, discharge, redness, itching and visual disturbance.  Gastrointestinal: Negative for diarrhea, nausea and vomiting.  Musculoskeletal: Negative for back pain, neck pain and neck stiffness.  Skin: Negative for rash and wound.  Neurological: Positive for headaches. Negative for dizziness, tremors, syncope, facial asymmetry, speech difficulty, weakness and numbness.     Vitals BP 128/84   Pulse (!) 59   Temp (!) 96.9 F (36.1 C)   Wt 216 lb (98 kg)   SpO2 98%   BMI 27.73 kg/m   Objective:   Physical Exam Vitals and nursing note reviewed.  Constitutional:      General: He is not in acute distress.    Appearance: Normal appearance. He is not ill-appearing or toxic-appearing.  HENT:     Head: Normocephalic.     Comments: +ttp over right occipital area, no mass, nodule, rash, or erythema on scalp.     Nose: Nose normal. No congestion.     Mouth/Throat:     Mouth: Mucous membranes are moist.     Pharynx: No oropharyngeal exudate.  Eyes:     Extraocular Movements: Extraocular movements intact.     Conjunctiva/sclera: Conjunctivae normal.  Pupils: Pupils are equal, round, and reactive to light.  Cardiovascular:     Rate and Rhythm: Normal rate and regular rhythm.     Pulses: Normal pulses.     Heart sounds: Normal heart sounds. No murmur.  Pulmonary:     Effort: Pulmonary effort is normal.     Breath sounds: Normal breath sounds. No wheezing, rhonchi or rales.  Musculoskeletal:        General: Normal range of motion.     Cervical back: Normal range of motion and neck supple.     Right lower leg: No edema.     Left lower leg: No edema.    Lymphadenopathy:     Cervical: No cervical adenopathy.  Skin:    General: Skin is warm and dry.     Findings: No rash.  Neurological:     General: No focal deficit present.     Mental Status: He is alert and oriented to person, place, and time.     Cranial Nerves: No cranial nerve deficit or facial asymmetry.     Sensory: No sensory deficit.     Motor: No weakness.     Comments: Normal CN 2-12 grossly intact.  Psychiatric:        Mood and Affect: Mood normal.        Behavior: Behavior normal.      Assessment and Plan   1. Occipital neuralgia of right side   Likely occipital neuralgia.  Reviewed in chart similar symptoms with Dr. Kem Parkinson in 2015.  The pain is very infrequent going on for 2 days intermittently and not needing to take medication for it.  Reviewed MRI of brain 2015 which was normal and head CT 2009- normal. Advising to monitor if worsening would refer to neuro for evaluation/treatment.  Gave reassurance and return precautions for headaches.   Pt in agreement.   F/u prn.

## 2019-08-09 NOTE — Patient Instructions (Signed)
Occipital Neuralgia  Occipital neuralgia is a type of headache that causes brief episodes of very bad pain in the back of your head. Pain from occipital neuralgia may spread (radiate) to other parts of your head. These headaches may be caused by irritation of the nerves that leave your spinal cord high up in your neck, just below the base of your skull (occipital nerves). Your occipital nerves transmit sensations from the back of your head, the top of your head, and the areas behind your ears. What are the causes? This condition can occur without any known cause (primary headache syndrome). In other cases, this condition is caused by pressure on or irritation of one of the two occipital nerves. Pressure and irritation may be due to:  Muscle spasm in the neck.  Neck injury.  Wear and tear of the vertebrae in the neck (osteoarthritis).  Disease of the disks that separate the vertebrae.  Swollen blood vessels that put pressure on the occipital nerves.  Infections.  Tumors.  Diabetes. What are the signs or symptoms? This condition causes brief burning, stabbing, electric, shocking, or shooting pain which can radiate to the top of the head. It can happen on one side or both sides of the head. It can also cause:  Pain behind the eye.  Pain triggered by neck movement or hair brushing.  Scalp tenderness.  Aching in the back of the head between episodes of very bad pain.  Pain gets worse with exposure to bright lights. How is this diagnosed? There is no test that diagnoses this condition. Your health care provider may diagnose this condition based on a physical exam and your symptoms. Other tests may be done, such as:  Imaging studies of the brain and neck (cervical spine), such as an MRI or CT scan. These look for causes of pinched nerves.  Applying pressure to the nerves in the neck to try to re-create the pain.  Injection of numbing medicine into the occipital nerve areas to see if  pain goes away (diagnostic nerve block). How is this treated? Treatment for this condition may begin with simple measures, such as:  Rest.  Massage.  Applying heat or cold on the area.  Over-the-counter pain relievers. If these measures do not work, you may need other treatments, including:  Medicines, such as: ? Prescription-strength anti-inflammatory medicines. ? Muscle relaxants. ? Anti-seizure medicines, which can relieve pain. ? Antidepressants, which can relieve pain. ? Injected medicines, such as medicines that numb the area (local anesthetic) and steroids.  Pulsed radiofrequency ablation. This is when wires are implanted to deliver electrical impulses that block pain signals from the occipital nerve.  Surgery to relieve nerve pressure.  Physical therapy. Follow these instructions at home: Pain management      Avoid any activities that cause pain.  Rest when you have an attack of pain.  Try gentle massage to relieve pain.  Try a different pillow or sleeping position.  If directed, apply heat to the affected area as told by your health care provider. Use the heat source that your health care provider recommends, such as a moist heat pack or a heating pad. ? Place a towel between your skin and the heat source. ? Leave the heat on for 20-30 minutes. ? Remove the heat if your skin turns bright red. This is especially important if you are unable to feel pain, heat, or cold. You may have a greater risk of getting burned.  If directed, apply ice to the   back of the head and neck area as told by your health care provider. ? Put ice in a plastic bag. ? Place a towel between your skin and the bag. ? Leave the ice on for 20 minutes, 2-3 times per day. General instructions  Take over-the-counter and prescription medicines only as told by your health care provider.  Avoid things that make your symptoms worse, such as bright lights.  Try to stay active. Get regular  exercise that does not cause pain. Ask your health care provider to suggest safe exercises for you.  Work with a physical therapist to learn stretching exercises you can do at home.  Practice good posture.  Keep all follow-up visits as told by your health care provider. This is important. Contact a health care provider if:  Your medicine is not working.  You have new or worsening symptoms. Get help right away if:  You have very bad head pain that does not go away.  You have a sudden change in vision, balance, or speech. Summary  Occipital neuralgia is a type of headache that causes brief episodes of very bad pain in the back of your head.  Pain from occipital neuralgia may spread (radiate) to other parts of your head.  Treatment for this condition includes rest, massage, and medicines. This information is not intended to replace advice given to you by your health care provider. Make sure you discuss any questions you have with your health care provider. Document Revised: 03/02/2017 Document Reviewed: 05/21/2016 Elsevier Patient Education  2020 Elsevier Inc.  

## 2019-08-10 ENCOUNTER — Encounter: Payer: Self-pay | Admitting: Family Medicine

## 2019-09-22 ENCOUNTER — Other Ambulatory Visit: Payer: Self-pay

## 2019-09-22 ENCOUNTER — Encounter: Payer: Self-pay | Admitting: Family Medicine

## 2019-09-22 ENCOUNTER — Telehealth: Payer: Self-pay | Admitting: Internal Medicine

## 2019-09-22 ENCOUNTER — Ambulatory Visit (INDEPENDENT_AMBULATORY_CARE_PROVIDER_SITE_OTHER): Payer: 59 | Admitting: Family Medicine

## 2019-09-22 ENCOUNTER — Telehealth: Payer: Self-pay | Admitting: Gastroenterology

## 2019-09-22 VITALS — BP 122/74 | HR 98 | Temp 97.5°F | Ht 74.0 in | Wt 218.0 lb

## 2019-09-22 DIAGNOSIS — K5792 Diverticulitis of intestine, part unspecified, without perforation or abscess without bleeding: Secondary | ICD-10-CM

## 2019-09-22 MED ORDER — CIPROFLOXACIN HCL 500 MG PO TABS: 500.0000 mg | ORAL_TABLET | Freq: Two times a day (BID) | ORAL | 0 refills | Status: DC

## 2019-09-22 MED ORDER — METRONIDAZOLE 500 MG PO TABS: 500.0000 mg | ORAL_TABLET | Freq: Two times a day (BID) | ORAL | 0 refills | Status: DC

## 2019-09-22 NOTE — Progress Notes (Signed)
Patient ID: Nicholas Sharp, male    DOB: 12-26-1965, 54 y.o.   MRN: [id number]   Chief Complaint  Patient presents with  . Abdominal Pain    4-5 days-history of diverticulitis- see message for GI   Subjective:    HPI Pt having left lower quad pain, and some generalized lower abd pain.   Feeling "bubbly" in abdomen.  4-5 days feeling this. Called GI this AM, but they weren't in office. Feeling bad all over.  Has some chills 2-3 days ago. No fever.  Diarrhea this Am, 1x.   No vomoting.  No blood in stool.  Seeing Dr. Myrtie Neither- GI, and if diverticulitis, then recommending cipro and flagyl.   Pt not taking xanax anymore. Pt was able to taper off and discontinue. Changed his diet and exercising. Had good appetite.  Drinking 6 beers per week, which was a decrease from his heavy drinking in the past.  Medical History [place] has a past medical history of Acute diverticulitis (01/25/2019), ADHD (attention deficit hyperactivity disorder), Alcohol consumption heavy, Back injury, DDD (degenerative disc disease), Diverticulitis, ED (erectile dysfunction), GERD (gastroesophageal reflux disease), Hyperlipidemia, and Insomnia.   Outpatient Encounter Medications as of 09/22/2019  Medication Sig  . Ascorbic Acid (VITAMIN C) 1000 MG tablet Take 1,000 mg by mouth daily. (Patient not taking: Reported on 09/22/2019)  . ciprofloxacin (CIPRO) 500 MG tablet Take 1 tablet (500 mg total) by mouth 2 (two) times daily.  Marland Kitchen doxycycline (VIBRA-TABS) 100 MG tablet Take 1 tablet (100 mg total) by mouth 2 (two) times daily. (Patient not taking: Reported on 09/22/2019)  . metroNIDAZOLE (FLAGYL) 500 MG tablet Take 1 tablet (500 mg total) by mouth 2 (two) times daily.  . Multiple Vitamin (MULTIVITAMIN WITH MINERALS) TABS tablet Take 1 tablet by mouth daily.  . [DISCONTINUED] ALPRAZolam (XANAX) 1 MG tablet TAKE 1 TABLET BY MOUTH THREE TIMES DAILY AS NEEDED FOR ANXIETY (Patient not taking: Reported on 09/22/2019)   No  facility-administered encounter medications on file as of 09/22/2019.     Review of Systems  Constitutional: Positive for chills. Negative for fever.  HENT: Negative for congestion, rhinorrhea and sore throat.   Respiratory: Negative for cough, shortness of breath and wheezing.   Cardiovascular: Negative for chest pain and leg swelling.  Gastrointestinal: Positive for abdominal pain and diarrhea. Negative for anal bleeding, blood in stool, constipation, nausea and vomiting.  Genitourinary: Negative for dysuria and frequency.  Skin: Negative for rash.  Neurological: Negative for dizziness, weakness and headaches.     Vitals BP 122/74   Pulse 98   Temp (!) 97.5 F (36.4 C) (Oral)   Ht 6\' 2"  (1.88 m)   Wt 218 lb (98.9 kg)   SpO2 99%   BMI 27.99 kg/m   Objective:   Physical Exam Vitals reviewed.  Constitutional:      Appearance: Normal appearance.  HENT:     Head: Normocephalic.     Nose: Nose normal. No congestion.     Mouth/Throat:     Pharynx: No oropharyngeal exudate.  Eyes:     Conjunctiva/sclera: Conjunctivae normal.  Cardiovascular:     Rate and Rhythm: Normal rate and regular rhythm.     Pulses: Normal pulses.     Heart sounds: Normal heart sounds. No murmur heard.   Pulmonary:     Effort: Pulmonary effort is normal.     Breath sounds: Normal breath sounds. No wheezing, rhonchi or rales.  Abdominal:     General: Bowel sounds are  normal.     Palpations: Abdomen is soft. There is no shifting dullness, fluid wave, hepatomegaly, splenomegaly or mass.     Tenderness: There is abdominal tenderness in the left lower quadrant. There is no guarding or rebound. Negative signs include Murphy's sign and McBurney's sign.     Hernia: No hernia is present.  Musculoskeletal:        General: Normal range of motion.     Right lower leg: No edema.     Left lower leg: No edema.  Skin:    General: Skin is warm and dry.     Findings: No rash.  Neurological:     General: No  focal deficit present.     Mental Status: He is alert and oriented to person, place, and time.  Psychiatric:        Mood and Affect: Mood normal.        Behavior: Behavior normal.      Assessment and Plan   1. Diverticulitis - ciprofloxacin (CIPRO) 500 MG tablet; Take 1 tablet (500 mg total) by mouth 2 (two) times daily.  Dispense: 20 tablet; Refill: 0 - metroNIDAZOLE (FLAGYL) 500 MG tablet; Take 1 tablet (500 mg total) by mouth 2 (two) times daily.  Dispense: 20 tablet; Refill: 0   Call or go to ER if worsening abd pain, fever, or blood in stool. Pt to take meds until finished.  Pt in agreement.  F/u prn.

## 2019-09-22 NOTE — Telephone Encounter (Signed)
I called and spoke with the pt, he is going to call Dr.Danis and he also has an appointment with his pcp.

## 2019-09-22 NOTE — Telephone Encounter (Signed)
Spoke with pt and he is aware and will see PCP this afternoon.

## 2019-09-22 NOTE — Telephone Encounter (Signed)
Hello,   I saw Nicholas Sharp and treated him for diverticulitis.  Seems to be mild discomfort at this point.   Gave cipro and flagyl for 10 days.  Also advised to call if not improving.  Regards,   Sahvannah Rieser

## 2019-09-22 NOTE — Telephone Encounter (Signed)
Bonita Quin,    He has an appointment with his PCP today, which is helpful since I am doing procedures this afternoon.  I have copied Dr. Ladona Ridgel on this so she can know to treat Mr. Pecore with ciprofloxacin and metronidazole if exam suggests diverticulitis.  Please encourage him to contact us if not improving with whatever treatment is given.  There is a physician on call for the weekend, and office will be back open next Monday AM.  Dr. Ladona Ridgel,    Please see message stream and my office note from earlier this year.  Thanks   - Amada Jupiter, MD    [facility]

## 2019-09-22 NOTE — Telephone Encounter (Signed)
Left message for pt to call back.  Pt states 3-4 days ago he started having LLQ abd pain. States his stomach has been "bubbling" and he is having diarrhea. Pt does not think he has a fever. Pt calling for something to be called in for flare. Please advise.

## 2019-09-22 NOTE — Telephone Encounter (Signed)
Patient texted me last evening concerned about recurrent lower abdominal pain reminiscent of his similar symptoms related to diverticulitis. I recommended he make contact with Dr. Myrtie Neither, who is now actively following him.

## 2019-09-22 NOTE — Patient Instructions (Signed)

## 2019-09-26 NOTE — Telephone Encounter (Signed)
Patient notified

## 2019-09-26 NOTE — Telephone Encounter (Signed)
Pls call pt to see if pain improved after antibiotics.   Thx.   Dr. Ladona Ridgel

## 2019-09-26 NOTE — Telephone Encounter (Signed)
Left message to return call 

## 2019-09-26 NOTE — Telephone Encounter (Signed)
If pain is persisting then f/u with GI.  Take tylenol prn for pain.  Thx.   Dr. Ladona Ridgel

## 2019-09-26 NOTE — Telephone Encounter (Signed)
Patient states he has had improvement but still having some pains on and off in the middle of his abdomen.

## 2019-09-26 NOTE — Telephone Encounter (Signed)
Sounds good - thanks for the input.  - HD

## 2019-09-29 ENCOUNTER — Other Ambulatory Visit: Payer: Self-pay

## 2019-09-29 ENCOUNTER — Other Ambulatory Visit (INDEPENDENT_AMBULATORY_CARE_PROVIDER_SITE_OTHER): Payer: 59

## 2019-09-29 ENCOUNTER — Ambulatory Visit (INDEPENDENT_AMBULATORY_CARE_PROVIDER_SITE_OTHER)
Admission: RE | Admit: 2019-09-29 | Discharge: 2019-09-29 | Disposition: A | Discharge status: Home / Self Care | Payer: 59 | Source: Ambulatory Visit | Attending: Gastroenterology | Admitting: Gastroenterology

## 2019-09-29 ENCOUNTER — Telehealth: Payer: Self-pay | Admitting: Gastroenterology

## 2019-09-29 DIAGNOSIS — K5732 Diverticulitis of large intestine without perforation or abscess without bleeding: Secondary | ICD-10-CM

## 2019-09-29 LAB — BUN: BUN: 21 mg/dL (ref 6–23)

## 2019-09-29 LAB — CREATININE, SERUM: Creatinine, Ser: 0.85 mg/dL (ref 0.40–1.50)

## 2019-09-29 MED ORDER — IOHEXOL 300 MG/ML  SOLN
100.0000 mL | Freq: Once | INTRAMUSCULAR | Status: AC | PRN
  Administered 2019-09-29: 100 mL via INTRAVENOUS

## 2019-09-29 NOTE — Telephone Encounter (Signed)
Pt called on 6/25 having a diverticulitis flare. He also called his PCP and was seen by PCP. Pt was given cipro 500 bid for 10days and flagyl 500 bid for 10 days. Dr. Myrtie Neither wanted pt to call our office back if not better. Pt calling this am and states that he is having abd pains in different areas of his abd, not just LLQ. He reports having pain in his RLQ that runs down to his groin. He states he just really does not feel good. Pt has 2 more days of antibiotics to take. Please advise.

## 2019-09-29 NOTE — Telephone Encounter (Signed)
Nicholas Sharp,  I understand and am sorry we do not have office appointments available soon.  I certainly want to help him feel better, and am concerned that there may be another diagnosis or that the diverticulitis may have escalated in severity.  He needs a CT abdomen and pelvis with oral and IV contrast TODAY with report called to [place] by radiology.  If the CT scan cannot be arranged as outpatient today, then my advice is that he go to the emergency department to be evaluated.  I will also make the weekend on call physician aware in case they need to hear about it.  --------------  Clifton James   - HD

## 2019-09-29 NOTE — Telephone Encounter (Signed)
Pt scheduled for CT of A/P at [facility] CT today at 2:30pm, Pt to arrive there at 2:15pm. Pt to have no solid food after 10:30am. Pt needs to come by our office to have labs drawn and pick up contrast. Pt to drink bottle 1 of contrast at 12:30pm, bottle 2 at 1:30pm. Pt aware of appt and call report requested.

## 2020-01-25 ENCOUNTER — Other Ambulatory Visit: Payer: Self-pay

## 2020-01-25 ENCOUNTER — Ambulatory Visit (INDEPENDENT_AMBULATORY_CARE_PROVIDER_SITE_OTHER): Payer: 59 | Admitting: Family Medicine

## 2020-01-25 ENCOUNTER — Telehealth: Payer: Self-pay

## 2020-01-25 ENCOUNTER — Encounter: Payer: Self-pay | Admitting: Family Medicine

## 2020-01-25 VITALS — HR 85 | Temp 98.4°F | Resp 16

## 2020-01-25 DIAGNOSIS — J029 Acute pharyngitis, unspecified: Secondary | ICD-10-CM | POA: Diagnosis not present

## 2020-01-25 DIAGNOSIS — L089 Local infection of the skin and subcutaneous tissue, unspecified: Secondary | ICD-10-CM

## 2020-01-25 MED ORDER — DOXYCYCLINE HYCLATE 100 MG PO TABS: 100.0000 mg | ORAL_TABLET | Freq: Two times a day (BID) | ORAL | 0 refills | Status: DC

## 2020-01-25 NOTE — Progress Notes (Signed)
Patient ID: Nicholas Sharp, male    DOB: 10-04-1965, 54 y.o.   MRN: [id number]   Chief Complaint  Patient presents with  . Sore Throat    low grade fever since yesterday- also had staple go deep in tip of middle finger yesterday- UTD tetanus-TDAP 08/17/2017   Subjective:  CC: staple in right middle finger and sore throat  Presents today originally for a staple wound to the right middle finger, but also has a sore throat that has been present since yesterday.  He basically just feels lousy.  T-max is 99.1, he also is experiencing some fatigue.  Pertinent negatives include no chills no headache just some aching around his eyes no chest pain no shortness of breath no congestion no cough no ear pain no abdominal pain no nausea vomiting diarrhea.  He does report that the right middle finger was sore this morning and some pus was expressed from the puncture wound.  He has tried ibuprofen for the discomfort.  His last tetanus was on Aug 17, 2017 he is up-to-date.    Medical History Nicholas Sharp has a past medical history of Acute diverticulitis (01/25/2019), ADHD (attention deficit hyperactivity disorder), Alcohol consumption heavy, Back injury, DDD (degenerative disc disease), Diverticulitis, ED (erectile dysfunction), GERD (gastroesophageal reflux disease), Hyperlipidemia, and Insomnia.   Outpatient Encounter Medications as of 01/25/2020  Medication Sig  . Ascorbic Acid (VITAMIN C) 1000 MG tablet Take 1,000 mg by mouth daily. (Patient not taking: Reported on 09/22/2019)  . doxycycline (VIBRA-TABS) 100 MG tablet Take 1 tablet (100 mg total) by mouth 2 (two) times daily.  . Multiple Vitamin (MULTIVITAMIN WITH MINERALS) TABS tablet Take 1 tablet by mouth daily.  . [DISCONTINUED] ciprofloxacin (CIPRO) 500 MG tablet Take 1 tablet (500 mg total) by mouth 2 (two) times daily. (Patient not taking: Reported on 01/25/2020)  . [DISCONTINUED] doxycycline (VIBRA-TABS) 100 MG tablet Take 1 tablet (100 mg total) by  mouth 2 (two) times daily. (Patient not taking: Reported on 09/22/2019)  . [DISCONTINUED] metroNIDAZOLE (FLAGYL) 500 MG tablet Take 1 tablet (500 mg total) by mouth 2 (two) times daily. (Patient not taking: Reported on 01/25/2020)   No facility-administered encounter medications on file as of 01/25/2020.     Review of Systems  Constitutional: Negative for chills and fever.  HENT: Positive for sore throat. Negative for ear pain.   Eyes: Negative for pain.  Respiratory: Negative for cough and shortness of breath.   Cardiovascular: Negative for chest pain.  Gastrointestinal: Negative for abdominal pain, diarrhea, nausea and vomiting.  Skin: Negative.   Allergic/Immunologic: Negative.      Vitals Pulse 85   Temp 98.4 F (36.9 C)   Resp 16   SpO2 97%   Objective:   Physical Exam Vitals and nursing note reviewed.  Constitutional:      General: He is not in acute distress.    Appearance: He is well-developed. He is not ill-appearing or toxic-appearing.  HENT:     Right Ear: Tympanic membrane normal.     Left Ear: Tympanic membrane normal.     Mouth/Throat:     Mouth: Mucous membranes are moist.     Pharynx: Oropharynx is clear. Posterior oropharyngeal erythema present. No pharyngeal swelling or oropharyngeal exudate.  Cardiovascular:     Rate and Rhythm: Normal rate and regular rhythm.     Heart sounds: Normal heart sounds.  Pulmonary:     Effort: Pulmonary effort is normal.     Breath sounds: Normal breath sounds.  Musculoskeletal:     Right hand: Tenderness present. No bony tenderness.     Comments: Tiny puncture wound from staple on right tip of middle finger. Reports pus expressed from tip this morning. Tender to touch.  Skin:    General: Skin is warm and dry.  Neurological:     Mental Status: He is alert.      Assessment and Plan   1. Sore throat - Novel Coronavirus, NAA ([facility])  2. Local skin infection - doxycycline (VIBRA-TABS) 100 MG tablet; Take 1  tablet (100 mg total) by mouth 2 (two) times daily.  Dispense: 20 tablet; Refill: 0   Due to the report of a sore throat, and feeling lousy Covid test was done today.  His vital signs were good he is up-to-date on his tetanus vaccine.  Since he expressed pus from the puncture wound of the right middle finger, I will prescribe doxycycline for 10 days.  He reports that he is on fluconazole per Derm, this is compatible with doxycycline.  Instructions given on quarantine guidelines.  Agrees with plan of care discussed today. Understands warning signs to seek further care: Fever, shortness of breath, chest pain, worsening symptoms. Understands to follow-up if symptoms do not improve.  Will notify once Covid results are available.

## 2020-01-25 NOTE — Telephone Encounter (Signed)
Patient wanting to know if he is up date on his Tetanus shots. Please advise

## 2020-01-25 NOTE — Telephone Encounter (Signed)
Patient had Tdap 08/25/2017 UTD on tetanus

## 2020-01-26 LAB — SPECIMEN STATUS REPORT

## 2020-01-26 LAB — NOVEL CORONAVIRUS, NAA: SARS-CoV-2, NAA: NOT DETECTED

## 2020-01-26 LAB — SARS-COV-2, NAA 2 DAY TAT

## 2020-01-31 ENCOUNTER — Other Ambulatory Visit: Payer: Self-pay | Admitting: Nurse Practitioner

## 2020-02-07 ENCOUNTER — Encounter: Payer: Self-pay | Admitting: Family Medicine

## 2020-02-07 ENCOUNTER — Ambulatory Visit (INDEPENDENT_AMBULATORY_CARE_PROVIDER_SITE_OTHER): Payer: 59 | Admitting: Family Medicine

## 2020-02-07 ENCOUNTER — Other Ambulatory Visit: Payer: Self-pay

## 2020-02-07 VITALS — BP 128/94 | HR 100 | Temp 98.1°F | Wt 224.0 lb

## 2020-02-07 DIAGNOSIS — B029 Zoster without complications: Secondary | ICD-10-CM | POA: Diagnosis not present

## 2020-02-07 MED ORDER — VALACYCLOVIR HCL 1 G PO TABS: 1000.0000 mg | ORAL_TABLET | Freq: Three times a day (TID) | ORAL | 0 refills | Status: DC

## 2020-02-07 NOTE — Patient Instructions (Signed)
Shingles  Shingles is an infection. It gives you a painful skin rash and blisters that have fluid in them. Shingles is caused by the same germ (virus) that causes chickenpox. Shingles only happens in people who:  Have had chickenpox.  Have been given a shot of medicine (vaccine) to protect against chickenpox. Shingles is rare in this group. The first symptoms of shingles may be itching, tingling, or pain in an area on your skin. A rash will show on your skin a few days or weeks later. The rash is likely to be on one side of your body. The rash usually has a shape like a belt or a band. Over time, the rash turns into fluid-filled blisters. The blisters will break open, change into scabs, and dry up. Medicines may:  Help with pain and itching.  Help you get better sooner.  Help to prevent long-term problems. Follow these instructions at home: Medicines  Take over-the-counter and prescription medicines only as told by your doctor.  Put on an anti-itch cream or numbing cream where you have a rash, blisters, or scabs. Do this as told by your doctor. Helping with itching and discomfort   Put cold, wet cloths (cold compresses) on the area of the rash or blisters as told by your doctor.  Cool baths can help you feel better. Try adding baking soda or dry oatmeal to the water to lessen itching. Do not bathe in hot water. Blister and rash care  Keep your rash covered with a loose bandage (dressing).  Wear loose clothing that does not rub on your rash.  Keep your rash and blisters clean. To do this, wash the area with mild soap and cool water as told by your doctor.  Check your rash every day for signs of infection. Check for: ? More redness, swelling, or pain. ? Fluid or blood. ? Warmth. ? Pus or a bad smell.  Do not scratch your rash. Do not pick at your blisters. To help you to not scratch: ? Keep your fingernails clean and cut short. ? Wear gloves or mittens when you sleep, if  scratching is a problem. General instructions  Rest as told by your doctor.  Keep all follow-up visits as told by your doctor. This is important.  Wash your hands often with soap and water. If soap and water are not available, use hand sanitizer. Doing this lowers your chance of getting a skin infection caused by germs (bacteria).  Your infection can cause chickenpox in people who have never had chickenpox or never got a shot of chickenpox vaccine. If you have blisters that did not change into scabs yet, try not to touch other people or be around other people, especially: ? Babies. ? Pregnant women. ? Children who have areas of red, itchy, or rough skin (eczema). ? Very old people who have transplants. ? People who have a long-term (chronic) sickness, like cancer or AIDS. Contact a doctor if:  Your pain does not get better with medicine.  Your pain does not get better after the rash heals.  You have any signs of infection in the rash area. These signs include: ? More redness, swelling, or pain around the rash. ? Fluid or blood coming from the rash. ? The rash area feeling warm to the touch. ? Pus or a bad smell coming from the rash. Get help right away if:  The rash is on your face or nose.  You have pain in your face or pain by   your eye.  You lose feeling on one side of your face.  You have trouble seeing.  You have ear pain, or you have ringing in your ear.  You have a loss of taste.  Your condition gets worse. Summary  Shingles gives you a painful skin rash and blisters that have fluid in them.  Shingles is an infection. It is caused by the same germ (virus) that causes chickenpox.  Keep your rash covered with a loose bandage (dressing). Wear loose clothing that does not rub on your rash.  If you have blisters that did not change into scabs yet, try not to touch other people or be around people. This information is not intended to replace advice given to you by  your health care provider. Make sure you discuss any questions you have with your health care provider. Document Revised: 07/08/2018 Document Reviewed: 11/18/2016 Elsevier Patient Education  2020 Elsevier Inc.  

## 2020-02-07 NOTE — Progress Notes (Signed)
Patient ID: Nicholas Sharp, male    DOB: 03-12-1966, 54 y.o.   MRN: [id number]   Chief Complaint  Patient presents with  . Rash    Patient reports rash on abdomen since Sunday, not spreading but itchy. Patient has tried hydrocortisone and benadryl cream with no improvement. He is currently being treated by dermatology for another rash.   Subjective:  CC: rash on left side  Presents today with a complaint of a rash on the left side of his abdomen.  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  Symptoms started on Sunday.  He has had no fever, no chills, no chest pain no shortness of breath.  He was last seen on October 28 and was treated for a bacterial infection of his finger due to a staple injury, he completed his course of doxycycline.  He is on fluconazole per Dermatology.  He has not had the shingles vaccine.    Medical History Sevan has a past medical history of Acute diverticulitis (01/25/2019), ADHD (attention deficit hyperactivity disorder), Alcohol consumption heavy, Back injury, DDD (degenerative disc disease), Diverticulitis, ED (erectile dysfunction), GERD (gastroesophageal reflux disease), Hyperlipidemia, and Insomnia.   Outpatient Encounter Medications as of 02/07/2020  Medication Sig  . Ascorbic Acid (VITAMIN C) 1000 MG tablet Take 1,000 mg by mouth daily. (Patient not taking: Reported on 09/22/2019)  . Multiple Vitamin (MULTIVITAMIN WITH MINERALS) TABS tablet Take 1 tablet by mouth daily.  . valACYclovir (VALTREX) 1000 MG tablet Take 1 tablet (1,000 mg total) by mouth 3 (three) times daily.  . [DISCONTINUED] doxycycline (VIBRA-TABS) 100 MG tablet Take 1 tablet (100 mg total) by mouth 2 (two) times daily.  . [DISCONTINUED] fluconazole (DIFLUCAN) 150 MG tablet Take by mouth.   No facility-administered encounter medications on file as of 02/07/2020.     Review of Systems  Constitutional: Negative for chills and  fever.  Respiratory: Negative for shortness of breath.   Cardiovascular: Negative for chest pain.  Gastrointestinal: Negative for abdominal pain.  Skin: Positive for rash.       Left side of body.      Vitals BP (!) 128/94   Pulse 100   Temp 98.1 F (36.7 C)   Wt 224 lb (101.6 kg)   SpO2 96%   BMI 28.76 kg/m   Objective:   Physical Exam Vitals and nursing note reviewed.  Constitutional:      Appearance: Normal appearance.  Cardiovascular:     Rate and Rhythm: Regular rhythm. Tachycardia present.     Heart sounds: Normal heart sounds.  Pulmonary:     Effort: Pulmonary effort is normal.     Breath sounds: Normal breath sounds.  Skin:    General: Skin is warm and dry.     Findings: Erythema and rash present.     Comments: Erythematous raised papules in T8/T9 dermatome.  Neurological:     Mental Status: He is alert and oriented to person, place, and time.  Psychiatric:        Mood and Affect: Mood normal.        Behavior: Behavior normal.        Thought Content: Thought content normal.        Judgment: Judgment normal.      Assessment and Plan   1. Herpes zoster without complication - valACYclovir (VALTREX) 1000 MG tablet; Take 1 tablet (1,000 mg total) by mouth 3 (  three) times daily.  Dispense: 21 tablet; Refill: 0   He will go to pharmacy immediately and start his valacyclovir.  Interactions checked between Diflucan and valacyclovir no interactions found.  He will continue to use his Diflucan per dermatology for a different purpose.  For the discomfort, recommended lidocaine OTC patch.  Agrees with plan of care discussed today. Understands warning signs to seek further care: Strict instructions given that if this rash appears anywhere on the face/close to the eyes, this could be an emergent situation in he could lose vision in his eyes. Understands to follow-up in 1 week, sooner if no improvement is seen.  He questions about when he can take the shingles  vaccine, spoke with Dr. Lilyan Punt, he recommends no sooner than June 2022, as there is no concrete recommendation in the literature.   Dorena Bodo, FNP-C

## 2020-02-09 ENCOUNTER — Other Ambulatory Visit: Payer: Self-pay | Admitting: Family Medicine

## 2020-02-09 ENCOUNTER — Telehealth: Payer: Self-pay

## 2020-02-09 DIAGNOSIS — B029 Zoster without complications: Secondary | ICD-10-CM

## 2020-02-09 MED ORDER — PREDNISONE 20 MG PO TABS: ORAL_TABLET | ORAL | 0 refills | Status: DC

## 2020-02-09 NOTE — Telephone Encounter (Signed)
Pt called back to office. Pt states [facility] on [address] Did not have Prednisone. Resent Prednisone to [facility] while patient was on phone.

## 2020-02-09 NOTE — Telephone Encounter (Signed)
Shingles is on abdomen and has not spread but has not got any better either and it itches and is really painful and heard prednisone could help- still taking the valtrex

## 2020-02-09 NOTE — Addendum Note (Signed)
Addended by: Marlowe Shores on: 02/09/2020 02:08 PM   Modules accepted: Orders

## 2020-02-09 NOTE — Telephone Encounter (Signed)
Prednisone taper sent ot [facility] in [place]. Thanks, Clydie Braun

## 2020-02-09 NOTE — Telephone Encounter (Signed)
Patient notified and requested medication transferred to [facility]. Prescription transferred as requested.

## 2020-02-09 NOTE — Telephone Encounter (Signed)
Patient states he has shingles and is in a lot of pain and wondering if he can get a prescription for prednisone or something else for pain. [facility]

## 2020-02-14 ENCOUNTER — Ambulatory Visit: Payer: 59 | Admitting: Family Medicine

## 2020-02-14 ENCOUNTER — Other Ambulatory Visit: Payer: Self-pay

## 2020-02-14 ENCOUNTER — Encounter: Payer: Self-pay | Admitting: Family Medicine

## 2020-02-14 VITALS — BP 126/86 | HR 102 | Temp 97.8°F

## 2020-02-14 DIAGNOSIS — B029 Zoster without complications: Secondary | ICD-10-CM | POA: Diagnosis not present

## 2020-02-14 DIAGNOSIS — F411 Generalized anxiety disorder: Secondary | ICD-10-CM

## 2020-02-14 MED ORDER — ALPRAZOLAM 0.25 MG PO TABS: 0.2500 mg | ORAL_TABLET | Freq: Two times a day (BID) | ORAL | 0 refills | Status: DC | PRN

## 2020-02-14 NOTE — Progress Notes (Signed)
Patient ID: Nicholas Sharp, male    DOB: 04-06-1965, 54 y.o.   MRN: [id number]   Chief Complaint  Patient presents with  . Follow-up    Patient following up on a rash on his abdomen since being on prednisone. Patient believes rash has improved but has not cleared up.    Subjective:  CC: follow-up  Presents today for follow-up from November 10 shingles diagnosis.  Has been on Valtrex and prednisone.  Feels better.  Reports there is still a little itching.  No other symptoms, no fever no chills no chest pain no shortness of breath.  Wants to discuss "anxiety ".  Has occasional periods where he awakens" feels like something is wrong ".  Dr. Lubertha South has giving him Xanax in the past, which he reports that he uses 3-4 times a year.    Medical History Jonn has a past medical history of Acute diverticulitis (01/25/2019), ADHD (attention deficit hyperactivity disorder), Alcohol consumption heavy, Back injury, DDD (degenerative disc disease), Diverticulitis, ED (erectile dysfunction), GERD (gastroesophageal reflux disease), Hyperlipidemia, and Insomnia.   Outpatient Encounter Medications as of 02/14/2020  Medication Sig  . ALPRAZolam (XANAX) 0.25 MG tablet Take 1 tablet (0.25 mg total) by mouth 2 (two) times daily as needed for anxiety.  . Ascorbic Acid (VITAMIN C) 1000 MG tablet Take 1,000 mg by mouth daily. (Patient not taking: Reported on 09/22/2019)  . Multiple Vitamin (MULTIVITAMIN WITH MINERALS) TABS tablet Take 1 tablet by mouth daily.  . predniSONE (DELTASONE) 20 MG tablet Take 3 tablets by mouth for three days, then 2 tablets by mouth for 3 days, then one tablet by mouth for 3 days.  . valACYclovir (VALTREX) 1000 MG tablet Take 1 tablet (1,000 mg total) by mouth 3 (three) times daily.   No facility-administered encounter medications on file as of 02/14/2020.     Review of Systems  Constitutional: Negative for chills and fever.  Respiratory: Negative for shortness of breath.     Cardiovascular: Negative for chest pain.  Skin: Positive for rash.  Psychiatric/Behavioral: The patient is nervous/anxious.      Vitals BP 126/86   Pulse (!) 102   Temp 97.8 F (36.6 C)   SpO2 96%   Objective:   Physical Exam Vitals and nursing note reviewed.  Constitutional:      General: He is not in acute distress.    Appearance: Normal appearance. He is not ill-appearing.  Cardiovascular:     Rate and Rhythm: Normal rate and regular rhythm.     Heart sounds: Normal heart sounds.  Pulmonary:     Effort: Pulmonary effort is normal.     Breath sounds: Normal breath sounds.  Skin:    General: Skin is warm and dry.     Findings: Rash present.     Comments: Left side shingles rash much improved. Still pruritic. Taking prednisone and Valtrex.   Neurological:     General: No focal deficit present.     Mental Status: He is alert and oriented to person, place, and time.  Psychiatric:        Mood and Affect: Mood normal.        Behavior: Behavior normal.        Thought Content: Thought content normal.        Judgment: Judgment normal.     Comments: Reports occasional times when he wakes up with "feeling like something is wrong" and pending doom. Unable to shake the feeling.  GAD 7: 7  Assessment and Plan   1. Generalized anxiety disorder - ALPRAZolam (XANAX) 0.25 MG tablet; Take 1 tablet (0.25 mg total) by mouth 2 (two) times daily as needed for anxiety.  Dispense: 20 tablet; Refill: 0  2. Herpes zoster without complication   Herpes zoster rash much improved.  We will continue taking prednisone and Valtrex until completion.  Generalized Anxiety:   Occasionally wakes up with a feeling of "something is wrong ""pending doom"  is difficult to shake this feeling, has to get up to go outside, and cannot go back to sleep.  Request Xanax.  Xanax sent #20 no refills.  Reports that this only happens 3-4 times per year, still has pills from the last prescription, has expired,  if he needs to take it, and he gets drug tested at work, he could get fired with an expired prescription. GAD 7: 7 today.   Agrees with plan of care discussed today. Understands warning signs to seek further care: Chest pain, shortness of breath, any significant change in health status. Understands to follow-up if symptoms worsen, do not improve.  Dorena Bodo, FNP-C

## 2020-05-27 ENCOUNTER — Telehealth: Payer: Self-pay | Admitting: Gastroenterology

## 2020-05-27 NOTE — Telephone Encounter (Signed)
Patient called states he having a lot of discomfort and abdominal pain seeking advise.

## 2020-05-27 NOTE — Telephone Encounter (Signed)
Pt states he has been having abd pain for about a week. States it is not in a particular spot but moves around. He has a hx of diverticulitis. Pt scheduled to see Hyacinth Meeker PA tomorrow at [facility]. Pt aware of appt.

## 2020-05-28 ENCOUNTER — Ambulatory Visit: Payer: 59 | Admitting: Physician Assistant

## 2020-05-28 ENCOUNTER — Encounter: Payer: Self-pay | Admitting: Physician Assistant

## 2020-05-28 VITALS — BP 152/78 | HR 78 | Ht 74.0 in | Wt 230.0 lb

## 2020-05-28 DIAGNOSIS — K5900 Constipation, unspecified: Secondary | ICD-10-CM

## 2020-05-28 DIAGNOSIS — R1032 Left lower quadrant pain: Secondary | ICD-10-CM

## 2020-05-28 MED ORDER — METRONIDAZOLE 500 MG PO TABS: 500.0000 mg | ORAL_TABLET | Freq: Three times a day (TID) | ORAL | 0 refills | Status: DC

## 2020-05-28 MED ORDER — CIPROFLOXACIN HCL 500 MG PO TABS: 500.0000 mg | ORAL_TABLET | Freq: Two times a day (BID) | ORAL | 0 refills | Status: AC

## 2020-05-28 NOTE — Patient Instructions (Signed)
If you are age 54 or older, your body mass index should be between 23-30. Your Body mass index is 29.53 kg/m. If this is out of the aforementioned range listed, please consider follow up with your Primary Care Provider.  If you are age 16 or younger, your body mass index should be between 19-25. Your Body mass index is 29.53 kg/m. If this is out of the aformentioned range listed, please consider follow up with your Primary Care Provider.   We have sent the following medications to your pharmacy for you to pick up at your convenience: Cipro 500 mg and Flagyl 500 mg.   Please call if you are not feeling better  Thank you for choosing me and [facility].  Hyacinth Meeker, PA-C

## 2020-05-28 NOTE — Progress Notes (Signed)
____________________________________________________________  Attending physician addendum:  Thank you for sending this case to me. I have reviewed the entire note and agree with the plan.  I recall seeing him before.  I think empiric therapy for diverticulitis is entirely reasonable given his previous presentations and current symptoms. He also seems to have episodic constipation that is likely contributing to symptoms.  Amada Jupiter, MD  ____________________________________________________________

## 2020-05-28 NOTE — Progress Notes (Signed)
Chief Complaint: Abdominal pain  HPI:    Nicholas Sharp is a 55 year old Caucasian male, known to Dr. Myrtie Neither, with a past medical history as listed below including degenerative disc disease, GERD and alcohol consumption, who was referred to me by Annalee Genta, DO for a complaint of abdominal pain.      11/05/2016 colonoscopy with diverticulosis in the left colon otherwise normal.  Repeat recommended 10 years.    04/13/2019 patient seen in clinic for diverticulitis at that time as noted he had been admitted to [facility] in late October with severe diverticulitis, CT scan showed microperforation.  He was treated conservatively and improved.  He then contacted [facility] in late November with slowly worsening left lower quadrant pain was prescribed Cipro and Flagyl and seen in the clinic 03/10/2019.  Had a repeat CT scan which showed constipation but no active diverticulitis.  At time of that office visit he was improved.    09/29/2019 CT abdomen pelvis with contrast showed prominent stool throughout the colon, sigmoid colon diverticulosis without active diverticulitis.  That time is recommended he start Benefiber to avoid constipation.    Today, the patient tells me that about a week ago he started "not feeling great", he had some severe back pain for which he was putting on Voltaren and feels like he became constipated.  Along with this describes a generalized abdominal pain at first which has now settled itself in his left lower quadrant.  Pain is a constant 4/10.  Also describes an increase in "sounds in my stomach", as well as some heartburn which is abnormal for him.  Tells me he restarted his fiber supplements taking Metamucil twice daily and now his bowel movements are normal.  Occasionally uses MiraLAX.    Works as a Company secretary.    Denies fever, chills, blood in his stool or symptoms that awaken him from sleep.     Past Medical History:  Diagnosis Date  . Acute diverticulitis  01/25/2019  . ADHD (attention deficit hyperactivity disorder)   . Alcohol consumption heavy   . Back injury   . DDD (degenerative disc disease)   . Diverticulitis   . ED (erectile dysfunction)   . GERD (gastroesophageal reflux disease)   . Hyperlipidemia    Diet controlled  . Insomnia    Previously    Past Surgical History:  Procedure Laterality Date  . COLONOSCOPY  10/2016   left sided diverticulosis  . ESOPHAGOGASTRODUODENOSCOPY     Dr Magod-hiatal hernia, normal esophagus & esophageal bx, gastritis (no bx), normal D1/D2  . ESOPHAGOGASTRODUODENOSCOPY  02/17/2012   Mild reflux esophagitis, small hh  . KNEE SURGERY     x 2   right  . SHOULDER SURGERY     x 2  left    Current Outpatient Medications  Medication Sig Dispense Refill  . diclofenac Sodium (VOLTAREN) 1 % GEL Apply topically as needed.    . Multiple Vitamin (MULTIVITAMIN) tablet Take 1 tablet by mouth daily.    . vitamin C (ASCORBIC ACID) 500 MG tablet Take 500 mg by mouth daily.     No current facility-administered medications for this visit.    Allergies as of 05/28/2020 - Review Complete 05/28/2020  Allergen Reaction Noted  . Hydrocodone  01/10/2019    Family History  Problem Relation Age of Onset  . Diabetes Mother   . Dementia Mother   . Congestive Heart Failure Mother   . Irritable bowel syndrome Mother   .  Diverticulosis Mother   . Hypertension Father   . Depression Sister   . Diverticulitis Sister   . Colon cancer Neg Hx   . Esophageal cancer Neg Hx   . Rectal cancer Neg Hx   . Stomach cancer Neg Hx   . Pancreatic cancer Neg Hx   . Prostate cancer Neg Hx     Social History   Socioeconomic History  . Marital status: Single    Spouse name: Not on file  . Number of children: 1  . Years of education: Not on file  . Highest education level: Not on file  Occupational History  . Occupation: Scientist, physiological: [employer]  Tobacco Use  . Smoking status: Never  Smoker  . Smokeless tobacco: Never Used  Vaping Use  . Vaping Use: Never used  Substance and Sexual Activity  . Alcohol use: Yes    Alcohol/week: 15.0 standard drinks    Types: 15 Cans of beer per week    Comment: several beers/wk  . Drug use: No  . Sexual activity: Not on file  Other Topics Concern  . Not on file  Social History Narrative   1 son-24 healthy   Lives alone   Social Determinants of Health   Financial Resource Strain: Not on file  Food Insecurity: Not on file  Transportation Needs: Not on file  Physical Activity: Not on file  Stress: Not on file  Social Connections: Not on file  Intimate Partner Violence: Not on file    Review of Systems:    Constitutional: No weight loss, fever or chills Cardiovascular: No chest pain  Respiratory: No SOB  Gastrointestinal: See HPI and otherwise negative   Physical Exam:  Vital signs: BP (!) 152/78   Pulse 78   Ht 6\' 2"  (1.88 m)   Wt 230 lb (104.3 kg)   BMI 29.53 kg/m   Constitutional:   Pleasant Caucasian male appears to be in NAD, Well developed, Well nourished, alert and cooperative Respiratory: Respirations even and unlabored. Lungs clear to auscultation bilaterally.   No wheezes, crackles, or rhonchi.  Cardiovascular: Normal S1, S2. No MRG. Regular rate and rhythm. No peripheral edema, cyanosis or pallor.  Gastrointestinal:  Soft, nondistended, moderate left lower quadrant TTP with some involuntary guarding. Normal bowel sounds. No appreciable masses or hepatomegaly. Rectal:  Not performed.  Psychiatric: Demonstrates good judgement and reason without abnormal affect or behaviors.  No recent labs or imaging.  Assessment: 1.  Left lower quadrant pain: Reminiscent of diverticulitis, preceded by constipation 2.  Constipation: Chronic for the patient, typically helped by fiber supplements  Plan: 1.  Started the patient empirically on Ciprofloxacin 500 mg twice daily x7 days and Flagyl 500 mg 3 times daily x7  days.  Did discuss that he cannot drink while using these medications. 2.  Would recommend the patient maintain regular bowel movements, this is through the use of fiber and MiraLAX as needed in the future.  We discussed this in detail. 3.  Patient will call if his symptoms are not any better after antibiotics as above.  At that time would need to repeat a CT of the abdomen and pelvis for further eval.  Discussed that we are trying to avoid this today due to all of his recent imaging. 4.  Patient to follow in clinic as needed.  , PA-C [facility] 05/28/2020, 9:16 AM  Cc: 07/28/2020, DO

## 2020-06-06 ENCOUNTER — Ambulatory Visit: Payer: 59 | Admitting: Gastroenterology

## 2020-06-07 ENCOUNTER — Telehealth: Payer: Self-pay | Admitting: Physician Assistant

## 2020-06-07 DIAGNOSIS — R1032 Left lower quadrant pain: Secondary | ICD-10-CM

## 2020-06-07 NOTE — Telephone Encounter (Signed)
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

## 2020-06-07 NOTE — Telephone Encounter (Signed)
Spoke with patient, he reports pain around genital area, he states that it is not as bad, he describes as a dull pain that comes and goes, no heavy lifting or anything recently - pain has been going on for about 2 weeks. Patient states that he has completed the course of antibiotics, he also takes Metamucil during the day and Miralax at night which have helped him keep regular bowel movements. Please advise, thank you.

## 2020-06-10 ENCOUNTER — Other Ambulatory Visit (INDEPENDENT_AMBULATORY_CARE_PROVIDER_SITE_OTHER): Payer: 59

## 2020-06-10 DIAGNOSIS — R1032 Left lower quadrant pain: Secondary | ICD-10-CM | POA: Diagnosis not present

## 2020-06-10 LAB — CREATININE, SERUM: Creatinine, Ser: 0.65 mg/dL (ref 0.40–1.50)

## 2020-06-10 LAB — BUN: BUN: 16 mg/dL (ref 6–23)

## 2020-06-10 NOTE — Telephone Encounter (Signed)
Please order Ct abdomen pelvis with contrast if not feeling better.  Thanks-JLL

## 2020-06-10 NOTE — Telephone Encounter (Signed)
Wants to be seen today if possible. Called Friday and never heard back.

## 2020-06-10 NOTE — Telephone Encounter (Signed)
Patient came in for lab work and to pick up instructions and prep. Patient was trying to see f he could get a sooner appointment, I called [facility] and was told that they did not have any more appointments for today. Advised patient that if we changed the location we would have to put in a new order, get that authorized by his insurance and still await lab results. Also advised that there is no guarantee that they would have any availability today either. I provided patient with [facility] number for him to be added to a cancellation list if he could. Answered all of patient's questions, he verbalized understanding and had no concerns at the end of the call.

## 2020-06-10 NOTE — Telephone Encounter (Addendum)
Patient has been scheduled for a CT scan at [facility] on Tuesday, 06/11/20 at 1:30 PM. Patient to arrive at 1:15 PM. NPO 4 hours prior. Drink 1st bottle of contrast at 11:30 PM, 2nd bottle at 12:30 PM. Patient will need lab work today.   Spoke with patient he is aware of appointment information, he will come to our lab this afternoon to have labs drawn and will pick up instructions and contrast at that time as well. Patient verbalized understanding of all information and had no concerns at the end of the call.

## 2020-06-11 ENCOUNTER — Telehealth: Payer: Self-pay | Admitting: Gastroenterology

## 2020-06-11 ENCOUNTER — Ambulatory Visit (INDEPENDENT_AMBULATORY_CARE_PROVIDER_SITE_OTHER)
Admission: RE | Admit: 2020-06-11 | Discharge: 2020-06-11 | Disposition: A | Discharge status: Home / Self Care | Payer: 59 | Source: Ambulatory Visit | Attending: Physician Assistant | Admitting: Physician Assistant

## 2020-06-11 ENCOUNTER — Other Ambulatory Visit: Payer: Self-pay

## 2020-06-11 DIAGNOSIS — R1032 Left lower quadrant pain: Secondary | ICD-10-CM | POA: Diagnosis not present

## 2020-06-11 MED ORDER — IOHEXOL 300 MG/ML  SOLN
100.0000 mL | Freq: Once | INTRAMUSCULAR | Status: AC | PRN
  Administered 2020-06-11: 100 mL via INTRAVENOUS

## 2020-06-11 MED ORDER — AMOXICILLIN-POT CLAVULANATE 875-125 MG PO TABS: 1.0000 | ORAL_TABLET | Freq: Two times a day (BID) | ORAL | 0 refills | Status: AC

## 2020-06-12 NOTE — Telephone Encounter (Signed)
Spoke with patient in regards to his CT scan results and Jennifer's recommendations. See 06/11/20 CT result note for more information.

## 2020-06-21 ENCOUNTER — Telehealth: Payer: Self-pay | Admitting: Physician Assistant

## 2020-06-21 NOTE — Telephone Encounter (Signed)
Brooklyn is [employer] nurse please send to her thank you

## 2020-06-21 NOTE — Telephone Encounter (Signed)
Dr. Christella Hartigan as DOD this afternoon (06/21/20);   Danis patient recently seen by Victorino Dike for Diverticulitis, at 05/28/20 office visit patient was prescribed Cipro and Flagyl for 7 days. Once the CT report came back it still showed diverticulitis so Augmentin 875-125 was prescribed for 10 days.  Patient reports that he completed his antibiotics yesterday, he states that he is feeling better but yesterday he began having the same LLQ pain, reports an "upset stomach" yesterday, just one episode of loose stools. Today has been better but he ate a salad with leafy greens, he states that he has been trying to avoid any heavy foods at this time. He is wanting to know if he needs another round of antibiotics. Please advise, thanks.

## 2020-06-21 NOTE — Telephone Encounter (Signed)
He should monitor his symptoms for the next 2 to 3 days.  If the pain continues to feel better then great.  If the pains worsen then he should call back and at that point I think we would need to consider repeat CT scan before a third course of oral antibiotics.

## 2020-06-21 NOTE — Telephone Encounter (Signed)
Patient called stated he finished his round of antibiotics and is still feeling abdominal pain is wondering if Hyacinth Meeker PA-C would want to start him on another round of antibiotics to knock it out. Please advise.

## 2020-06-21 NOTE — Telephone Encounter (Signed)
Patient has been notified of recommendations, patient states that he will give [place] a call Monday either way with an update. He verbalized understanding and had no concerns at the end of the call.

## 2020-07-03 ENCOUNTER — Telehealth: Payer: Self-pay | Admitting: Gastroenterology

## 2020-07-03 MED ORDER — DICYCLOMINE HCL 10 MG PO CAPS
10.0000 mg | ORAL_CAPSULE | Freq: Four times a day (QID) | ORAL | 0 refills | Status: DC | PRN
Start: 2020-07-03 — End: 2020-07-09

## 2020-07-03 NOTE — Telephone Encounter (Signed)
Spoke with patient in regards to recommendations. Patient states that he is not "bent over in pain or having enough pain to take pain medications." Advised that this is not a narcotic or a scheduled medication and that he can take it as needed if he is having pain/spasms. Patient states that he wants answers and wants a long term solution. Advised patient that he can further discuss with Dr. Myrtie Neither at his upcoming appt. Patient verbalized understanding and had no concerns at the end of the call.   Prescription sent to pharmacy on file - pt confirmed

## 2020-07-03 NOTE — Telephone Encounter (Signed)
Spoke with patient, he states that he finished his second round of antibiotics around 06/20/20, LLQ abdominal pain is better than before but still bothersome. He states that he feels lethargic, no fever. Reports that he is having pretty regular bowel movements but the pain is what is bothering him. He states that he eats pretty clean and takes his fiber. He is becoming frustrated by the recurrent pain. Please advise, thank you

## 2020-07-03 NOTE — Telephone Encounter (Signed)
He has follow-up coming up with Dr. Myrtie Neither within the next week.  For now let us give him some Dicyclomine 10 mg to be taken every 6 hours as needed for this pain to see if that helps a little bit, hopefully it is just a little bit of residual spasm and not continued diverticulitis.  I will let him talk this through with Dr. Myrtie Neither.  Thanks, J LL

## 2020-07-08 ENCOUNTER — Telehealth: Payer: Self-pay | Admitting: Family Medicine

## 2020-07-08 NOTE — Telephone Encounter (Signed)
I don't prescribe the 1mg  dose of xanax.  If pt needing this needing appt to discuss anxiety.   Thx.   Dr. 

## 2020-07-08 NOTE — Telephone Encounter (Signed)
Left message to return call 

## 2020-07-08 NOTE — Telephone Encounter (Signed)
Patient is requesting refill on xanax 1 mg this was lasted filled 07/18/13. He states only uses as needed. [facility]

## 2020-07-09 ENCOUNTER — Other Ambulatory Visit: Payer: Self-pay

## 2020-07-09 ENCOUNTER — Telehealth: Payer: Self-pay

## 2020-07-09 ENCOUNTER — Ambulatory Visit: Payer: 59 | Admitting: Gastroenterology

## 2020-07-09 ENCOUNTER — Encounter: Payer: Self-pay | Admitting: Gastroenterology

## 2020-07-09 VITALS — BP 120/82 | HR 82 | Ht 74.0 in | Wt 234.0 lb

## 2020-07-09 DIAGNOSIS — K5732 Diverticulitis of large intestine without perforation or abscess without bleeding: Secondary | ICD-10-CM

## 2020-07-09 DIAGNOSIS — R140 Abdominal distension (gaseous): Secondary | ICD-10-CM

## 2020-07-09 DIAGNOSIS — R1032 Left lower quadrant pain: Secondary | ICD-10-CM

## 2020-07-09 NOTE — Progress Notes (Signed)
[facility] Progress Note  Chief Complaint: Diverticulitis and left lower quadrant abdominal pain  Subjective  History: I saw Nicholas Sharp once in January 2021 after a prolonged episode of complicated diverticulitis.  He was also having intermittent left lower quadrant pain after that recovery that seemed likely related to episodic constipation. He contacted our office about 6 weeks ago with recurrence of left lower quadrant pain after developing some left lower quadrant abdominal pain occurring after constipation during an episode of back pain.  He was seen by our PA who gave empiric therapy for possible diverticulitis with 7 days of ciprofloxacin and metronidazole. He called back a little over a week later not feeling well again, and a CT abdomen and pelvis was ordered as noted below.  It appeared to show mild persistent short segment diverticulitis, for which she was treated with 10 days of Augmentin.  He contacted our office on April 6 saying that the pain had returned after completing the antibiotic course.  He was offered some dicyclomine but did not feel that was necessary.  Clinic visit was made today to discuss ongoing treatment.  Nicholas Sharp reports feeling well at this point.  He has some intermittent dull or sharp pain that could be left lower or right lower quadrant.  He tends toward bloating and thinks it may be due to some foods.  He describes a high-fiber diet that also includes a lot of cabbage, broccoli and asparagus.  He does not feel excessively gassy, he denies rectal bleeding. He recently started a probiotic and thinks it is helping make his bowels more regular. He also takes a "4 in 1" fiber supplement most days. ROS: Cardiovascular:  no chest pain Respiratory: no dyspnea Remainder of systems negative except as above The patient's Past Medical, Family and Social History were reviewed and are on file in the EMR.  Objective:  Med list reviewed  Current Outpatient  Medications:  .  diclofenac Sodium (VOLTAREN) 1 % GEL, Apply topically as needed., Disp: , Rfl:  .  Multiple Vitamin (MULTIVITAMIN) tablet, Take 1 tablet by mouth daily., Disp: , Rfl:  .  psyllium (METAMUCIL) 58.6 % powder, Take 1 packet by mouth 3 (three) times daily., Disp: , Rfl:  .  Saccharomyces boulardii (PROBIOTIC) 250 MG CAPS, Take 1 capsule by mouth daily., Disp: , Rfl:  .  vitamin C (ASCORBIC ACID) 500 MG tablet, Take 500 mg by mouth daily., Disp: , Rfl:    Vital signs in last 24 hrs: Vitals:   07/09/20 1313  BP: 120/82  Pulse: 82   Wt Readings from Last 3 Encounters:  07/09/20 234 lb (106.1 kg)  05/28/20 230 lb (104.3 kg)  02/07/20 224 lb (101.6 kg)    Physical Exam  Well-appearing  HEENT: sclera anicteric, oral mucosa moist without lesions  Neck: supple, no thyromegaly, JVD or lymphadenopathy  Cardiac: RRR without murmurs, S1S2 heard, no peripheral edema  Pulm: clear to auscultation bilaterally, normal RR and effort noted  Abdomen: soft, no tenderness, with active bowel sounds. No guarding or palpable hepatosplenomegaly.  Skin; warm and dry, no jaundice or rash  Labs:   ___________________________________________ Radiologic studies:  CLINICAL DATA:  Left lower quadrant pain   EXAM: CT ABDOMEN AND PELVIS WITH CONTRAST   TECHNIQUE: Multidetector CT imaging of the abdomen and pelvis was performed using the standard protocol following bolus administration of intravenous contrast.   CONTRAST:  OMNIPAQUE IOHEXOL 300 MG/ML  SOLN   COMPARISON:  None.   FINDINGS:  Lower chest: No acute abnormality.  Normal size heart.   Hepatobiliary: Hypodense lesions in the left lobe of the liver measuring up to 9 mm on image 19/2 which are technically too small accurately characterize but favored represent benign etiology such as cysts. No suspicious solid lesion. Gallbladder is unremarkable. Biliary ductal dilation.   Pancreas: Unremarkable   Spleen:  Unremarkable   Adrenals/Urinary Tract: Adrenal glands are unremarkable.   There is symmetric enhancement and excretion of contrast in the kidneys. No suspicious filling defect visualized within the opacified portions the collecting system and ureter on delayed imaging. No suspicious renal lesions   Urinary bladder is grossly unremarkable for degree of distension.   Stomach/Bowel: Enteric contrast visualized to the level of the descending colon. Stomach is grossly unremarkable. No suspicious small bowel wall thickening or dilation. Normal appendix. Sigmoid colonic diverticulosis with mild short segment sigmoid wall thickening and adjacent inflammatory stranding.   Vascular/Lymphatic: Aortic atherosclerosis. No enlarged abdominal or pelvic lymph nodes.   Reproductive: Prostate is unremarkable.   Other: No drainable fluid collections.  No pneumoperitoneum.   Musculoskeletal: No acute osseous abnormality.   IMPRESSION: 1. Sigmoid colonic diverticulosis with short segment mild wall thickening and adjacent stranding, which may represent acute uncomplicated sigmoid diverticulitis. No drainable fluid collections or pneumoperitoneum. 2. Aortic atherosclerosis.   Aortic Atherosclerosis (ICD10-I70.0).     Electronically Signed   By: Maudry Mayhew MD   On: 06/11/2020 14:06  CT images personally reviewed ____________________________________________ Other:   _____________________________________________ Assessment & Plan  Assessment: Encounter Diagnoses  Name Primary?  . Diverticulitis of colon Yes  . LLQ pain   . Abdominal bloating    He has had a second documented episode of diverticulitis, and this one took a second round of antibiotics but was certainly a less complicated and protracted course than his first episode in late 2020.  The most recent episode was subtle on CT scan, but had also been partially treated by that point.  Radiographic appearance also does not  necessarily correlate well to the degree of symptoms.  He also has some bloating that seems likely related to dietary triggers.  I gave him some advice on that and recommended he take some Beano supplements when consuming certain foods.  It does not seem that he is doing something or failing to do something to precipitate the diverticulitis episodes.  He is relatively young and will probably have further episodes, so I recommended he consider consultation with a colorectal surgeon to discuss elective sigmoid resection.  They might feel it is too soon, but it would still be helpful to have them part of his team in case this problem escalates.  Plan: Some dietary advice as noted above. I think he can stop the fiber supplement because he already gets a high-fiber diet. No particular recommendation on the probiotics as it is not studied in this situation.  I will see him back as needed.  25 minutes were spent on this encounter (including chart review, history/exam, counseling/coordination of care, and documentation) > 50% of that time was spent on counseling and coordination of care.  Topics discussed included: See above.  Nicholas Sharp

## 2020-07-09 NOTE — Telephone Encounter (Signed)
Patient notified must have visit and schedule appointment on 4/28

## 2020-07-09 NOTE — Patient Instructions (Addendum)
If you are age 54 or older, your body mass index should be between 23-30. Your Body mass index is 30.04 kg/m. If this is out of the aforementioned range listed, please consider follow up with your Primary Care Provider.  If you are age 53 or younger, your body mass index should be between 19-25. Your Body mass index is 30.04 kg/m. If this is out of the aformentioned range listed, please consider follow up with your Primary Care Provider.   We have sent your records to [facility]. They will reach out to you to schedule.   It was a pleasure to see you today!  Dr. Myrtie Neither

## 2020-07-10 NOTE — Telephone Encounter (Signed)
Void  

## 2020-07-25 ENCOUNTER — Other Ambulatory Visit: Payer: Self-pay

## 2020-07-25 ENCOUNTER — Ambulatory Visit: Payer: 59 | Admitting: Family Medicine

## 2020-07-25 VITALS — BP 122/90 | HR 75 | Temp 97.3°F | Ht 74.0 in | Wt 230.0 lb

## 2020-07-25 DIAGNOSIS — F410 Panic disorder [episodic paroxysmal anxiety] without agoraphobia: Secondary | ICD-10-CM

## 2020-07-25 DIAGNOSIS — F411 Generalized anxiety disorder: Secondary | ICD-10-CM | POA: Diagnosis not present

## 2020-07-25 MED ORDER — BUSPIRONE HCL 10 MG PO TABS: 10.0000 mg | ORAL_TABLET | Freq: Two times a day (BID) | ORAL | 0 refills | Status: DC | PRN

## 2020-07-25 NOTE — Progress Notes (Signed)
Patient ID: Nicholas Sharp, male    DOB: 1965/08/25, 55 y.o.   MRN: [id number]   Chief Complaint  Patient presents with  . Anxiety   Subjective:    HPI F/u on anxiety and discuss shingrix.  Last year had shingles.  Wanting to get the shingrix.  Wondering if needing to wait to get vaccine.  Anxiety- not having issue day to day.  Waking up at night with panic attack.  Pt stating taking 1-2 hrs to get over it. Not able to sleep.   Pt was on xanax in past.  At times feeling needing to go to ER bc can't get calmed down. Pt was on the xanax 1mg  dose, then went to 0.5mg  dose. We weaned him off it to 0.25mg . Happening 1x every 2 months.  Medical History Nicholas Sharp has a past medical history of Acute diverticulitis (01/25/2019), ADHD (attention deficit hyperactivity disorder), Alcohol consumption heavy, Back injury, DDD (degenerative disc disease), Diverticulitis, ED (erectile dysfunction), GERD (gastroesophageal reflux disease), Hyperlipidemia, and Insomnia.   Outpatient Encounter Medications as of 07/25/2020  Medication Sig  . busPIRone (BUSPAR) 10 MG tablet Take 1 tablet (10 mg total) by mouth 2 (two) times daily as needed.  . diclofenac Sodium (VOLTAREN) 1 % GEL Apply topically as needed.  . Multiple Vitamin (MULTIVITAMIN) tablet Take 1 tablet by mouth daily.  . psyllium (METAMUCIL) 58.6 % powder Take 1 packet by mouth 3 (three) times daily.  . Saccharomyces boulardii (PROBIOTIC) 250 MG CAPS Take 1 capsule by mouth daily.  . vitamin C (ASCORBIC ACID) 500 MG tablet Take 500 mg by mouth daily.   No facility-administered encounter medications on file as of 07/25/2020.     Review of Systems  Constitutional: Negative for chills and fever.  HENT: Negative for congestion, rhinorrhea and sore throat.   Respiratory: Negative for cough, shortness of breath and wheezing.   Cardiovascular: Negative for chest pain and leg swelling.  Gastrointestinal: Negative for abdominal pain, diarrhea,  nausea and vomiting.  Genitourinary: Negative for dysuria and frequency.  Skin: Negative for rash.  Neurological: Negative for dizziness, weakness and headaches.  Psychiatric/Behavioral: Positive for sleep disturbance. Negative for dysphoric mood, self-injury and suicidal ideas. The patient is nervous/anxious.      Vitals BP 122/90   Pulse 75   Temp (!) 97.3 F (36.3 C)   Ht 6\' 2"  (1.88 m)   Wt 230 lb (104.3 kg)   SpO2 96%   BMI 29.53 kg/m   Objective:   Physical Exam Vitals and nursing note reviewed.  Constitutional:      General: He is not in acute distress.    Appearance: Normal appearance.  Pulmonary:     Effort: Pulmonary effort is normal. No respiratory distress.  Musculoskeletal:        General: Normal range of motion.  Skin:    Findings: No rash.  Neurological:     General: No focal deficit present.     Mental Status: He is alert and oriented to person, place, and time.  Psychiatric:        Behavior: Behavior normal.        Thought Content: Thought content normal.        Judgment: Judgment normal.     Comments: +anxious mood       Assessment and Plan   1. Generalized anxiety disorder  2. Panic attacks   H/o GAD and panic attacks- Long discussion about taking benzodiazepines long term for anxiety and panic attacks, and discussed  the need to treat the underlying disorder with an SSRI.  And that I would be willing to give buspar or hydroxyzine for anxiety, but not wanting to restart the xanax.  Offered pt psychiatry referral.  Pt declining seeing psychiatry for anxiety or xanax. Pt willing to try buspar.   Pt able to get shingrix vaccine from pharmacy.  Return if symptoms worsen or fail to improve.

## 2020-08-30 NOTE — Telephone Encounter (Signed)
Referral has been sent to [facility] 07-09-2020. Pt was seen on 08-20-2020 by A. Maisie Fus, MD

## 2020-09-18 IMAGING — CT CT ABD-PELV W/ CM
2 of 5 series · 15 of 46 positions shown, 17 images · IV contrast (Omnipaque or Isovue)
Comparison: 01/26/2018 CT abdomen/pelvis.

CLINICAL DATA: Severe abdominal pain.

EXAM:
CT ABDOMEN AND PELVIS WITH CONTRAST
TECHNIQUE: Multidetector CT imaging of the abdomen and pelvis was performed
using the standard protocol following bolus administration of
intravenous contrast.
CONTRAST:  100mL OMNIPAQUE IOHEXOL 300 MG/ML  SOLN

[Series 2: axial st · axial · 0.73mm/px · z∈[+887,+1357]mm · 12 of 108 slices shown, 14 images]
[im 7/108  soft-tissue]
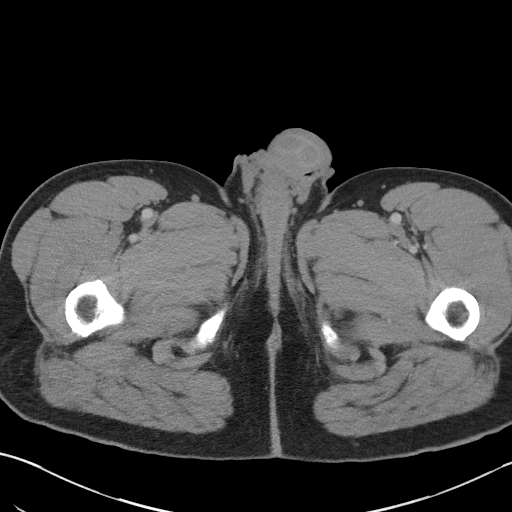
[im 7/108  bone]
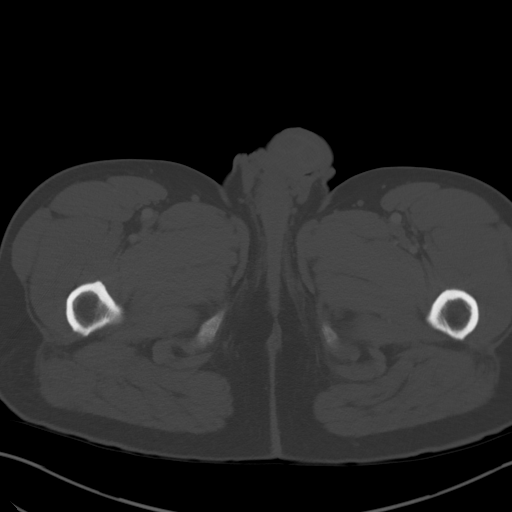
[im 14/108  soft-tissue]
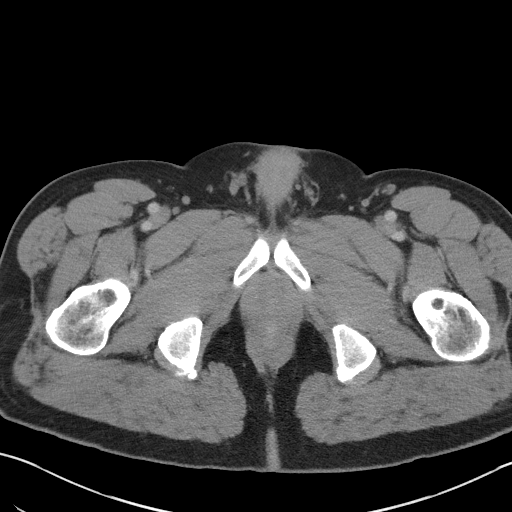
[im 27/108  soft-tissue]
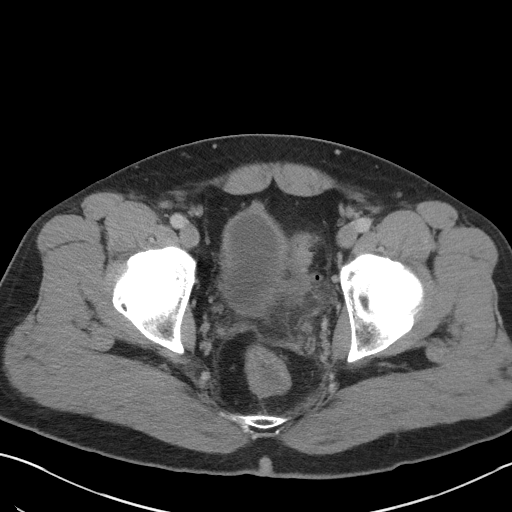
[im 34/108  soft-tissue]
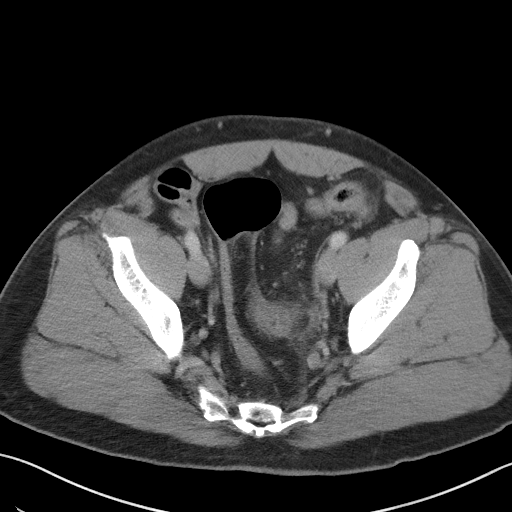
[im 41/108  soft-tissue]
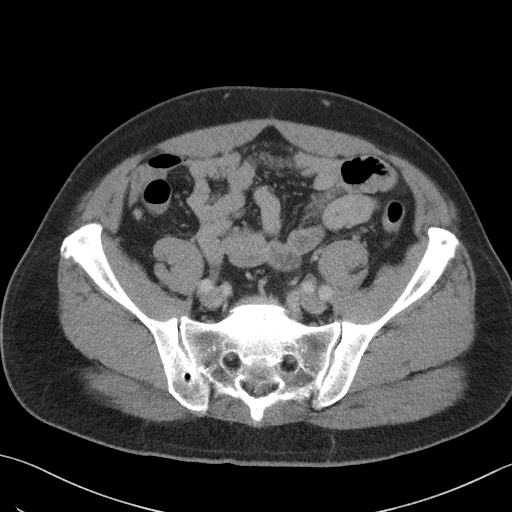
[im 47/108  soft-tissue]
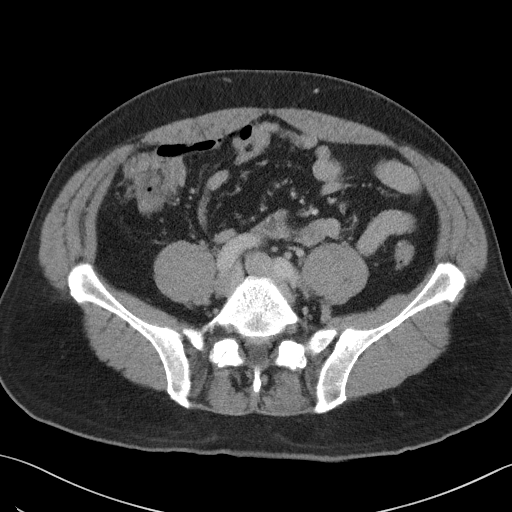
[im 61/108  soft-tissue]
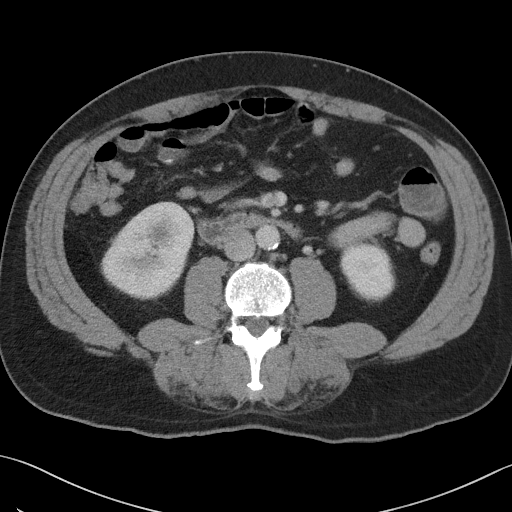
[im 67/108  soft-tissue]
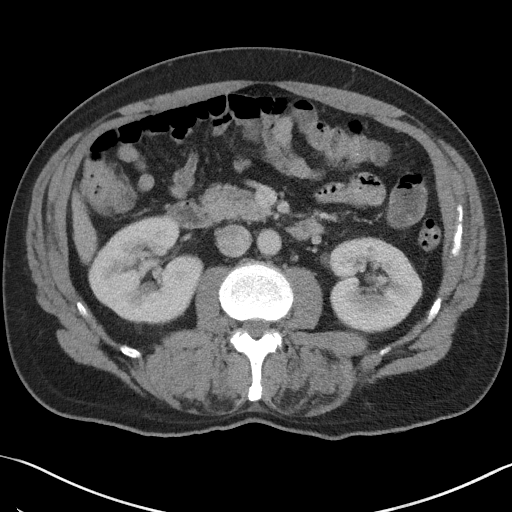
[im 74/108  soft-tissue]
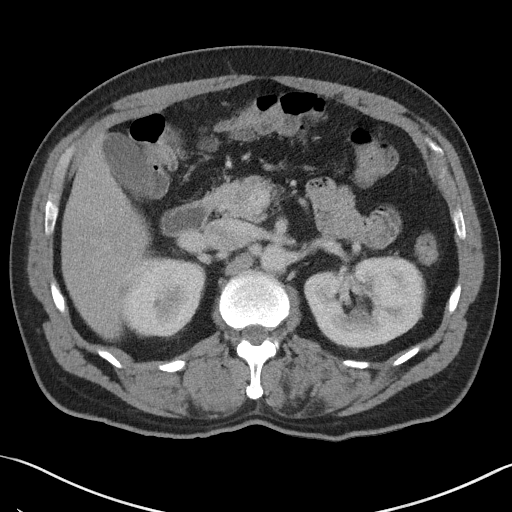
[im 74/108  bone]
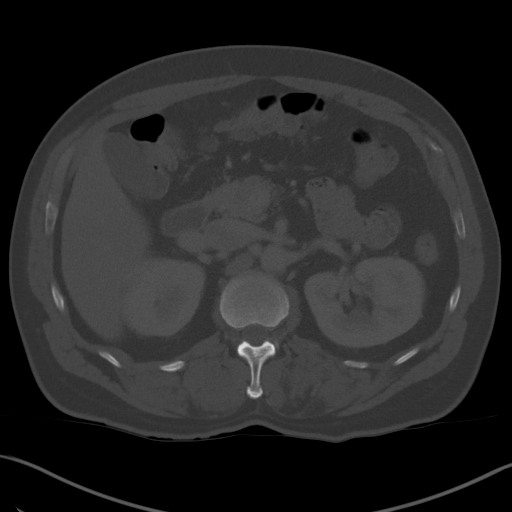
[im 81/108  soft-tissue]
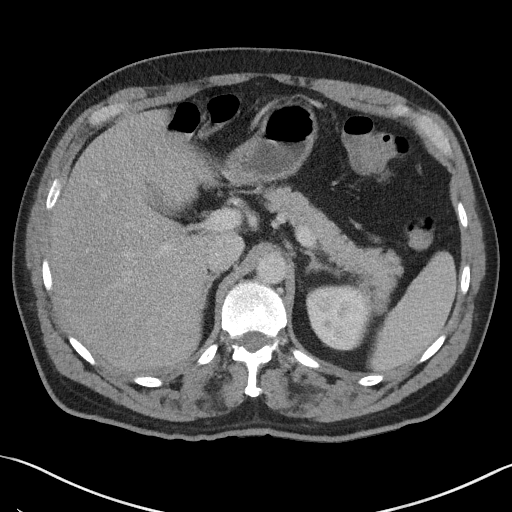
[im 94/108  soft-tissue]
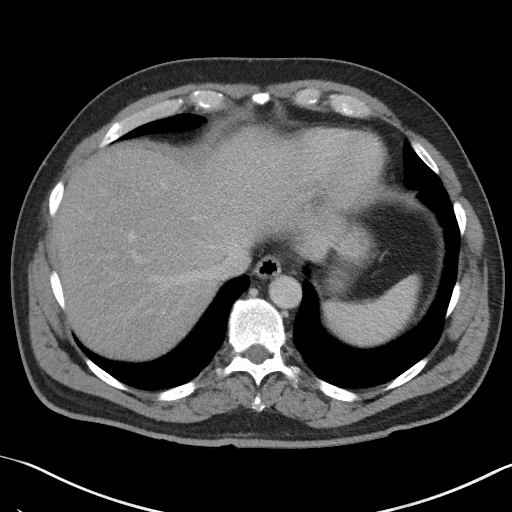
[im 101/108  soft-tissue]
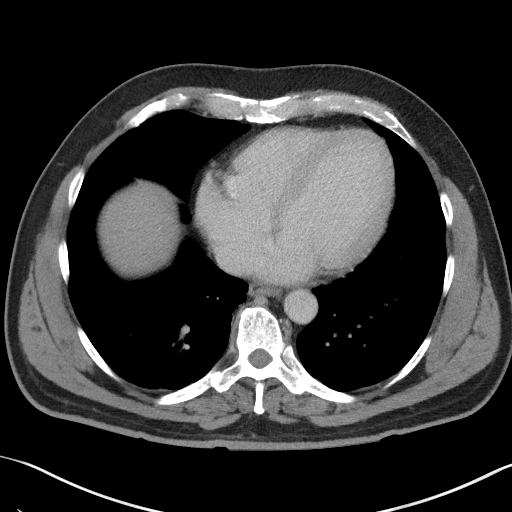

[Series 5: coronal st · coronal · 0.68mm/px · 3 of 101 slices shown]
[im 34/101  soft-tissue]
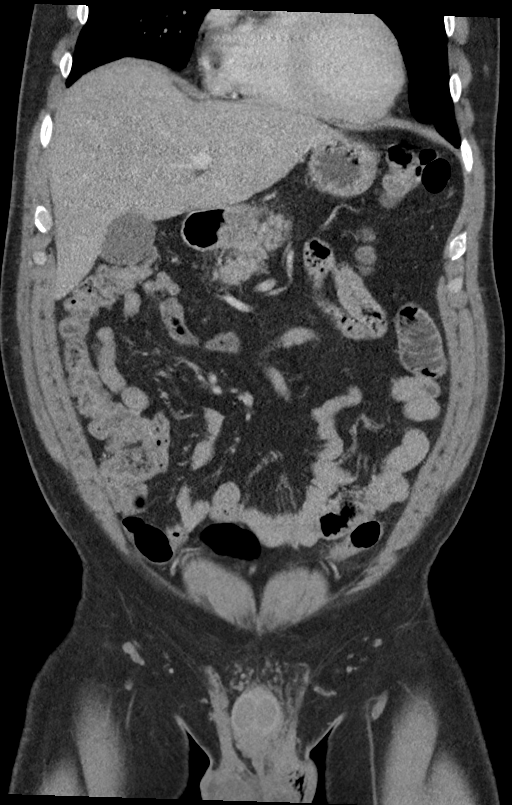
[im 45/101  soft-tissue]
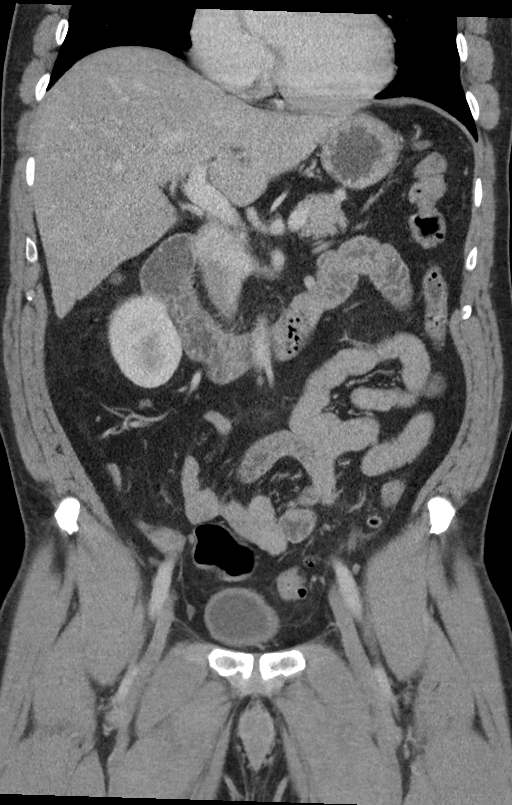
[im 56/101  soft-tissue]
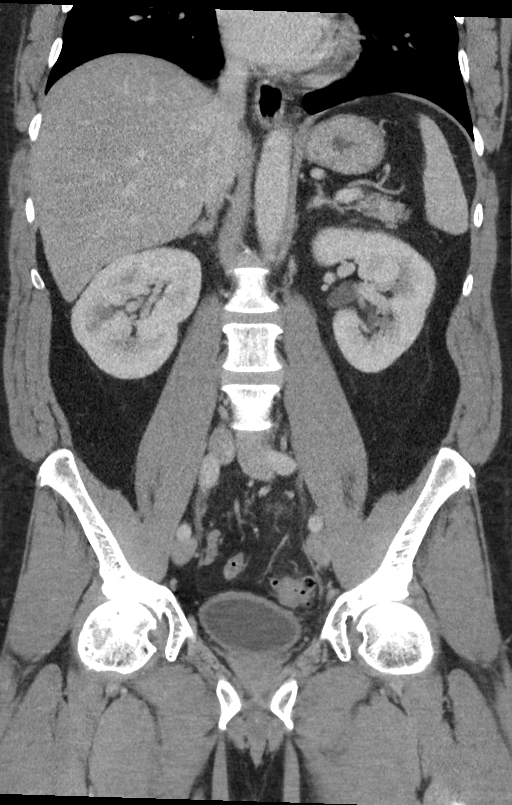

[15 of 46 positions shown; findings below may reference images not displayed]

FINDINGS: Lower chest: No significant pulmonary nodules or acute consolidative
airspace disease.

Hepatobiliary: Normal liver size. Subcentimeter hypodense segment 2
left liver lobe lesion, too small to characterize, unchanged,
considered benign. No new liver lesions. Normal gallbladder with no
radiopaque cholelithiasis. No biliary ductal dilatation.

Pancreas: Normal, with no mass or duct dilation.

Spleen: Normal size. No mass.

Adrenals/Urinary Tract: Normal adrenals. No hydronephrosis.
Subcentimeter hypodense renal cortical lesion in interpolar right
kidney is too small to characterize and requires no follow-up. No
additional renal lesions. Normal bladder.

Stomach/Bowel: Normal non-distended stomach. Normal caliber small
bowel with no small bowel wall thickening. Normal appendix. Moderate
sigmoid diverticulosis. There is segmental moderate wall thickening
with prominent surrounding fat stranding in mid sigmoid colon,
compatible with acute sigmoid diverticulitis. A few tiny foci of
free air indicate localized perforation. No pericolonic abscess.

Vascular/Lymphatic: Atherosclerotic nonaneurysmal abdominal aorta.
Patent portal, splenic, hepatic and renal veins. No pathologically
enlarged lymph nodes in the abdomen or pelvis.

Reproductive: Top-normal size prostate. Nonspecific tiny internal
prostatic calcifications.

Other: No ascites.

Musculoskeletal: No aggressive appearing focal osseous lesions. Mild
thoracolumbar spondylosis.
IMPRESSION: 1. Acute sigmoid diverticulitis with tiny foci of pericolonic free
air indicating perforation. No discrete abscess.
2.  Aortic Atherosclerosis ([id number]).

## 2021-09-15 ENCOUNTER — Other Ambulatory Visit (HOSPITAL_COMMUNITY): Payer: Self-pay | Admitting: Internal Medicine

## 2021-09-15 DIAGNOSIS — R059 Cough, unspecified: Secondary | ICD-10-CM

## 2021-09-16 ENCOUNTER — Ambulatory Visit (HOSPITAL_COMMUNITY)
Admission: RE | Admit: 2021-09-16 | Discharge: 2021-09-16 | Disposition: A | Discharge status: Home / Self Care | Payer: 59 | Source: Ambulatory Visit | Attending: Internal Medicine | Admitting: Internal Medicine

## 2021-09-16 DIAGNOSIS — R059 Cough, unspecified: Secondary | ICD-10-CM | POA: Diagnosis not present

## 2022-02-09 ENCOUNTER — Telehealth: Payer: Self-pay | Admitting: Gastroenterology

## 2022-02-09 MED ORDER — CIPROFLOXACIN HCL 500 MG PO TABS: 500.0000 mg | ORAL_TABLET | Freq: Two times a day (BID) | ORAL | 0 refills | Status: AC

## 2022-02-09 MED ORDER — METRONIDAZOLE 500 MG PO TABS: 500.0000 mg | ORAL_TABLET | Freq: Three times a day (TID) | ORAL | 0 refills | Status: AC

## 2022-02-09 NOTE — Telephone Encounter (Signed)
Patient called stating his Diverticulitis is flaring up and he is having lower left quadrant pain, wondering if there's any way Dr Myrtie Neither can send him some antibiotics to the [facility] on [address] in [place]. Please advise

## 2022-02-09 NOTE — Telephone Encounter (Signed)
I am familiar with him, and I am also willing to treat him with a 7-day course of ciprofloxacin and metronidazole as empiric therapy for possible diverticulitis.  Prescriptions were sent to his pharmacy.  (No alcohol while taking metronidazole)  If he is not improved after this treatment, let us know and we may need to do a CT scan.  Also, please get me the office consult report from Dr. Maisie Fus at [facility].  I believe she saw him in May 2022, but I cannot find a note in the media section of the chart.  HD

## 2022-02-09 NOTE — Telephone Encounter (Signed)
Called [facility] and spoke with Bonita Quin in medical records. She stated that she did not see any records in their new system, Bonita Quin will check to see if they have records in the old system and will fax them if she is able to find them. I asked that [facility] fax records to my attention at [phone]. Bonita Quin stated that if I did not receive the fax to call her back.  Called and spoke with patient. I let pt know that prescriptions have been sent to his requested pharmacy. He knows to contact us if his symptoms do not improve after treatment b/c we may need to order a CT scan. Pt has been advised to completely avoid alcohol during treatment. Pt verbalized understanding and had no concerns at the end of the call

## 2022-02-09 NOTE — Telephone Encounter (Signed)
Returned call to patient. He states that he has been having LLQ pain for the past 4-5 days. Pt states that the pain is not that prevalent but he feels like it is diverticulitis. He denies any fever or chills. He did have diarrhea today only. He states that he did see the surgeon and they told him that his flare ups have been related to his bowels not moving. Pt states that he was having trouble having a bowel movement before this started. He was on pain medications for his back injury.  I told pt that I would send a message but he may also need an office visit since he has not been seen in a while. Pt states that he would like to take it one step at time and would like antibiotics prescribed to his pharmacy on file. Pt states that he would not be able to come in until December. Pt states that he may not be able to answer the phone, but I can leave a detailed vm with recommendations. Please advise, thanks.

## 2022-02-09 NOTE — Telephone Encounter (Signed)
Records have been received and placed in your office IN box for review.

## 2022-02-10 NOTE — Telephone Encounter (Signed)
Surgical office consult note by Dr. Romie Levee dated 08/20/2020 received, reviewed and will be sent for scanned to chart.  She felt that surgery was not indicated at that time, but that he would be a good candidate if his diverticular disease continued to cause significant problems.  - Nicholas Sharp

## 2022-05-29 ENCOUNTER — Other Ambulatory Visit (HOSPITAL_COMMUNITY): Payer: Self-pay | Admitting: Internal Medicine

## 2022-05-29 ENCOUNTER — Ambulatory Visit (HOSPITAL_COMMUNITY)
Admission: RE | Admit: 2022-05-29 | Discharge: 2022-05-29 | Disposition: A | Discharge status: Home / Self Care | Payer: 59 | Source: Ambulatory Visit | Attending: Internal Medicine | Admitting: Internal Medicine

## 2022-05-29 DIAGNOSIS — R1030 Lower abdominal pain, unspecified: Secondary | ICD-10-CM | POA: Diagnosis present

## 2022-05-29 MED ORDER — IOHEXOL 300 MG/ML  SOLN
100.0000 mL | Freq: Once | INTRAMUSCULAR | Status: AC | PRN
Start: 2022-05-29 — End: 2022-05-29
  Administered 2022-05-29: 100 mL via INTRAVENOUS

## 2022-06-02 ENCOUNTER — Telehealth: Payer: Self-pay | Admitting: Gastroenterology

## 2022-06-02 NOTE — Telephone Encounter (Signed)
PT is calling to advise [place] that his PCP will be faxing over results of CT scan.

## 2022-06-02 NOTE — Telephone Encounter (Signed)
Presumable he is having recurrent abdominal pain.  CT scan does not report acute diverticulitis (though no oral contrast given)  He has an appointment with APP on April 1st.  I have a 3:40 appointment tomorrow if he would like to see me.  - HD

## 2022-06-02 NOTE — Telephone Encounter (Signed)
Pt returned call. We reviewed results. Pt would like to be seen by Dr. Loletha Carrow tomorrow at 3:40 pm. Pt is aware that his appt has been moved. Pt verbalized understanding and had no concerns at the end of the call.

## 2022-06-02 NOTE — Telephone Encounter (Signed)
Lm on vm for patient to return call 

## 2022-06-03 ENCOUNTER — Ambulatory Visit: Payer: 59 | Admitting: Gastroenterology

## 2022-06-03 ENCOUNTER — Encounter: Payer: Self-pay | Admitting: Gastroenterology

## 2022-06-03 VITALS — BP 134/78 | HR 93 | Ht 74.0 in | Wt 234.0 lb

## 2022-06-03 DIAGNOSIS — K7689 Other specified diseases of liver: Secondary | ICD-10-CM

## 2022-06-03 DIAGNOSIS — K5730 Diverticulosis of large intestine without perforation or abscess without bleeding: Secondary | ICD-10-CM | POA: Diagnosis not present

## 2022-06-03 DIAGNOSIS — K760 Fatty (change of) liver, not elsewhere classified: Secondary | ICD-10-CM

## 2022-06-03 NOTE — Patient Instructions (Addendum)
  Please see me as needed. _______________________________________________________  If your blood pressure at your visit was 140/90 or greater, please contact your primary care physician to follow up on this.  _______________________________________________________  If you are age 56 or older, your body mass index should be between 23-30. Your Body mass index is 30.04 kg/m. If this is out of the aforementioned range listed, please consider follow up with your Primary Care Provider.  If you are age 74 or younger, your body mass index should be between 19-25. Your Body mass index is 30.04 kg/m. If this is out of the aformentioned range listed, please consider follow up with your Primary Care Provider.   ________________________________________________________  The  GI providers would like to encourage you to use [facility] to communicate with providers for non-urgent requests or questions.  Due to long hold times on the telephone, sending your provider a message by [facility] may be a faster and more efficient way to get a response.  Please allow 48 business hours for a response.  Please remember that this is for non-urgent requests.  _______________________________________________________ It was a pleasure to see you today!  Thank you for trusting me with your gastrointestinal care!

## 2022-06-03 NOTE — Progress Notes (Signed)
GI Progress Note  Chief Complaint: History of diverticulitis, lower abdominal/groin pain  Subjective  History: From my April 2022 office visit, which is the last time I saw Onesimus: "I saw Carden once in January 2021 after a prolonged episode of complicated diverticulitis.  He was also having intermittent left lower quadrant pain after that recovery that seemed likely related to episodic constipation. He contacted our office about 6 weeks ago with recurrence of left lower quadrant pain after developing some left lower quadrant abdominal pain occurring after constipation during an episode of back pain.  He was seen by our PA who gave empiric therapy for possible diverticulitis with 7 days of ciprofloxacin and metronidazole. He called back a little over a week later not feeling well again, and a CT abdomen and pelvis was ordered as noted below.  It appeared to show mild persistent short segment diverticulitis, for which she was treated with 10 days of Augmentin.   He contacted our office on April 6 saying that the pain had returned after completing the antibiotic course.  He was offered some dicyclomine but did not feel that was necessary.  Clinic visit was made today to discuss ongoing treatment.   Lakeith reports feeling well at this point.  He has some intermittent dull or sharp pain that could be left lower or right lower quadrant.  He tends toward bloating and thinks it may be due to some foods.  He describes a high-fiber diet that also includes a lot of cabbage, broccoli and asparagus.  He does not feel excessively gassy, he denies rectal bleeding. He recently started a probiotic and thinks it is helping make his bowels more regular. He also takes a "4 in 1" fiber supplement most days"  At that point, he had had 2 documented episodes of diverticulitis with the most recent 1 requiring a second round of antibiotics spite being less radiographically complicated than the first  1.  With that in his relatively young age, I recommended he colorectal surgery evaluation for consideration of elective sigmoid resection.  He saw Dr. Marcello Moores May 2022.  She felt that surgery was not necessary at that time but could be considered if this patient continued to have problems with diverticulitis. __________________________  He contacted [place] and November 2023 with several days of left lower quadrant pain and alteration of bowel habits, having recently been on some pain medicine after back injury.  He thought his symptoms may be reminiscent of diverticulitis, so I elected to treat him empirically with 7 days of ciprofloxacin and metronidazole. He contacted [place] yesterday to tell us he had had a CT abdomen and pelvis ordered by primary care.  Presumably he was having ongoing symptoms of some kind, so was brought for today's office visit.  Reif had his first screening colonoscopy with me in August 2018.  It was a complete exam with good preparation, left-sided diverticulosis and otherwise normal no polyps. ___________________   Tommie Raymond recently saw his PCP because he was having some bandlike pain across both sides of the groin.  They examined him and could not determine the cause and therefore scheduled the CT scan. He feels his bowel habits been more regular taking fiber supplement and a probiotic with prebiotics.  He did feel better after the antibiotics are prescribed in November, but he is not sure perhaps the problem was already getting better on its own.  Denies rectal bleeding.  He is trying to generally be healthier and cut down  his beer intake, and plans to make further efforts at that and carbohydrate reduction after seeing the CT scan revealing fatty liver.  ROS: Cardiovascular:  no chest pain Respiratory: no dyspnea  The patient's Past Medical, Family and Social History were reviewed and are on file in the EMR. Past Medical History:  Diagnosis Date   Acute diverticulitis  01/25/2019   ADHD (attention deficit hyperactivity disorder)    Alcohol consumption heavy    Back injury    DDD (degenerative disc disease)    Diverticulitis    ED (erectile dysfunction)    GERD (gastroesophageal reflux disease)    Hyperlipidemia    Diet controlled   Insomnia    Previously    Objective:  Med list reviewed  Current Outpatient Medications:    diclofenac Sodium (VOLTAREN) 1 % GEL, Apply topically as needed., Disp: , Rfl:    Multiple Vitamin (MULTIVITAMIN) tablet, Take 1 tablet by mouth daily., Disp: , Rfl:    psyllium (METAMUCIL) 58.6 % powder, Take 1 packet by mouth 3 (three) times daily., Disp: , Rfl:    Saccharomyces boulardii (PROBIOTIC) 250 MG CAPS, Take 1 capsule by mouth daily., Disp: , Rfl:    vitamin C (ASCORBIC ACID) 500 MG tablet, Take 500 mg by mouth daily., Disp: , Rfl:    busPIRone (BUSPAR) 10 MG tablet, Take 1 tablet (10 mg total) by mouth 2 (two) times daily as needed. (Patient not taking: Reported on 06/03/2022), Disp: 30 tablet, Rfl: 0   Vital signs in last 24 hrs: Vitals:   06/03/22 1541  BP: 134/78  Pulse: 93   Wt Readings from Last 3 Encounters:  06/03/22 234 lb (106.1 kg)  07/25/20 230 lb (104.3 kg)  07/09/20 234 lb (106.1 kg)    Physical Exam  Well-appearing  Cardiac: Regular without appreciable murmur,  no peripheral edema Pulm: clear to auscultation bilaterally, normal RR and effort noted Abdomen: soft, no tenderness, with active bowel sounds. No guarding or palpable hepatosplenomegaly. The location where he was having pain is indicated as over both inguinal regions.  No palpable fullness mass or tenderness, no appreciable hernia.  Labs:   ___________________________________________ Radiologic studies: CLINICAL DATA:  Pain right lower quadrant of abdomen   EXAM: CT ABDOMEN AND PELVIS WITH CONTRAST   TECHNIQUE: Multidetector CT imaging of the abdomen and pelvis was performed using the standard protocol following bolus  administration of intravenous contrast.   RADIATION DOSE REDUCTION: This exam was performed according to the departmental dose-optimization program which includes automated exposure control, adjustment of the mA and/or kV according to patient size and/or use of iterative reconstruction technique.   CONTRAST:  170m OMNIPAQUE IOHEXOL 300 MG/ML  SOLN   COMPARISON:  06/11/2020   FINDINGS: Lower chest: No new focal infiltrates are seen in the lower lung fields.   Hepatobiliary: There is fatty infiltration in liver. There is 12 mm cystic structure in the left lobe. There is no dilation of bile ducts. Gallbladder is not distended.   Pancreas: No focal abnormalities are seen.   Spleen: Unremarkable.   Adrenals/Urinary Tract: Adrenals are unremarkable. There is no hydronephrosis. There are few left renal stones each measuring less than 4 mm in size. There is no perinephric fluid collection. Urinary bladder is unremarkable.   Stomach/Bowel: Stomach is unremarkable. Small bowel loops are not dilated. Appendix is not dilated. Scattered diverticula are seen in colon without signs of focal acute diverticulitis.   Vascular/Lymphatic: Scattered arterial calcifications are seen.   Reproductive: There are coarse calcifications  in prostate.   Other: There is no ascites or pneumoperitoneum.   Musculoskeletal: Degenerative changes are noted in lumbar spine, more so at L5-S1 level.   IMPRESSION: There is no evidence of intestinal obstruction or pneumoperitoneum. Appendix is not dilated. There is no hydronephrosis.   Diverticulosis of colon without signs of focal diverticulitis. Few small nonobstructing left renal stones. Fatty liver. Possible cyst in the left lobe of liver.     Electronically Signed   By: Elmer Picker M.D.   On: 05/29/2022 16:42   (I personally reviewed the CT images.  No oral contrast.  Reportedly with IV contrast, though there does not appear to be aortic  and other arterial enhancement on this study).  ____________________________________________ Other:   _____________________________________________ Assessment & Plan  Assessment: Encounter Diagnoses  Name Primary?   Diverticulosis of colon Yes   Fatty liver    Liver cyst    He was having some pain recently that was thought perhaps to be digestive, but I do not think that it was based on his location.  Benign exam today.  CT scan with incidental findings of liver cyst and fatty liver.  We discussed the fatty liver and some diet and lifestyle changes as noted above.  Ziyan also thought that perhaps he had a polyp on the last colonoscopy but I reassured him there were none.  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  I am not aware of any current guidelines recommending more frequent colorectal cancer screening in that population, so I advised him to keep the 10-year recall.  He was reassured and will contact me as needed.   Nelida Meuse III

## 2022-06-29 ENCOUNTER — Ambulatory Visit: Payer: 59 | Admitting: Physician Assistant

## 2023-02-08 ENCOUNTER — Telehealth: Payer: Self-pay | Admitting: Gastroenterology

## 2023-02-08 MED ORDER — METRONIDAZOLE 500 MG PO TABS: 500.0000 mg | ORAL_TABLET | Freq: Two times a day (BID) | ORAL | 0 refills | Status: AC

## 2023-02-08 MED ORDER — CIPROFLOXACIN HCL 500 MG PO TABS: 500.0000 mg | ORAL_TABLET | Freq: Two times a day (BID) | ORAL | 0 refills | Status: AC

## 2023-02-08 NOTE — Telephone Encounter (Signed)
It is difficult for me to say since, having been doing endoscopic procedures all day, I have not been able to examine him like the physician at the urgent care.  If they felt this was not likely to be diverticulitis, then another option is for him to take 1 packet dose of MiraLAX today and another tomorrow to relieve any constipation that could be present.  If he does not feel better with that, then take the antibiotics.    H Danis

## 2023-02-08 NOTE — Telephone Encounter (Signed)
Called pt back and let him know the recommendations per Dr. Myrtie Neither. Pt states since he could not get an appt with [place] today he went to urgent care. Reports the doc at urgent care told him he did not think he had diverticulitis he thought he was constipated. I asked pt when his last BM was and he reports this am but he still has some LLQ discomfort. Urgent care doc gave him azithromycin for an URI pt states he had.   Pt wants to know if Dr. Myrtie Neither thinks he should take the antibiotics from urgent care or the ones prescribed by Dr. Myrtie Neither. Please advise.

## 2023-02-08 NOTE — Telephone Encounter (Signed)
    I am familiar with him, and I am willing to treat him with a 7-day course of antibiotics (ciprofloxacin and metronidazole.)  Prescriptions were sent to his pharmacy.  (No alcohol while taking metronidazole)  If he is not improved after this treatment, let us know and we may need to do a CT abdomen and pelvis with a possible repeat evaluation by colorectal surgery.  H Danis

## 2023-02-08 NOTE — Telephone Encounter (Signed)
Inbound call from patient,  States he had a low grade fever yesterday and and aches and pains. States he is having lower left Q pain, constipated for two days and believes he is having a diverticulitis flare and is unsure if he needs to be prescribed an antibiotic. Patient states this is an urgent matter.

## 2023-02-08 NOTE — Telephone Encounter (Signed)
Spoke with pt and he is aware of recommendations per Dr. Myrtie Neither. He verbalized understanding.

## 2023-02-08 NOTE — Telephone Encounter (Signed)
Pt calling thinks he is having a diverticulitis flare. Reports he is having LLQ abd pain along with being constipated for the past 2 days. States he had a low grade fever yesterday and and aches and pains. He is requesting antibiotics be called in for him. Please advise.

## 2023-06-04 ENCOUNTER — Other Ambulatory Visit: Payer: Self-pay

## 2023-06-04 ENCOUNTER — Telehealth: Payer: Self-pay | Admitting: Gastroenterology

## 2023-06-04 ENCOUNTER — Encounter (HOSPITAL_COMMUNITY): Payer: Self-pay | Admitting: Emergency Medicine

## 2023-06-04 ENCOUNTER — Emergency Department (HOSPITAL_COMMUNITY)

## 2023-06-04 ENCOUNTER — Emergency Department (HOSPITAL_COMMUNITY)
Admission: EM | Admit: 2023-06-04 | Discharge: 2023-06-04 | Disposition: A | Discharge status: Home / Self Care | Attending: Emergency Medicine | Admitting: Emergency Medicine

## 2023-06-04 DIAGNOSIS — Z79899 Other long term (current) drug therapy: Secondary | ICD-10-CM | POA: Insufficient documentation

## 2023-06-04 DIAGNOSIS — K5792 Diverticulitis of intestine, part unspecified, without perforation or abscess without bleeding: Secondary | ICD-10-CM

## 2023-06-04 DIAGNOSIS — K5732 Diverticulitis of large intestine without perforation or abscess without bleeding: Secondary | ICD-10-CM | POA: Insufficient documentation

## 2023-06-04 DIAGNOSIS — R109 Unspecified abdominal pain: Secondary | ICD-10-CM | POA: Diagnosis present

## 2023-06-04 LAB — COMPREHENSIVE METABOLIC PANEL
ALT: 45 U/L — ABNORMAL HIGH (ref 0–44)
AST: 30 U/L (ref 15–41)
Albumin: 4.3 g/dL (ref 3.5–5.0)
Alkaline Phosphatase: 90 U/L (ref 38–126)
Anion gap: 9 (ref 5–15)
BUN: 16 mg/dL (ref 6–20)
CO2: 24 mmol/L (ref 22–32)
Calcium: 8.9 mg/dL (ref 8.9–10.3)
Chloride: 104 mmol/L (ref 98–111)
Creatinine, Ser: 0.68 mg/dL (ref 0.61–1.24)
GFR, Estimated: >60 mL/min (ref >60)
Glucose, Bld: 99 mg/dL (ref 70–99)
Potassium: 3.9 mmol/L (ref 3.5–5.1)
Sodium: 137 mmol/L (ref 135–145)
Total Bilirubin: 0.6 mg/dL (ref 0.0–1.2)
Total Protein: 7.3 g/dL (ref 6.5–8.1)

## 2023-06-04 LAB — CBC WITH DIFFERENTIAL/PLATELET
Abs Immature Granulocytes: 0.03 10*3/uL (ref 0.00–0.07)
Basophils Absolute: 0 10*3/uL (ref 0.0–0.1)
Basophils Relative: 0 %
Eosinophils Absolute: 0 10*3/uL (ref 0.0–0.5)
Eosinophils Relative: 0 %
HCT: 48 % (ref 39.0–52.0)
Hemoglobin: 16.2 g/dL (ref 13.0–17.0)
Immature Granulocytes: 0 %
Lymphocytes Relative: 13 %
Lymphs Abs: 1.2 10*3/uL (ref 0.7–4.0)
MCH: 29.2 pg (ref 26.0–34.0)
MCHC: 33.8 g/dL (ref 30.0–36.0)
MCV: 86.5 fL (ref 80.0–100.0)
Monocytes Absolute: 0.8 10*3/uL (ref 0.1–1.0)
Monocytes Relative: 9 %
Neutro Abs: 6.8 10*3/uL (ref 1.7–7.7)
Neutrophils Relative %: 78 %
Platelets: 222 10*3/uL (ref 150–400)
RBC: 5.55 MIL/uL (ref 4.22–5.81)
RDW: 12.9 % (ref 11.5–15.5)
WBC: 8.9 10*3/uL (ref 4.0–10.5)
nRBC: 0 % (ref 0.0–0.2)

## 2023-06-04 LAB — LIPASE, BLOOD: Lipase: 38 U/L (ref 11–51)

## 2023-06-04 LAB — LACTIC ACID, PLASMA: Lactic Acid, Venous: 1 mmol/L (ref 0.5–1.9)

## 2023-06-04 MED ORDER — PIPERACILLIN-TAZOBACTAM 3.375 G IVPB 30 MIN
3.3750 g | Freq: Once | INTRAVENOUS | Status: AC
  Administered 2023-06-04: 3.375 g via INTRAVENOUS
  Filled 2023-06-04: qty 50

## 2023-06-04 MED ORDER — AMOXICILLIN-POT CLAVULANATE 875-125 MG PO TABS: 1.0000 | ORAL_TABLET | Freq: Two times a day (BID) | ORAL | 0 refills | Status: AC

## 2023-06-04 MED ORDER — SODIUM CHLORIDE 0.9 % IV BOLUS
500.0000 mL | Freq: Once | INTRAVENOUS | Status: AC
Start: 2023-06-04 — End: 2023-06-04
  Administered 2023-06-04: 500 mL via INTRAVENOUS

## 2023-06-04 MED ORDER — IOHEXOL 300 MG/ML  SOLN
100.0000 mL | Freq: Once | INTRAMUSCULAR | Status: AC | PRN
  Administered 2023-06-04: 100 mL via INTRAVENOUS

## 2023-06-04 NOTE — ED Triage Notes (Signed)
 Pt reports abd pain for 24 hours. UC did an Xray and reported he has blockage and needed to report to the ED for a stat CT. PT has a history of diverticulitis and feels that is where the pain is coming from.

## 2023-06-04 NOTE — Telephone Encounter (Signed)
 Patient called in stating he's in the ER and wanted to give [place] an update stating he is going to have a CT scan.

## 2023-06-04 NOTE — Telephone Encounter (Signed)
 Returned call to patient. Patient reports the "same diverticulitis pain". Patient is at urgent care currently, they did an x-ray and are prescribing 10 day course of Cipro and 14 day course of Flagyl. Patient has been scheduled for a follow up appt with Dr. Myrtie Neither on 06/22/23 at 2:20 pm. Patient has been advised to complete full course of antibiotics even if he starts to feel better. Patient verbalized understanding and had no concerns at the end of the call.

## 2023-06-04 NOTE — Telephone Encounter (Signed)
 Patient called and stated that he is having a flare up of diverticulitis and would like for the nurse to please return his call as soon as possible. Patient also stated that he would like for [place] send in antibiotics to the his pharmacy [facility] [address] in [place].  Please advise.

## 2023-06-04 NOTE — ED Provider Notes (Signed)
 [place] EMERGENCY DEPARTMENT AT [facility] Provider Note   CSN: [id number] Arrival date & time: 06/04/23  1153     History  Chief Complaint  Patient presents with   Abdominal Pain    Nicholas Sharp is a 58 y.o. male.  Patient is a 58 year old male who presents emergency department the chief complaint of left lower quadrant abdominal pain.  Patient notes that he was evaluated at an urgent care just prior to arrival.  He did have a abdominal x-ray performed at that time and notes that if I was concerned that he may have an obstruction.  Patient notes that he has continued to move his bowels at his baseline and is passing gas.  He has had no associated nausea or vomiting.  He notes he does have a history of diverticulitis.  He denies any associated fever, chills.  He denies any history of abdominal surgeries.   Abdominal Pain      Home Medications Prior to Admission medications   Medication Sig Start Date End Date Taking? Authorizing Provider  albuterol (VENTOLIN HFA) 108 (90 Base) MCG/ACT inhaler Inhale 2 puffs into the lungs every 6 (six) hours as needed for shortness of breath or wheezing. 05/15/23   [provider]  ALPRAZolam Prudy Feeler) 1 MG tablet Take 1 mg by mouth daily as needed.    [provider]  busPIRone (BUSPAR) 10 MG tablet Take 1 tablet (10 mg total) by mouth 2 (two) times daily as needed. Patient not taking: Reported on 06/03/2022 07/25/20   Laroy Apple M, DO  ciprofloxacin (CIPRO) 500 MG tablet Take 500 mg by mouth 2 (two) times daily. 06/04/23   [provider]  diclofenac Sodium (VOLTAREN) 1 % GEL Apply topically as needed.    [provider]  Multiple Vitamin (MULTIVITAMIN) tablet Take 1 tablet by mouth daily.    [provider]  predniSONE (DELTASONE) 20 MG tablet Take 20 mg by mouth 2 (two) times daily. 04/21/23   [provider]  promethazine-dextromethorphan (PROMETHAZINE-DM) 6.25-15 MG/5ML syrup  Take 5 mLs by mouth every 8 (eight) hours. 04/08/23   [provider]  psyllium (METAMUCIL) 58.6 % powder Take 1 packet by mouth 3 (three) times daily.    [provider]  Saccharomyces boulardii (PROBIOTIC) 250 MG CAPS Take 1 capsule by mouth daily.    [provider]  sildenafil (VIAGRA) 100 MG tablet Take 100 mg by mouth daily as needed for erectile dysfunction. 06/02/23   [provider]  tadalafil (CIALIS) 5 MG tablet Take 5 mg by mouth daily. 05/20/23   [provider]  testosterone cypionate (DEPOTESTOSTERONE CYPIONATE) 200 MG/ML injection Inject 0.3 mLs into the muscle See admin instructions. Inject 0.77mL into the muscle twice a week 06/02/23   [provider]  vitamin C (ASCORBIC ACID) 500 MG tablet Take 500 mg by mouth daily.    [provider]      Allergies    Hydrocodone    Review of Systems   Review of Systems  Gastrointestinal:  Positive for abdominal pain.  All other systems reviewed and are negative.   Physical Exam Updated Vital Signs BP (!) 130/92   Pulse 80   Temp 98.7 F (37.1 C) (Oral)   Resp 16   Ht 6\' 2"  (1.88 m)   Wt 99.8 kg   SpO2 98%   BMI 28.25 kg/m  Physical Exam Vitals and nursing note reviewed.  Constitutional:      Appearance: Normal  appearance.  HENT:     Head: Normocephalic and atraumatic.     Nose: Nose normal.     Mouth/Throat:     Mouth: Mucous membranes are moist.  Eyes:     Extraocular Movements: Extraocular movements intact.     Conjunctiva/sclera: Conjunctivae normal.     Pupils: Pupils are equal, round, and reactive to light.  Cardiovascular:     Rate and Rhythm: Normal rate and regular rhythm.     Pulses: Normal pulses.     Heart sounds: Normal heart sounds.  Pulmonary:     Effort: Pulmonary effort is normal.     Breath sounds: Normal breath sounds.  Abdominal:     General: Abdomen is flat. Bowel sounds are normal. There is no distension.     Palpations: Abdomen is  soft.     Tenderness: There is abdominal tenderness in the left lower quadrant.     Hernia: No hernia is present.  Musculoskeletal:        General: Normal range of motion.     Cervical back: Normal range of motion and neck supple.  Skin:    General: Skin is warm and dry.  Neurological:     General: No focal deficit present.     Mental Status: He is alert and oriented to person, place, and time. Mental status is at baseline.  Psychiatric:        Mood and Affect: Mood normal.        Behavior: Behavior normal.        Thought Content: Thought content normal.        Judgment: Judgment normal.     ED Results / Procedures / Treatments   Labs (all labs ordered are listed, but only abnormal results are displayed) Labs Reviewed  CBC WITH DIFFERENTIAL/PLATELET  COMPREHENSIVE METABOLIC PANEL  LIPASE, BLOOD  LACTIC ACID, PLASMA  LACTIC ACID, PLASMA    EKG None  Radiology No results found.  Procedures Procedures    Medications Ordered in ED Medications  sodium chloride 0.9 % bolus 500 mL (0 mLs Intravenous Stopped 06/04/23 1241)    ED Course/ Medical Decision Making/ A&P                                 Medical Decision Making Amount and/or Complexity of Data Reviewed Labs: ordered. Radiology: ordered.  Risk Prescription drug management.   This patient presents to the ED for concern of left-sided abdominal pain differential diagnosis includes acute diverticulitis, appendicitis, cholecystitis, bowel torsion, pyelonephritis, kidney stone, testicular torsion    Additional history obtained:  Additional history obtained from none External records from outside source obtained and reviewed including medical records   Lab Tests:  I Ordered, and personally interpreted labs.  The pertinent results include: No leukocytosis, normal lactic acid, normal kidney function liver function   Imaging Studies ordered:  I ordered imaging studies including CT scan of the abdomen and  pelvis I independently visualized and interpreted imaging which showed acute diverticulitis I agree with the radiologist interpretation   Medicines ordered and prescription drug management:  I ordered medication including Augmentin for diverticulitis Reevaluation of the patient after these medicines showed that the patient improved I have reviewed the patients home medicines and have made adjustments as needed   Problem List / ED Course:  Patient is doing well at this time and is stable for discharge home.  Discussed with patient that findings are consistent  with acute diverticulitis.  He has no indication for perforation or abscess formation.  He has no signs of bowel obstruction at this point.  No other acute surgical pathology was noted on CT scan of the abdomen and pelvis.  He does have stable vital signs with no indication for sepsis.  Patient already has close follow-up with his gastroenterologist scheduled in 2 weeks and he was directed to keep his appointment.  Strict turn precautions were discussed for any new or worsening symptoms.  Patient voiced understanding and had no additional questions.   Social Determinants of Health:  None           Final Clinical Impression(s) / ED Diagnoses Final diagnoses:  None    Rx / DC Orders ED Discharge Orders     None         Kathlen Mody 06/04/23 Elna Breslow, MD 06/06/23 1046

## 2023-06-04 NOTE — Discharge Instructions (Signed)
 Please follow-up closely with your gastroenterologist on an outpatient basis.  Return to emergency department immediately for any new or worsening symptoms.

## 2023-06-04 NOTE — ED Notes (Signed)
 Pt going to the bathroom and nurse requested to get urine sample incase. Pt stated " no, that wasn't my pain".

## 2023-06-04 NOTE — ED Notes (Addendum)
 No pain at this time but if he moves around it will hurt. No n/v noted.

## 2023-06-22 ENCOUNTER — Ambulatory Visit: Admitting: Gastroenterology

## 2023-06-22 ENCOUNTER — Encounter: Payer: Self-pay | Admitting: Gastroenterology

## 2023-06-22 VITALS — BP 100/60 | HR 88 | Ht 73.5 in | Wt 235.4 lb

## 2023-06-22 DIAGNOSIS — K5732 Diverticulitis of large intestine without perforation or abscess without bleeding: Secondary | ICD-10-CM | POA: Diagnosis not present

## 2023-06-22 DIAGNOSIS — R1032 Left lower quadrant pain: Secondary | ICD-10-CM | POA: Diagnosis not present

## 2023-06-22 MED ORDER — NA SULFATE-K SULFATE-MG SULF 17.5-3.13-1.6 GM/177ML PO SOLN: 1.0000 | Freq: Once | ORAL | 0 refills | Status: AC

## 2023-06-22 NOTE — Patient Instructions (Addendum)
 We are sending a referral to [facility]- please allow them atleast 2 weeks to reach out to you.  You have been scheduled for a colonoscopy. Please follow written instructions given to you at your visit today.   If you use inhalers (even only as needed), please bring them with you on the day of your procedure.  DO NOT TAKE 7 DAYS PRIOR TO TEST- Trulicity (dulaglutide) Ozempic, Wegovy (semaglutide) Mounjaro (tirzepatide) Bydureon Bcise (exanatide extended release)  DO NOT TAKE 1 DAY PRIOR TO YOUR TEST Rybelsus (semaglutide) Adlyxin (lixisenatide) Victoza (liraglutide) Byetta (exanatide) ___________________________________________________________________________  Bonita Quin will receive your bowel preparation through [facility], which ensures the lowest copay and home delivery, with outreach via text or call from an [phone] number. Please respond promptly to avoid rescheduling of your procedure. If you are interested in alternative options or have any questions regarding your prep, please contact them at [phone] ____________________________________________________________________________  Your Provider Has Sent Your Bowel Prep Regimen To [facility]   [facility] will contact you to verify your information and collect your copay, if applicable. Enjoy the comfort of your home while your prescription is mailed to you, FREE of any shipping charges.   [facility] accepts all major insurance benefits and applies discounts & coupons.  Have additional questions?   Chat: [email or web address] Call: [phone] Email: [email or web address] NCPDP: [id number]  How will [facility] contact you?  With a Welcome phone call,  a Welcome text and a checkout link in text form.  Texts you receive from [phone] Are NOT Spam.  *To set up delivery, you must complete the checkout process via link or speak to one of the patient care representatives. If [facility] is unable to reach you, your prescription  may be delayed.  To avoid long hold times on the phone, you may also utilize the secure chat feature on the [facility] website to request that they call you back for transaction completion or to expedite your concerns.   _______________________________________________________  If your blood pressure at your visit was 140/90 or greater, please contact your primary care physician to follow up on this.  _______________________________________________________  If you are age 69 or older, your body mass index should be between 23-30. Your Body mass index is 30.63 kg/m. If this is out of the aforementioned range listed, please consider follow up with your Primary Care Provider.  If you are age 3 or younger, your body mass index should be between 19-25. Your Body mass index is 30.63 kg/m. If this is out of the aformentioned range listed, please consider follow up with your Primary Care Provider.   ________________________________________________________  The [place] [facility] providers would like to encourage you to use [facility] to communicate with providers for non-urgent requests or questions.  Due to long hold times on the telephone, sending your provider a message by [facility] may be a faster and more efficient way to get a response.  Please allow 48 business hours for a response.  Please remember that this is for non-urgent requests.  _______________________________________________________ It was a pleasure to see you today!  Thank you for trusting me with your gastrointestinal care!

## 2023-06-22 NOTE — Progress Notes (Signed)
 [facility] Progress Note  Chief Complaint: Diverticulitis  Subjective  Prior history  Episode of complicated diverticulitis January 2021 requiring prolonged antibiotic treatment Multiple subsequent episodes of left-sided abdominal pain, some of which have been treated with antibiotics, not clear all up and diverticulitis. November 2024 call from patient after he went to urgent care for abdominal pain, was told by that provider it may be constipation.  After contacting us, I gave him an empiric 7-day course of antibiotics as he could not be seen in the office soon. Urgent care visit 06/04/2023 for same symptoms, then went immediately to [facility] emergency department with CT scan showing acute diverticulitis treated with 10 days of Augmentin Last colonoscopy with no polyps August 2018 Surgical consultation with Dr. Maisie Fus at [facility] November 2023, recommendation was nonsurgical management at that time.  _____________________  Nicholas Sharp reports that his recent diverticulitis symptoms resolved with the Augmentin treatment, but he feels that medicine does not work as fast as the usual Cipro/Flagyl regimen he has received in prior episodes.  Last night and this morning he has had a couple brief "twinges" of left-sided abdominal cramps or bloating, then it seems to have passed later in the day today.  He denies fever or rectal bleeding.  He has gone back to using fiber regularly and has not doing it 2-3 times a day to try to maintain regularity.  He is also avoiding some foods that he thinks may trigger these episodes. Naturally, he is concerned that these episodes keep happening and wondering what the long-term plan might be.  ROS: Cardiovascular:  no chest pain Respiratory: no dyspnea Mainor systems negative except as above  The patient's Past Medical, Family and Social History were reviewed and are on file in the EMR. Past Medical History:  Diagnosis Date   Acute diverticulitis 01/25/2019    ADHD (attention deficit hyperactivity disorder)    Alcohol consumption heavy    Back injury    DDD (degenerative disc disease)    Diverticulitis    ED (erectile dysfunction)    GERD (gastroesophageal reflux disease)    Hyperlipidemia    Diet controlled   Insomnia    Previously    Past Surgical History:  Procedure Laterality Date   COLONOSCOPY  10/2016   left sided diverticulosis   ESOPHAGOGASTRODUODENOSCOPY     Dr Carolan Shiver hernia, normal esophagus & esophageal bx, gastritis (no bx), normal D1/D2   ESOPHAGOGASTRODUODENOSCOPY  02/17/2012   Mild reflux esophagitis, small hh   KNEE SURGERY     x 2   right   SHOULDER SURGERY     x 2  left     Objective:  Med list reviewed  Current Outpatient Medications:    ALPRAZolam (XANAX) 1 MG tablet, Take 1 mg by mouth daily as needed., Disp: , Rfl:    Amino Acids (AMINO ACID PO), Take by mouth. Perfect Amino 1 dose daily, Disp: , Rfl:    Bacillus Coagulans-Inulin (PROBIOTIC-PREBIOTIC PO), Take 1 capsule by mouth daily., Disp: , Rfl:    Betaine, Trimethylglycine, (TMG, TRIMETHYLGLYCINE, PO), Take 3 capsules by mouth daily., Disp: , Rfl:    diclofenac Sodium (VOLTAREN) 1 % GEL, Apply topically as needed., Disp: , Rfl:    METAMUCIL FIBER PO, Take 1 Dose by mouth. 3-4 times daily, Disp: , Rfl:    MILK THISTLE PO, Take 1 capsule by mouth daily., Disp: , Rfl:    Multiple Vitamin (MULTIVITAMIN) tablet, Take 1 tablet by mouth daily., Disp: , Rfl:  Na Sulfate-K Sulfate-Mg Sulfate concentrate (SUPREP) 17.5-3.13-1.6 GM/177ML SOLN, Take 1 kit (354 mLs total) by mouth once for 1 dose., Disp: 354 mL, Rfl: 0   Omega-3 Fatty Acids (OMEGA 3 PO), Take 1 capsule by mouth daily., Disp: , Rfl:    psyllium (METAMUCIL) 58.6 % powder, Take 1 packet by mouth 3 (three) times daily., Disp: , Rfl:    tadalafil (CIALIS) 5 MG tablet, Take 5 mg by mouth daily., Disp: , Rfl:    testosterone cypionate (DEPOTESTOSTERONE CYPIONATE) 200 MG/ML injection, Inject 0.3  mLs into the muscle See admin instructions. Inject 0.28mL into the muscle twice a week, Disp: , Rfl:    TURMERIC PO, Take 1 capsule by mouth daily., Disp: , Rfl:    vitamin C (ASCORBIC ACID) 500 MG tablet, Take 500 mg by mouth daily., Disp: , Rfl:    ciprofloxacin (CIPRO) 500 MG tablet, Take 500 mg by mouth 2 (two) times daily. (Patient not taking: Reported on 06/22/2023), Disp: , Rfl:    Vital signs in last 24 hrs: Vitals:   06/22/23 1411  BP: 100/60  Pulse: 88   Wt Readings from Last 3 Encounters:  06/22/23 235 lb 6 oz (106.8 kg)  06/04/23 220 lb (99.8 kg)  06/03/22 234 lb (106.1 kg)    Physical Exam  Well-appearing HEENT: sclera anicteric, oral mucosa moist without lesions Neck: supple, no thyromegaly, JVD or lymphadenopathy Cardiac: Regular without appreciable murmur,  no peripheral edema Pulm: clear to auscultation bilaterally, normal RR and effort noted Abdomen: soft, faint LLQ tenderness to deep palpation, with active bowel sounds. No guarding or palpable hepatosplenomegaly. Skin; warm and dry, no jaundice or rash   Labs:   ___________________________________________ Radiologic studies: CLINICAL DATA:  Left lower quadrant pain since 6 p.m. last evening   EXAM: CT ABDOMEN AND PELVIS WITH CONTRAST   TECHNIQUE: Multidetector CT imaging of the abdomen and pelvis was performed using the standard protocol following bolus administration of intravenous contrast.   RADIATION DOSE REDUCTION: This exam was performed according to the departmental dose-optimization program which includes automated exposure control, adjustment of the mA and/or kV according to patient size and/or use of iterative reconstruction technique.   CONTRAST:  OMNIPAQUE IOHEXOL 300 MG/ML  SOLN   COMPARISON:  CT 05/29/2022.  Numerous older exams as well.   FINDINGS: Lower chest: There is some linear opacity lung bases likely scar or atelectasis. No pleural effusion. Slight breathing motion.  Slightly patulous lower esophagus.   Hepatobiliary: Fatty liver infiltration. Patent portal vein. Small cystic focus seen in segment 2 of the liver is unchanged from previous measuring 12 mm. This has been present since at least 2021. No imaging follow-up. Gallbladder is nondilated.   Pancreas: Unremarkable. No pancreatic ductal dilatation or surrounding inflammatory changes.   Spleen: Normal in size without focal abnormality.   Adrenals/Urinary Tract: The adrenal glands are preserved. No enhancing renal mass or collecting system dilatation. The calices have a brush like appearance consistent with tubular ectasia. At least 3 left-sided renal stones are identified measuring up to 4 mm. Punctate right-sided stones are identified. The ureters have a normal course and caliber down to the urinary bladder. Preserved contour to the urinary bladder which is underdistended.   Stomach/Bowel: No oral contrast. Along the proximal sigmoid colon is an area of significant wall thickening with stranding and scattered diverticula consistent with an area of diverticulitis. Other diverticular seen along the colon. Scattered colonic stool. No bowel obstruction. No complicating features of free air or rim  enhancing fluid collection. Normal appendix extends posterior to the cecum in the right hemipelvis. Stomach is nondilated.   Vascular/Lymphatic: Aortic atherosclerosis. No enlarged abdominal or pelvic lymph nodes.   Reproductive: Prostate is unremarkable.   Other: Small fat containing umbilical hernias.  No free air.   Musculoskeletal: Scattered degenerative changes along the spine and pelvis. Areas of disc bulging.   IMPRESSION: Area of wall thickening with inflammatory stranding along the proximal sigmoid colon consistent with an area of diverticulitis. Other diverticular seen as well. No complicating features at this time. Would recommend treatment with follow-up imaging to  confirm resolution and exclude secondary pathology.   Fatty liver infiltration.   Bilateral nonobstructing renal stones. Tubular ectasia of the renal collecting systems.     Electronically Signed   By: Karen Kays M.D.   On: 06/04/2023 18:13  ____________________________________________ Other:   _____________________________________________   Encounter Diagnoses  Name Primary?   Sigmoid diverticulitis Yes   LLQ abdominal pain    Assessment and plan:  Recurrent sigmoid diverticulitis with a recent episode treated with 10 days of Augmentin after ED visit. This has been an ongoing issue for him as noted above.  Some brief abdominal pain last night and today, though may be constipation I suspect this probably not ongoing diverticulitis.  I will hold off on any more antibiotics for now, but he was encouraged to contact us if this seems to be escalating in the next couple of days.  If so, then I will give him a course of metronidazole alone for about 7 days.  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  I think he needs to have a reevaluation by Dr. Maisie Fus for consideration of elective sigmoid resection.  To that end, I also recommended he have a colonoscopy to evaluate for any other problems and therefore clear the colon of any other concerns to help him and surgeon make a more informed decision about that.  He was agreeable after discussion of procedure and risks.  The benefits and risks of the planned procedure(s) were described in detail with the patient or (when appropriate) their health care proxy.  Risks were outlined as including, but not limited to, bleeding, infection, perforation, adverse medication reaction leading to cardiac or pulmonary decompensation, pancreatitis (if ERCP).  The limitation of incomplete mucosal visualization was also discussed.  No guarantees or  warranties were given.   30 minutes were spent on this encounter (including chart review, history/exam, counseling/coordination of care, and documentation) > 50% of that time was spent on counseling and coordination of care.   Charlie Pitter III

## 2023-06-24 ENCOUNTER — Telehealth: Payer: Self-pay

## 2023-06-24 NOTE — Telephone Encounter (Signed)
 Faxed referral to [facility] to re-evaluate diveticulitis

## 2023-06-25 NOTE — Telephone Encounter (Signed)
 Patient called requesting to speak with a nurse to provide an update if he felt things have not gotten any better with diverticulitis.

## 2023-06-25 NOTE — Telephone Encounter (Signed)
 Pt stated that he recently had Diverticulitis and was treated for that and is feeling much better. Pt stated that he feels that his bowel regiment is not what it used to be and that he feels that he is not emptying out completley and is having some bloating. Pt last BM this AM  Pt was recommended to do Miralax twice a day until a large BM and then once a day. Pt was notified to give [place] a call back in 3 or 4 days with a symptom update.  Pt verbalized understanding with all questions answered.

## 2023-06-29 ENCOUNTER — Telehealth: Payer: Self-pay | Admitting: Gastroenterology

## 2023-06-29 MED ORDER — METRONIDAZOLE 500 MG PO TABS: 500.0000 mg | ORAL_TABLET | Freq: Two times a day (BID) | ORAL | 0 refills | Status: AC

## 2023-06-29 NOTE — Telephone Encounter (Signed)
 I sent a prescription for metronidazole 500 mg tablet twice daily for 10 days  Ellwood Dense MD

## 2023-06-29 NOTE — Telephone Encounter (Signed)
 Patient advised. Thanks me for the call and assistance.

## 2023-06-29 NOTE — Telephone Encounter (Signed)
 Patient called stated he is still not doing so well and would like to know if he can start the antibiotics.

## 2023-06-29 NOTE — Telephone Encounter (Signed)
 Reports persistent LLQ discomfort and pain. "It still comes and goes." He is on Miralax and Metamucil BID. Bowel movements are smooth to formed and he empties daily. His surgical consultation is in May. Colonoscopy is scheduled with you 07/26/23. Patient wants antibiotics.   Pharmacy -[facility] [address] Okay to leave detailed voicemail if he does not answer his phone.

## 2023-07-15 ENCOUNTER — Telehealth: Payer: Self-pay

## 2023-07-15 NOTE — Telephone Encounter (Signed)
 Patient is aware

## 2023-07-15 NOTE — Telephone Encounter (Signed)
 We will hold on any further antibiotics for now. Colonoscopy as scheduled. Call on call doc over holiday weekend if symptoms escalating.  - H. Danis

## 2023-07-15 NOTE — Telephone Encounter (Signed)
 Patient calls with concerns he did not clear the diverticulitis with Flagyl 500 mg for 10 days. Assures me he did finish all the antibiotic. Felt better and became symptoms free while on Flagyl. He said the symptoms came back yesterday. He has a nagging LLQ pain that "comes and goes." Nothing worsens it or makes it better. He is having bowel movements 2 to 3 times a day. Endorses a feeling of constipation. On Miralax and Metamucil daily. He is scheduled for a colonoscopy 07/26/23. Afebrile. No nausea.

## 2023-07-26 ENCOUNTER — Encounter: Payer: Self-pay | Admitting: Gastroenterology

## 2023-07-26 ENCOUNTER — Ambulatory Visit (AMBULATORY_SURGERY_CENTER): Admitting: Gastroenterology

## 2023-07-26 VITALS — BP 124/69 | HR 77 | Temp 98.6°F | Resp 10 | Ht 73.5 in | Wt 235.6 lb

## 2023-07-26 DIAGNOSIS — K648 Other hemorrhoids: Secondary | ICD-10-CM | POA: Diagnosis not present

## 2023-07-26 DIAGNOSIS — K5732 Diverticulitis of large intestine without perforation or abscess without bleeding: Secondary | ICD-10-CM

## 2023-07-26 DIAGNOSIS — R1032 Left lower quadrant pain: Secondary | ICD-10-CM

## 2023-07-26 DIAGNOSIS — K5730 Diverticulosis of large intestine without perforation or abscess without bleeding: Secondary | ICD-10-CM

## 2023-07-26 MED ORDER — SODIUM CHLORIDE 0.9 % IV SOLN: 500.0000 mL | Freq: Once | INTRAVENOUS | Status: DC

## 2023-07-26 NOTE — Progress Notes (Signed)
 History and Physical:  This patient presents for endoscopic testing for: Encounter Diagnoses  Name Primary?   Sigmoid diverticulitis Yes   LLQ abdominal pain    Diverticulosis of colon     58 yo man well known to me with recurrent sigmoid diverticulitis. Clinical details in last office note dated 06/22/23.  Called back after that with intermittent  LLQ after Abx, and no further Abx prescribed. No abdominal pain at this point.   Past Medical History: Past Medical History:  Diagnosis Date   Acute diverticulitis 01/25/2019   ADHD (attention deficit hyperactivity disorder)    Alcohol consumption heavy    Back injury    DDD (degenerative disc disease)    Diverticulitis    ED (erectile dysfunction)    GERD (gastroesophageal reflux disease)    Hyperlipidemia    Diet controlled   Insomnia    Previously     Past Surgical History: Past Surgical History:  Procedure Laterality Date   COLONOSCOPY  10/2016   left sided diverticulosis   ESOPHAGOGASTRODUODENOSCOPY     Dr Susa Engman hernia, normal esophagus & esophageal bx, gastritis (no bx), normal D1/D2   ESOPHAGOGASTRODUODENOSCOPY  02/17/2012   Mild reflux esophagitis, small hh   KNEE SURGERY     x 2   right   SHOULDER SURGERY     x 2  left    Allergies: Allergies  Allergen Reactions   Hydrocodone  Other (See Comments)    Felt really weird and strange    Outpatient Meds: Current Outpatient Medications  Medication Sig Dispense Refill   ALPRAZolam  (XANAX ) 1 MG tablet Take 1 mg by mouth daily as needed.     Amino Acids (AMINO ACID PO) Take by mouth. Perfect Amino 1 dose daily     Bacillus Coagulans-Inulin (PROBIOTIC-PREBIOTIC PO) Take 1 capsule by mouth daily.     Betaine, Trimethylglycine, (TMG, TRIMETHYLGLYCINE, PO) Take 3 capsules by mouth daily.     ciprofloxacin  (CIPRO ) 500 MG tablet Take 500 mg by mouth 2 (two) times daily. (Patient not taking: Reported on 06/22/2023)     diclofenac  Sodium (VOLTAREN ) 1 % GEL Apply  topically as needed.     METAMUCIL FIBER PO Take 1 Dose by mouth. 3-4 times daily     MILK THISTLE PO Take 1 capsule by mouth daily.     Multiple Vitamin (MULTIVITAMIN) tablet Take 1 tablet by mouth daily.     Omega-3 Fatty Acids (OMEGA 3 PO) Take 1 capsule by mouth daily.     psyllium (METAMUCIL) 58.6 % powder Take 1 packet by mouth 3 (three) times daily.     tadalafil (CIALIS) 5 MG tablet Take 5 mg by mouth daily.     testosterone  cypionate (DEPOTESTOSTERONE CYPIONATE) 200 MG/ML injection Inject 0.3 mLs into the muscle See admin instructions. Inject 0.3mL into the muscle twice a week     TURMERIC PO Take 1 capsule by mouth daily.     vitamin C (ASCORBIC ACID) 500 MG tablet Take 500 mg by mouth daily.     Current Facility-Administered Medications  Medication Dose Route Frequency Provider Last Rate Last Admin   0.9 %  sodium chloride  infusion  500 mL Intravenous Once Danis, Danuta Huseman L III, MD          ___________________________________________________________________ Objective   Exam:  BP 126/64   Pulse 78   Temp 98.6 F (37 C) (Temporal)   Ht 6' 1.5" (1.867 m)   Wt 235 lb 9.6 oz (106.9 kg)   SpO2 98%  BMI 30.66 kg/m   CV: regular , S1/S2 Resp: clear to auscultation bilaterally, normal RR and effort noted GI: soft, no tenderness, with active bowel sounds.   Assessment: Encounter Diagnoses  Name Primary?   Sigmoid diverticulitis Yes   LLQ abdominal pain    Diverticulosis of colon      Plan: Colonoscopy   The benefits and risks of the planned procedure(s) were described in detail with the patient or (when appropriate) their health care proxy.  Risks were outlined as including, but not limited to, bleeding, infection, perforation, adverse medication reaction leading to cardiac or pulmonary decompensation, pancreatitis (if ERCP).  The limitation of incomplete mucosal visualization was also discussed.  No guarantees or warranties were given.  The patient is appropriate  for an endoscopic procedure in the ambulatory setting.   - Lorella Roles, MD

## 2023-07-26 NOTE — Progress Notes (Signed)
 Pt's states no medical or surgical changes since previsit or office visit.

## 2023-07-26 NOTE — Patient Instructions (Signed)
 Handouts provided on diverticulosis and hemorrhoids.  Resume previous diet.  Continue present medications.  Repeat colonoscopy in 10 years for screening purposes.  Attend upcoming appointment with the colon and rectal surgeon.   YOU HAD AN ENDOSCOPIC PROCEDURE TODAY AT THE [facility]:   Refer to the procedure report that was given to you for any specific questions about what was found during the examination.  If the procedure report does not answer your questions, please call your gastroenterologist to clarify.  If you requested that your care partner not be given the details of your procedure findings, then the procedure report has been included in a sealed envelope for you to review at your convenience later.  YOU SHOULD EXPECT: Some feelings of bloating in the abdomen. Passage of more gas than usual.  Walking can help get rid of the air that was put into your GI tract during the procedure and reduce the bloating. If you had a lower endoscopy (such as a colonoscopy or flexible sigmoidoscopy) you may notice spotting of blood in your stool or on the toilet paper. If you underwent a bowel prep for your procedure, you may not have a normal bowel movement for a few days.  Please Note:  You might notice some irritation and congestion in your nose or some drainage.  This is from the oxygen used during your procedure.  There is no need for concern and it should clear up in a day or so.  SYMPTOMS TO REPORT IMMEDIATELY:  Following lower endoscopy (colonoscopy or flexible sigmoidoscopy):  Excessive amounts of blood in the stool  Significant tenderness or worsening of abdominal pains  Swelling of the abdomen that is new, acute  Fever of 100F or higher  For urgent or emergent issues, a gastroenterologist can be reached at any hour by calling [phone]. Do not use MyChart messaging for urgent concerns.    DIET:  We do recommend a small meal at first, but then you may proceed to your  regular diet.  Drink plenty of fluids but you should avoid alcoholic beverages for 24 hours.  ACTIVITY:  You should plan to take it easy for the rest of today and you should NOT DRIVE or use heavy machinery until tomorrow (because of the sedation medicines used during the test).    FOLLOW UP: Our staff will call the number listed on your records the next business day following your procedure.  We will call around 7:15- 8:00 am to check on you and address any questions or concerns that you may have regarding the information given to you following your procedure. If we do not reach you, we will leave a message.     If any biopsies were taken you will be contacted by phone or by letter within the next 1-3 weeks.  Please call us  at [phone] if you have not heard about the biopsies in 3 weeks.    SIGNATURES/CONFIDENTIALITY: You and/or your care partner have signed paperwork which will be entered into your electronic medical record.  These signatures attest to the fact that that the information above on your After Visit Summary has been reviewed and is understood.  Full responsibility of the confidentiality of this discharge information lies with you and/or your care-partner.

## 2023-07-26 NOTE — Op Note (Signed)
 [facility] Patient Name: Nicholas Sharp Procedure Date: 07/26/2023 3:35 PM MRN: [id number] Endoscopist: Ace Abu L. Dominic Friendly , MD, [id number] Age: 58 Referring MD:  Date of Birth: 1965-05-20 Gender: Male Account #: [id number] Procedure:                Colonoscopy Indications:              Abdominal pain in the left lower quadrant,                            Diverticulitis (recurrent)                           clinical details in recent office note Medicines:                Monitored Anesthesia Care Procedure:                Pre-Anesthesia Assessment:                           - Prior to the procedure, a History and Physical                            was performed, and patient medications and                            allergies were reviewed. The patient's tolerance of                            previous anesthesia was also reviewed. The risks                            and benefits of the procedure and the sedation                            options and risks were discussed with the patient.                            All questions were answered, and informed consent                            was obtained. Prior Anticoagulants: The patient has                            taken no anticoagulant or antiplatelet agents. ASA                            Grade Assessment: II - A patient with mild systemic                            disease. After reviewing the risks and benefits,                            the patient was deemed in satisfactory condition to  undergo the procedure.                           After obtaining informed consent, the colonoscope                            was passed under direct vision. Throughout the                            procedure, the patient's blood pressure, pulse, and                            oxygen saturations were monitored continuously. The                            Olympus Scope SN [id number] was introduced through  the                            anus and advanced to the the terminal ileum, with                            identification of the appendiceal orifice and IC                            valve. The colonoscopy was performed without                            difficulty. The patient tolerated the procedure                            well. The quality of the bowel preparation was                            good. The terminal ileum, ileocecal valve,                            appendiceal orifice, and rectum were photographed. Scope In: 3:48:46 PM Scope Out: 4:01:28 PM Scope Withdrawal Time: 0 hours 9 minutes 39 seconds  Total Procedure Duration: 0 hours 12 minutes 42 seconds  Findings:                 The perianal and digital rectal examinations were                            normal.                           The terminal ileum appeared normal.                           Multiple diverticula were found in the left colon.                            No mucosal inflammation, purulent discharge or  stricture.                           Internal hemorrhoids were found. The hemorrhoids                            were small.                           The exam was otherwise without abnormality on                            direct and retroflexion views. Complications:            No immediate complications. Estimated Blood Loss:     Estimated blood loss: none. Impression:               - The examined portion of the ileum was normal.                           - Diverticulosis in the left colon.                           - Internal hemorrhoids.                           - The examination was otherwise normal on direct                            and retroflexion views.                           - No specimens collected. Recommendation:           - Patient has a contact number available for                            emergencies. The signs and symptoms of potential                             delayed complications were discussed with the                            patient. Return to normal activities tomorrow.                            Written discharge instructions were provided to the                            patient.                           - Resume previous diet.                           - Continue present medications.                           -  Repeat colonoscopy in 10 years for screening                            purposes. (replace any existing recall)                           - Attend upcoming appointment with the colon and                            rectal surgeon Ace Abu L. Dominic Friendly, MD 07/26/2023 4:06:22 PM This report has been signed electronically.

## 2023-07-26 NOTE — Progress Notes (Signed)
 Sedate, gd SR, tolerated procedure well, VSS, report to RN

## 2023-07-27 ENCOUNTER — Telehealth: Payer: Self-pay | Admitting: *Deleted

## 2023-07-27 NOTE — Telephone Encounter (Signed)
  Follow up Call-     07/26/2023    2:37 PM  Call back number  Post procedure Call Back phone  # [phone]  Permission to leave phone message Yes    Post procedure follow up phone call. No answer at number given.  Left message on voicemail.

## 2024-05-03 ENCOUNTER — Other Ambulatory Visit (HOSPITAL_BASED_OUTPATIENT_CLINIC_OR_DEPARTMENT_OTHER): Payer: Self-pay

## 2024-05-03 ENCOUNTER — Ambulatory Visit
Admission: RE | Admit: 2024-05-03 | Discharge: 2024-05-03 | Disposition: A | Discharge status: Home / Self Care | Source: Ambulatory Visit

## 2024-05-03 ENCOUNTER — Other Ambulatory Visit: Payer: Self-pay

## 2024-05-03 VITALS — BP 102/70 | HR 101 | Temp 98.6°F | Resp 20

## 2024-05-03 DIAGNOSIS — J101 Influenza due to other identified influenza virus with other respiratory manifestations: Secondary | ICD-10-CM

## 2024-05-03 DIAGNOSIS — R0602 Shortness of breath: Secondary | ICD-10-CM | POA: Diagnosis not present

## 2024-05-03 DIAGNOSIS — J209 Acute bronchitis, unspecified: Secondary | ICD-10-CM

## 2024-05-03 LAB — POC COVID19/FLU A&B COMBO
Covid Antigen, POC: NEGATIVE
Influenza A Antigen, POC: NEGATIVE
Influenza B Antigen, POC: POSITIVE — AB

## 2024-05-03 MED ORDER — OSELTAMIVIR PHOSPHATE 75 MG PO CAPS
75.0000 mg | ORAL_CAPSULE | Freq: Two times a day (BID) | ORAL | 0 refills | Status: AC
  Filled 2024-05-03 (×2): qty 8, 4d supply, fill #0

## 2024-05-03 MED ORDER — PROMETHAZINE-DM 6.25-15 MG/5ML PO SYRP
5.0000 mL | ORAL_SOLUTION | Freq: Every evening | ORAL | 0 refills | Status: AC | PRN
  Filled 2024-05-03: qty 118, 23d supply, fill #0

## 2024-05-03 MED ORDER — GUAIFENESIN ER 1200 MG PO TB12
1200.0000 mg | ORAL_TABLET | Freq: Two times a day (BID) | ORAL | 0 refills | Status: AC
  Filled 2024-05-03: qty 14, 7d supply, fill #0

## 2024-05-03 MED ORDER — DEXAMETHASONE SOD PHOSPHATE PF 10 MG/ML IJ SOLN
10.0000 mg | Freq: Once | INTRAMUSCULAR | Status: AC
  Administered 2024-05-03: 10 mg via INTRAMUSCULAR

## 2024-05-03 MED ORDER — ALBUTEROL SULFATE HFA 108 (90 BASE) MCG/ACT IN AERS
2.0000 | INHALATION_SPRAY | Freq: Four times a day (QID) | RESPIRATORY_TRACT | 0 refills | Status: AC | PRN
  Filled 2024-05-03: qty 6.7, 25d supply, fill #0

## 2024-05-03 NOTE — ED Triage Notes (Addendum)
 Pt reports nasal congestion, chest congestion, fatigue,fever, cough, general malaise x2 days. Has been seen at other UC and was started on Tamiflu  and Tessalon  Perles. Denies any improvement of symptoms. Reports covid/flu negative on Monday.

## 2024-05-03 NOTE — Discharge Instructions (Signed)
 You have the flu. Take Tamiflu  every 12 hours for the next 5 days to improve symptoms and stop the virus from replicating in your body. Ibuprofen/tylenol  as needed for fevers and body aches. Mucinex  as needed for nasal congestion.  We gave you a steroid injection today (dexamethasone  10mg ) and this will continue to work in your body over the next 2-3 days to reduce inflammation to the lungs.  Take promethazine  DM at bedtime as needed for coughing.  0.8 you sleepy so mostly take this at bedtime and do not drive while taking this.  Use albuterol  inhaler 2 puffs every 4-6 hours as needed for shortness of breath and wheezing.  If you develop any new or worsening symptoms or if your symptoms do not start to improve, please return here or follow-up with your primary care provider. If your symptoms are severe, please go to the emergency room.

## 2024-05-03 NOTE — ED Provider Notes (Signed)
 " [facility]    CSN: [id number] Arrival date & time: 05/03/24  1254      History   Chief Complaint Chief Complaint  Patient presents with   Cough    nasal congestion, fever, x 2 - Entered by patient    HPI Nicholas Sharp is a 59 y.o. male.   Nicholas Sharp is a 59 y.o. male presenting for chief complaint of cough, nasal congestion, sore throat, generalized fatigue, and bodyaches that started 2 days ago on May 01, 2024.  He was seen at another clinic in town where no provider is available onsite for assessment and he was seen via telemedicine.  Tech at other urgent care clinic swabbed for COVID and flu and both were negative.  He was prescribed Tamiflu  to be taken once daily and has been taking this as prescribed for the last 2 days without much relief of symptoms.  Denies nausea, vomiting, diarrhea, abdominal pain, fever, and dizziness.  He started feeling a little short of breath with cough today.  Denies history of chronic respiratory problems like COPD or asthma.  Never smoker.  He has not noticed any noisy breathing to his chest but feels chest congestion.  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  Taking Benadryl without much relief of symptoms.   Cough   Past Medical History:  Diagnosis Date   Acute diverticulitis 01/25/2019   ADHD (attention deficit hyperactivity disorder)    Alcohol consumption heavy    Back injury    DDD (degenerative disc disease)    Diverticulitis    ED (erectile dysfunction)    GERD (gastroesophageal reflux disease)    Hyperlipidemia    Diet controlled   Insomnia    Previously    Patient Active Problem List   Diagnosis Date Noted   Generalized anxiety disorder 02/14/2020   Herpes zoster without complication 02/07/2020   Local skin infection 01/25/2020   Sore throat 01/25/2020   Left lower quadrant abdominal pain     Diverticulitis of colon with perforation 01/26/2019   Precordial chest pain 07/18/2014   Insomnia    Alcohol consumption heavy    Rosacea 10/29/2013   Essential hypertension, benign 05/04/2013   ADHD (attention deficit hyperactivity disorder) 05/04/2013   Hypotestosteronism 08/01/2012   Other and unspecified hyperlipidemia 08/01/2012   Atypical chest pain 02/12/2012   GERD (gastroesophageal reflux disease) 02/12/2012    Past Surgical History:  Procedure Laterality Date   COLONOSCOPY  10/2016   left sided diverticulosis   ESOPHAGOGASTRODUODENOSCOPY     Dr Buck hernia, normal esophagus & esophageal bx, gastritis (no bx), normal D1/D2   ESOPHAGOGASTRODUODENOSCOPY  02/17/2012   Mild reflux esophagitis, small hh   KNEE SURGERY     x 2   right   SHOULDER SURGERY     x 2  left       Home Medications    Prior to Admission medications  Medication Sig Start Date End Date Taking? Authorizing Provider  albuterol  (VENTOLIN  HFA) 108 (90 Base) MCG/ACT inhaler Inhale 2 puffs into the lungs every 6 (six) hours as needed for wheezing or shortness of breath. 05/03/24  Yes Enedelia Dorna HERO, FNP  Guaifenesin  1200 MG TB12 Take 1 tablet (1,200 mg total) by mouth in the morning and at bedtime. 05/03/24  Yes Enedelia Dorna HERO, FNP  oseltamivir  (TAMIFLU ) 75 MG capsule Take 1 capsule (75 mg total) by mouth  every 12 (twelve) hours for 4 days. 05/03/24 05/07/24 Yes Enedelia Dorna HERO, FNP  promethazine -dextromethorphan (PROMETHAZINE -DM) 6.25-15 MG/5ML syrup Take 5 mLs by mouth at bedtime as needed for cough. 05/03/24  Yes Enedelia Dorna HERO, FNP  ALPRAZolam  (XANAX ) 1 MG tablet Take 1 mg by mouth daily as needed.    [provider]  Amino Acids (AMINO ACID PO) Take by mouth. Perfect Amino 1 dose daily    [provider]  Bacillus Coagulans-Inulin (PROBIOTIC-PREBIOTIC PO) Take 1 capsule by mouth daily.    [provider]  Betaine, Trimethylglycine, (TMG,  TRIMETHYLGLYCINE, PO) Take 3 capsules by mouth daily.    [provider]  ciprofloxacin  (CIPRO ) 500 MG tablet Take 500 mg by mouth 2 (two) times daily. Patient not taking: Reported on 06/22/2023 06/04/23   [provider]  diclofenac  Sodium (VOLTAREN ) 1 % GEL Apply topically as needed.    [provider]  METAMUCIL FIBER PO Take 1 Dose by mouth. 3-4 times daily    [provider]  MILK THISTLE PO Take 1 capsule by mouth daily.    [provider]  Multiple Vitamin (MULTIVITAMIN) tablet Take 1 tablet by mouth daily.    [provider]  Omega-3 Fatty Acids (OMEGA 3 PO) Take 1 capsule by mouth daily.    [provider]  psyllium (METAMUCIL) 58.6 % powder Take 1 packet by mouth 3 (three) times daily.    [provider]  tadalafil (CIALIS) 5 MG tablet Take 5 mg by mouth daily. 05/20/23   [provider]  testosterone  cypionate (DEPOTESTOSTERONE CYPIONATE) 200 MG/ML injection Inject 0.3 mLs into the muscle See admin instructions. Inject 0.3mL into the muscle twice a week 06/02/23   [provider]  TURMERIC PO Take 1 capsule by mouth daily.    [provider]  vitamin C (ASCORBIC ACID) 500 MG tablet Take 500 mg by mouth daily.    [provider]    Family History Family History  Problem Relation Age of Onset   Diabetes Mother    Dementia Mother    Congestive Heart Failure Mother    Irritable bowel syndrome Mother    Diverticulosis Mother    Hypertension Father    Depression Sister    Diverticulitis Sister    Colon cancer Neg Hx    Esophageal cancer Neg Hx    Rectal cancer Neg Hx    Stomach cancer Neg Hx    Pancreatic cancer Neg Hx    Prostate cancer Neg Hx     Social History Social History[1]   Allergies   Hydrocodone    Review of Systems Review of Systems  Respiratory:  Positive for cough.   Per HPI   Physical Exam Triage Vital Signs ED Triage Vitals  Encounter Vitals  Group     BP 05/03/24 1321 102/70     Girls Systolic BP Percentile --      Girls Diastolic BP Percentile --      Boys Systolic BP Percentile --      Boys Diastolic BP Percentile --      Pulse Rate 05/03/24 1321 (!) 101     Resp 05/03/24 1321 20     Temp 05/03/24 1321 98.6 F (37 C)     Temp Source 05/03/24 1321 Oral     SpO2 05/03/24 1321 95 %     Weight --      Height --      Head Circumference --      Peak  Flow --      Pain Score 05/03/24 1325 5     Pain Loc --      Pain Education --      Exclude from Growth Chart --    No data found.  Updated Vital Signs BP 102/70 (BP Location: Right Arm)   Pulse (!) 101   Temp 98.6 F (37 C) (Oral)   Resp 20   SpO2 95%   Visual Acuity Right Eye Distance:   Left Eye Distance:   Bilateral Distance:    Right Eye Near:   Left Eye Near:    Bilateral Near:     Physical Exam Vitals and nursing note reviewed.  Constitutional:      Appearance: He is not ill-appearing or toxic-appearing.  HENT:     Head: Normocephalic and atraumatic.     Right Ear: Hearing, tympanic membrane, ear canal and external ear normal.     Left Ear: Hearing, tympanic membrane, ear canal and external ear normal.     Nose: Congestion present.     Mouth/Throat:     Lips: Pink.     Mouth: Mucous membranes are moist. No injury or oral lesions.     Dentition: Normal dentition.     Tongue: No lesions.     Pharynx: Oropharynx is clear. Uvula midline. No pharyngeal swelling, oropharyngeal exudate, posterior oropharyngeal erythema, uvula swelling or postnasal drip.     Tonsils: No tonsillar exudate.  Eyes:     General: Lids are normal. Vision grossly intact. Gaze aligned appropriately.     Extraocular Movements: Extraocular movements intact.     Conjunctiva/sclera: Conjunctivae normal.  Neck:     Trachea: Trachea and phonation normal.  Cardiovascular:     Rate and Rhythm: Normal rate and regular rhythm.     Heart sounds: Normal heart sounds, S1 normal and S2  normal.  Pulmonary:     Effort: Pulmonary effort is normal. No respiratory distress.     Breath sounds: Normal breath sounds and air entry. No wheezing, rhonchi or rales.     Comments: Speaking in full sentences without difficulty or increased respiratory effort.  Coarse breath sounds heard all lung fields bilaterally. Chest:     Chest wall: No tenderness.  Musculoskeletal:     Cervical back: Neck supple.  Lymphadenopathy:     Cervical: No cervical adenopathy.  Skin:    General: Skin is warm and dry.     Capillary Refill: Capillary refill takes less than 2 seconds.     Findings: No rash.  Neurological:     General: No focal deficit present.     Mental Status: He is alert and oriented to person, place, and time. Mental status is at baseline.     Cranial Nerves: No dysarthria or facial asymmetry.  Psychiatric:        Mood and Affect: Mood normal.        Speech: Speech normal.        Behavior: Behavior normal.        Thought Content: Thought content normal.        Judgment: Judgment normal.      UC Treatments / Results  Labs (all labs ordered are listed, but only abnormal results are displayed) Labs Reviewed  POC COVID19/FLU A&B COMBO - Abnormal; Notable for the following components:      Result Value   Influenza B Antigen, POC Positive (*)    All other components within normal limits    EKG  Radiology No results found.  Procedures Procedures (including critical care time)  Medications Ordered in UC Medications  dexamethasone  (DECADRON ) injection 10 mg (10 mg Intramuscular Given 05/03/24 1408)    Initial Impression / Assessment and Plan / UC Course  I have reviewed the triage vital signs and the nursing notes.  Pertinent labs & imaging results that were available during my care of the patient were reviewed by me and considered in my medical decision making (see chart for details).   1.  Influenza B, shortness of breath, acute bronchitis Repeated COVID-19 and  influenza testing in clinic today.   The patient tested positive for influenza B. He is taking Tamiflu  once daily, I would like for him to take this twice daily for 5 days. He has already had the medication once daily for 2 days, therefore I have sent in 4 days worth of twice daily dosing.  Dexamethasone  10 mg IM given to treat inflammation to the lungs/bronchitis. Albuterol  inhaler 2 puffs every 4-6 hours as needed for shortness of breath and wheezing prescribed. Deferred imaging of the chest given stable cardiopulmonary exam/nonfocal on exam.  Prescriptions for supportive care and further symptomatic relief sent to pharmacy. Advised to drink plenty of fluids to stay well-hydrated.  Work note given.  Counseled patient on potential for adverse effects with medications prescribed/recommended today, strict ER and return-to-clinic precautions discussed, patient verbalized understanding.    Final Clinical Impressions(s) / UC Diagnoses   Final diagnoses:  Influenza B  Shortness of breath  Acute bronchitis, unspecified organism     Discharge Instructions      You have the flu. Take Tamiflu  every 12 hours for the next 5 days to improve symptoms and stop the virus from replicating in your body. Ibuprofen/tylenol  as needed for fevers and body aches. Mucinex  as needed for nasal congestion.  We gave you a steroid injection today (dexamethasone  10mg ) and this will continue to work in your body over the next 2-3 days to reduce inflammation to the lungs.  Take promethazine  DM at bedtime as needed for coughing.  0.8 you sleepy so mostly take this at bedtime and do not drive while taking this.  Use albuterol  inhaler 2 puffs every 4-6 hours as needed for shortness of breath and wheezing.  If you develop any new or worsening symptoms or if your symptoms do not start to improve, please return here or follow-up with your primary care provider. If your symptoms are severe, please go to the  emergency room.     ED Prescriptions     Medication Sig Dispense Auth. Provider   Guaifenesin  1200 MG TB12 Take 1 tablet (1,200 mg total) by mouth in the morning and at bedtime. 14 tablet Enedelia Dorna HERO, FNP   promethazine -dextromethorphan (PROMETHAZINE -DM) 6.25-15 MG/5ML syrup Take 5 mLs by mouth at bedtime as needed for cough. 118 mL Enedelia Dorna M, FNP   albuterol  (VENTOLIN  HFA) 108 (90 Base) MCG/ACT inhaler Inhale 2 puffs into the lungs every 6 (six) hours as needed for wheezing or shortness of breath. 18 g Kenora Spayd M, FNP   oseltamivir  (TAMIFLU ) 75 MG capsule Take 1 capsule (75 mg total) by mouth every 12 (twelve) hours for 4 days. 8 capsule Pola Furno M, FNP      PDMP not reviewed this encounter.    [1]  Social History Tobacco Use   Smoking status: Never   Smokeless tobacco: Never  Vaping Use   Vaping status: Never Used  Substance Use Topics  Alcohol use: Yes    Alcohol/week: 15.0 standard drinks of alcohol    Types: 15 Cans of beer per week    Comment: several beers/wk   Drug use: No     Enedelia Dorna HERO, FNP 05/03/24 1439  "
